# Patient Record
Sex: Male | Born: 1960 | Race: White | Hispanic: No | Marital: Married | Smoking: Never smoker
Health system: Southern US, Community
[De-identification: ages and names within clinical notes are randomized; demographics above are authoritative.]

## PROBLEM LIST (undated history)

## (undated) DIAGNOSIS — R319 Hematuria, unspecified: Secondary | ICD-10-CM

## (undated) DIAGNOSIS — G5602 Carpal tunnel syndrome, left upper limb: Secondary | ICD-10-CM

## (undated) DIAGNOSIS — Z9889 Other specified postprocedural states: Secondary | ICD-10-CM

## (undated) DIAGNOSIS — I77810 Thoracic aortic ectasia: Secondary | ICD-10-CM

## (undated) DIAGNOSIS — R112 Nausea with vomiting, unspecified: Secondary | ICD-10-CM

## (undated) DIAGNOSIS — K56609 Unspecified intestinal obstruction, unspecified as to partial versus complete obstruction: Secondary | ICD-10-CM

## (undated) DIAGNOSIS — T4145XA Adverse effect of unspecified anesthetic, initial encounter: Secondary | ICD-10-CM

## (undated) DIAGNOSIS — Z98811 Dental restoration status: Secondary | ICD-10-CM

## (undated) DIAGNOSIS — G4730 Sleep apnea, unspecified: Secondary | ICD-10-CM

## (undated) DIAGNOSIS — T8859XA Other complications of anesthesia, initial encounter: Secondary | ICD-10-CM

## (undated) DIAGNOSIS — N400 Enlarged prostate without lower urinary tract symptoms: Secondary | ICD-10-CM

## (undated) DIAGNOSIS — Z8719 Personal history of other diseases of the digestive system: Secondary | ICD-10-CM

## (undated) DIAGNOSIS — R51 Headache: Secondary | ICD-10-CM

## (undated) DIAGNOSIS — K219 Gastro-esophageal reflux disease without esophagitis: Secondary | ICD-10-CM

## (undated) DIAGNOSIS — I719 Aortic aneurysm of unspecified site, without rupture: Secondary | ICD-10-CM

## (undated) DIAGNOSIS — R079 Chest pain, unspecified: Secondary | ICD-10-CM

## (undated) HISTORY — DX: Unspecified intestinal obstruction, unspecified as to partial versus complete obstruction: K56.609

## (undated) HISTORY — DX: Chest pain, unspecified: R07.9

## (undated) HISTORY — PX: CHOLECYSTECTOMY: SHX55

## (undated) HISTORY — PX: SHOULDER SURGERY: SHX246

## (undated) HISTORY — PX: APPENDECTOMY: SHX54

## (undated) HISTORY — DX: Thoracic aortic ectasia: I77.810

---

## 1999-10-29 ENCOUNTER — Ambulatory Visit (HOSPITAL_COMMUNITY): Admission: RE | Admit: 1999-10-29 | Discharge: 1999-10-30 | Payer: Self-pay | Admitting: Neurological Surgery

## 1999-10-29 ENCOUNTER — Encounter: Payer: Self-pay | Admitting: Neurological Surgery

## 1999-10-29 HISTORY — PX: LUMBAR LAMINECTOMY/DECOMPRESSION MICRODISCECTOMY: SHX5026

## 1999-11-27 ENCOUNTER — Encounter: Admission: RE | Admit: 1999-11-27 | Discharge: 1999-11-27 | Payer: Self-pay | Admitting: Neurological Surgery

## 1999-11-27 ENCOUNTER — Encounter: Payer: Self-pay | Admitting: Neurological Surgery

## 1999-12-05 ENCOUNTER — Ambulatory Visit (HOSPITAL_COMMUNITY): Admission: RE | Admit: 1999-12-05 | Discharge: 1999-12-05 | Payer: Self-pay | Admitting: Neurological Surgery

## 1999-12-05 ENCOUNTER — Encounter: Payer: Self-pay | Admitting: Neurological Surgery

## 2001-07-07 ENCOUNTER — Encounter (INDEPENDENT_AMBULATORY_CARE_PROVIDER_SITE_OTHER): Payer: Self-pay | Admitting: *Deleted

## 2001-07-07 ENCOUNTER — Ambulatory Visit (HOSPITAL_BASED_OUTPATIENT_CLINIC_OR_DEPARTMENT_OTHER): Admission: RE | Admit: 2001-07-07 | Discharge: 2001-07-07 | Payer: Self-pay | Admitting: Otolaryngology

## 2001-07-07 HISTORY — PX: DIRECT LARYNGOSCOPY: SHX5326

## 2001-12-15 ENCOUNTER — Ambulatory Visit (HOSPITAL_COMMUNITY): Admission: RE | Admit: 2001-12-15 | Discharge: 2001-12-15 | Payer: Self-pay | Admitting: Orthopedic Surgery

## 2001-12-15 ENCOUNTER — Encounter: Payer: Self-pay | Admitting: Orthopedic Surgery

## 2006-11-23 ENCOUNTER — Ambulatory Visit: Payer: Self-pay | Admitting: Family Medicine

## 2010-10-16 NOTE — Op Note (Signed)
[place]. [facility]  Patient:    Jonathan Burke, Jonathan Burke Visit Number: [id number] MRN: [id number]          Service Type: DSU Location: [facility] Attending Physician:  Lucky Cowboy Dictated by:   Lucky Cowboy, M.D. Proc. Date: 07/07/01 Admit Date:  07/07/2001   CC:         [facility]  Colon Flattery, D.O., [place], [place]   Operative Report  PREOPERATIVE DIAGNOSIS: Left vocal cord mass.  POSTOPERATIVE DIAGNOSIS: Left vocal cord mass.  OPERATION/PROCEDURE: Suspension microdirect laryngoscopy with excision of left vocal cord mass.  SURGEON: Lucky Cowboy, M.D.  ANESTHESIA: General endotracheal anesthesia.  ESTIMATED BLOOD LOSS/COMPLICATIONS: None.  INDICATIONS FOR PROCEDURE: This patient is a 50 year old male, who has experienced significant hoarseness for seven months.  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  After reflux was controlled with Nexium bed, excision is performed.  FINDINGS: The patient was noted to have a moderately sized left anterior vocal cord, hemorrhagic polyp which had overlying leukoplakia.  This was very broad-based.  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  DESCRIPTION OF PROCEDURE: The patient was taken to the operating room and placed on the table in the supine position.  He was then placed under general endotracheal anesthesia and the table rotated counterclockwise 90 degrees. The neck was gently extended using a shoulder roll.  The eyes were taped shut and the head and body draped.  A Dedo adult laryngoscope was then placed intraorally with exposure of the endolarynx.  This was then suspended on the Lewy rigid suspension system.  The microscope was then aimed down the barrel of the laryngoscope for better visualization.  Epinephrine on a cottonoid pledget was placed against the left vocal cord.  At this point, the  polyp was grasped with a forceps and reflected medially.  A knife was then used to incise the mucosa adjacent to the polypoid mass.  Anteriorly, scissors were required to resect the last small portion of mucosa.  A small amount of bleeding occurred which was controlled using epinephrine on a pledget.  The laryngoscope was removed.  Please note that the patient had lidocaine after induction prior to intubation.  The table was rotated clockwise 90 degrees to its original position.  There was no damage to the teeth or soft tissues. Please note that an upper dental guard was used for protection of the incisors.  The patient was awakened from anesthesia and taken to the post anesthesia care unit in stable condition.  There were no complications. Dictated by:   Lucky Cowboy, M.D. Attending Physician:  Lucky Cowboy DD:  07/07/01 TD:  07/07/01 Job: [id number]

## 2010-10-16 NOTE — Discharge Summary (Signed)
[place]. [facility]  Patient:    Jonathan Burke, Jonathan Burke                       MRN: [id number] Adm. Date:  [id number] Disc. Date: [id number] Attending:  Jonne Ply                           Discharge Summary  ADMITTING DIAGNOSIS:  Herniated nucleus pulposus L5-S1 right with right lumbar radiculopathy.  DISCHARGE DIAGNOSIS:  Herniated nucleus pulposus L5-S1 right with right lumbar radiculopathy.  OPERATION:  Lumbar microdiskectomy L5-S1 right with microdiskectomy using microdissection technique.  CONDITION ON DISCHARGE:  Improved.  HOSPITAL COURSE:  The patient is a 50 year old individual who has had severe back and right lower extremity pain.  He had a herniated nucleus pulposus demonstrated on a myelogram done on the day of admission.  He was advised regarding surgery and was taken to the operating room on the evening of Oct 29, 1999.  He underwent laminotomy and microdiskectomy, tolerated the procedure well, and his leg pain is greatly relieved.  His incision is clean and dry, and he has been advised as to his postoperative activities.  He will be seen in the office in about a months time.  He was given a prescription for Vicodin for pain and Xanax as a muscle relaxer. DD:  10/30/99 TD:  11/02/99 Job: [id number]

## 2010-10-16 NOTE — Op Note (Signed)
[place]. [facility]  Patient:    Jonathan Burke, Jonathan Burke                       MRN: [id number] Proc. Date: 10/29/99 Adm. Date:  [id number] Disc. Date: [id number] Attending:  Jonne Ply                           Operative Report  PREOPERATIVE DIAGNOSIS:  Herniated nucleus pulposus of L5-S1, right with right lumbar radiculopathy.  POSTOPERATIVE DIAGNOSIS:  Herniated nucleus pulposus of L5-S1, right with right lumbar radiculopathy.  PROCEDURE:  Lumbar laminotomy and microdiskectomy with operating microscope and microdissection technique.  SURGEON:  Stefani Dama, M.D.  ANESTHESIA:  General endotracheal.  INDICATIONS:  The patient is a 50 year old right-handed individual who has had over ten weeks of back and right lower extremity pain that has been excruciating and not getting any better.  An MRI had previously demonstrated a broad-based bulge at the L5-S1 level.  He has failed extensive effort at conservative management and a myelogram confirmed the presence of what appears to be a herniated nucleus pulposus at the L5-S1 level, eccentric to the right side.  He is taken to the operating room.  DESCRIPTION OF PROCEDURE:  The patient was brought to the operating room supine on the stretcher.  After smooth induction of general endotracheal anesthesia, he was turned prone.  The back was shaved, prepped with DuraPrep, and draped in a sterile fashion.  A midline incision was created and carried down to the lumbodorsal fascia overlying the L5-S1 disk space.  The lumbodorsal fascia was opened on the right side and an intramuscular dissection was performed down to the interlaminar space at L5-S1.  The muscle was cleared from the inferior margin and lamina of L5 out to the mesial wall of the facet.  Self-retaining retractor was placed in the wound.  A laminotomy was created, removing the inferior margin and lamina of L5 out to the mesial wall of the facet with  a Midas Rex and an AM3 bur.  A combination of curets and rongeurs were then used to remove portions of the inferior margin and lamina, opening up the laminotomy site.  The yellow ligament was removed in its lateral aspect.  The common dural tube and the S1 nerve root was identified.  This was _______ ventrally and laterally with gentle dissection using the operating microscope and microdissection technique.  Epidural veins were cauterized, divided, and the S1 nerve root was mobilized and retracted medially.  Underlying this, then the disk space was explored and there was noted to be a portion of disk protruding from under the posterior longitudinal ligament at the inferior aspect of this area, just underneath the S1 nerve root.  This area was teased opened and several fragments of disk were removed from the subligamentous space.  Several more fragments were teased out by opening the rent in the posterior longitudinal ligament even further.  In the end, a moderate amount of disk was removed from this area.  This area was contiguous with the disk space and a 2 mm pituitary rongeur was used to evacuate some moderate amount of degenerated disk material from within the disk space proper.  Once this was completed, the area was inspected carefully.  A small piece of overgrown ligament from the superior margin of the body of S1 was removed with an osteophyte tool.  This allowed a smooth egress of  the S1 nerve root.  The area thus being well-decompressed, it was copiously irrigated with antibiotic irrigating solution.  Hemostasis was doubly checked and the microscope was removed.  The lumbodorsal fascia was then closed with #1 Vicryl in an interrupted fashion.  2-0 Vicryl was used subcutaneously and subcuticularly.  The patient tolerated the procedure well.  Blood loss was estimated at less than 50 cc. DD:  10/29/99 TD:  11/03/99 Job: [id number]

## 2010-10-16 NOTE — H&P (Signed)
[place]. [facility]  Patient:    Jonathan Burke, Jonathan Burke                       MRN: [id number] Adm. Date:  [id number] Attending:  Jonne Ply                         History and Physical  ADMITTING DIAGNOSIS: Herniated nucleus pulposus, L5, S1 with right S1 radiculopathy.  HISTORY OF PRESENT ILLNESS: Patient is a 50 year old individual who has had significant back and right leg pain for a period of about 10 weeks.  He has not had any relief despite trials of steroid injections, physical therapy manipulation, pain medication.  Patient notes that the pain is severe down the right lower extremity, cannot get relief in any given position.  He has been using increasing doses of narcotic medication and was seen in the office yesterday.  An MRI from April was reviewed and this revealed a presence of a disc protrusion at L5, S1 level ecentric to the right side.  He is now being taken to the operating room after undergoing myelogram earlier today, which demonstrates the presence of a herniated disc at L5, S1, ecentric to the right side.  PAST MEDICAL HISTORY: Reveals his general health has been very good.  He denies any significant medical problems of surgery in the past.  CURRENT MEDICATIONS: He uses no medication chronically.  ALLERGIES: No allergies to medications.  SOCIAL HISTORY: Reveals that he is married.  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  He does not smoke and he does not drink alcohol.  FAMILY HISTORY: Negative for any significant medical problems such as diabetes, high blood pressure, liver, heart, or kidney disease.  SYSTEMS REVIEW: Notable for only the items in History of Present Illness. Fourteen point review was carried out with the patient.  PHYSICAL EXAMINATION: Reveals that he is an alert, oriented, cooperative individual in no overt distress.  BACK: While lying on the gurney, he moves about very slowly and tends to turn his right  buttock cheek propped.  He will stand straight and erect with a great deal of difficulty and there is a moderate amount of paravertebral spasm on the right side of his paralumbar musculature. Motor strength in the lower extremities reveals iliopsoas, quadriceps, tibialis anterior and and gastrocs to have good, strength and tone and bulk to confrontational testing.  Deep tendon reflexes are 2+ in the patellae, absent in the right Achilles, trace in the left Achilles.  Babinskis downgoing.  Sensation is intact to pin and light touch in the distal lower extremities.  In the upper extremities strength and reflexes are normal.  NEUROLOGIC: Cranial nerve examination reveals the pupils are 4 mm briskly active to light and accommodation.  The extraocular movements are full.  Face is symmetric to grimace.  Tongue and uvula are in the midline.  Sclera and conjunctiva are clear.  LUNGS: Clear to auscultation.  HEART: Regular rate and rhythm.  ABDOMEN: Soft.  Bowel sounds positive.  No masses are palpable.  EXTREMITIES: Reveal no cyanosis, clubbing or edema.  IMPRESSION: The patient has evidence of herniated nucleus pulposus at L5, S1. He is now being admitted to undergo surgical extirpation of the disc. DD:  10/29/99 TD:  10/29/99 Job: [id number]

## 2011-08-30 DIAGNOSIS — G5602 Carpal tunnel syndrome, left upper limb: Secondary | ICD-10-CM

## 2011-08-30 HISTORY — DX: Carpal tunnel syndrome, left upper limb: G56.02

## 2011-09-02 ENCOUNTER — Other Ambulatory Visit: Payer: Self-pay | Admitting: Orthopedic Surgery

## 2011-09-06 ENCOUNTER — Encounter (HOSPITAL_BASED_OUTPATIENT_CLINIC_OR_DEPARTMENT_OTHER): Payer: Self-pay | Admitting: *Deleted

## 2011-09-08 ENCOUNTER — Encounter (HOSPITAL_BASED_OUTPATIENT_CLINIC_OR_DEPARTMENT_OTHER): Payer: Self-pay | Admitting: *Deleted

## 2011-09-08 NOTE — H&P (Signed)
  Jonathan Burke is an 51 y.o. male.   Chief Complaint: c/o a chronic history of bilateral hand numbness. HPI: .  He has never had nerve conduction studies.  He has night splinted off and on for the past three years.  Jonathan Burke has developed very significant bilateral hand numbness, left worse than right.  He is now experiencing burning pain on the left.  He seeks an upper extremity orthopaedic consult.    Past Medical History  Diagnosis Date  . Headache     tension  . Dental crowns present     also caps  . 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 GERD (gastroesophageal reflux disease)     daily OTC  . Complication of anesthesia     states is hard to wake up    Past Surgical History  Procedure Date  . Direct laryngoscopy 07/07/2001    suspension microdirect laryngoscopy with exc. left vocal cord mass  . Lumbar laminectomy/decompression microdiscectomy 10/29/1999    L5-S1  . Shoulder surgery     left    History reviewed. No pertinent family history. Social History:  reports that he has never smoked. He has never used smokeless tobacco. He reports that he does not drink alcohol or use illicit drugs.  Allergies:  Allergies  Allergen Reactions  . Adhesive (Tape) Other (See Comments)    PULLS SKIN OFF    No current facility-administered medications on file as of .   Medications Prior to Admission  Medication Sig Dispense Refill  . omeprazole (PRILOSEC) 20 MG capsule Take 20 mg by mouth daily.        No results found for this or any previous visit (from the past 48 hour(s)).  No results found.   Pertinent items are noted in HPI.  Height 5\' 8"  (1.727 m), weight 98.884 kg (218 lb).  General appearance: alert Head: Normocephalic, without obvious abnormality Neck: supple, symmetrical, trachea midline Resp: clear to auscultation bilaterally Cardio: regular rate and rhythm, S1, S2 normal, no murmur, click, rub or gallop GI: normal findings: bowel sounds  normal Extremities:.  Inspection of his hands reveals no thenar atrophy.  His sweat patterns are preserved.  His dermatoglyphics are preserved. He has full range of motion of his fingers in flexion/extension.  His Tinel's sign are equivocal.  He has diminished sensibility in the median distribution bilaterally.  His ulnar sensibility is preserved.  He has no neck complaints.  He has full range of motion of his shoulders, elbows, forearms and wrists.  There is no sign of stenosing tenosynovitis.  Dr. Johna Roles performed screening electrodiagnostic studies.  These confirm significant bilateral carpal tunnel syndrome determined by Dr. Johna Roles to be moderately severe bilaterally Pulses: 2+ and symmetric Skin: mobility and turgor normal Neurologic: Grossly normal    Assessment/Plan Impression: Bilateral CTS  PLAN:   After informed consent he is scheduled for left carpal tunnel release.  I would recommend that he proceed with decompression of his left median nerve in the near term followed in three to four weeks by release of the right transverse carpal ligament .  The surgery and aftercare were described in detail.   DASNOIT,Caitlynn Ju J 09/08/2011, 9:16 PM   H&P documentation: 09/09/2011  -History and Physical Reviewed  -Patient has been re-examined  -No change in the plan of care  Wyn Forster, MD

## 2011-09-09 ENCOUNTER — Encounter (HOSPITAL_BASED_OUTPATIENT_CLINIC_OR_DEPARTMENT_OTHER)
Admission: RE | Disposition: A | Discharge status: Home / Self Care | Payer: Self-pay | Source: Ambulatory Visit | Attending: Orthopedic Surgery

## 2011-09-09 ENCOUNTER — Encounter (HOSPITAL_BASED_OUTPATIENT_CLINIC_OR_DEPARTMENT_OTHER): Payer: Self-pay | Admitting: Certified Registered Nurse Anesthetist

## 2011-09-09 ENCOUNTER — Encounter (HOSPITAL_BASED_OUTPATIENT_CLINIC_OR_DEPARTMENT_OTHER): Payer: Self-pay | Admitting: *Deleted

## 2011-09-09 ENCOUNTER — Ambulatory Visit (HOSPITAL_BASED_OUTPATIENT_CLINIC_OR_DEPARTMENT_OTHER): Payer: BC Managed Care – PPO | Admitting: Certified Registered Nurse Anesthetist

## 2011-09-09 ENCOUNTER — Ambulatory Visit (HOSPITAL_BASED_OUTPATIENT_CLINIC_OR_DEPARTMENT_OTHER)
Admission: RE | Admit: 2011-09-09 | Discharge: 2011-09-09 | Disposition: A | Discharge status: Home / Self Care | Payer: BC Managed Care – PPO | Source: Ambulatory Visit | Attending: Orthopedic Surgery | Admitting: Orthopedic Surgery

## 2011-09-09 DIAGNOSIS — G5600 Carpal tunnel syndrome, unspecified upper limb: Secondary | ICD-10-CM | POA: Insufficient documentation

## 2011-09-09 DIAGNOSIS — K219 Gastro-esophageal reflux disease without esophagitis: Secondary | ICD-10-CM | POA: Insufficient documentation

## 2011-09-09 HISTORY — DX: Headache: R51

## 2011-09-09 HISTORY — DX: Adverse effect of unspecified anesthetic, initial encounter: T41.45XA

## 2011-09-09 HISTORY — PX: CARPAL TUNNEL RELEASE: SHX101

## 2011-09-09 HISTORY — DX: Carpal tunnel syndrome, left upper limb: G56.02

## 2011-09-09 HISTORY — DX: Dental restoration status: Z98.811

## 2011-09-09 HISTORY — DX: Gastro-esophageal reflux disease without esophagitis: K21.9

## 2011-09-09 HISTORY — DX: Other complications of anesthesia, initial encounter: T88.59XA

## 2011-09-09 SURGERY — CARPAL TUNNEL RELEASE
Anesthesia: General | Site: Wrist | Laterality: Left | Wound class: Clean

## 2011-09-09 MED ORDER — FENTANYL CITRATE 0.05 MG/ML IJ SOLN
INTRAMUSCULAR | Status: DC | PRN
  Administered 2011-09-09: 50 ug via INTRAVENOUS
  Administered 2011-09-09: 25 ug via INTRAVENOUS

## 2011-09-09 MED ORDER — FENTANYL CITRATE 0.05 MG/ML IJ SOLN: 25.0000 ug | INTRAMUSCULAR | Status: DC | PRN

## 2011-09-09 MED ORDER — DROPERIDOL 2.5 MG/ML IJ SOLN
INTRAMUSCULAR | Status: DC | PRN
  Administered 2011-09-09: 0.625 mg via INTRAVENOUS

## 2011-09-09 MED ORDER — MIDAZOLAM HCL 5 MG/5ML IJ SOLN
INTRAMUSCULAR | Status: DC | PRN
  Administered 2011-09-09: 1 mg via INTRAVENOUS

## 2011-09-09 MED ORDER — ONDANSETRON HCL 4 MG/2ML IJ SOLN
INTRAMUSCULAR | Status: DC | PRN
  Administered 2011-09-09: 4 mg via INTRAVENOUS

## 2011-09-09 MED ORDER — MORPHINE SULFATE 4 MG/ML IJ SOLN: 0.0500 mg/kg | INTRAMUSCULAR | Status: DC | PRN

## 2011-09-09 MED ORDER — LIDOCAINE HCL (CARDIAC) 20 MG/ML IV SOLN
INTRAVENOUS | Status: DC | PRN
  Administered 2011-09-09: 60 mg via INTRAVENOUS

## 2011-09-09 MED ORDER — CEFAZOLIN SODIUM 1-5 GM-% IV SOLN: 1.0000 g | INTRAVENOUS | Status: DC

## 2011-09-09 MED ORDER — LIDOCAINE HCL 2 % IJ SOLN
INTRAMUSCULAR | Status: DC | PRN
  Administered 2011-09-09: 5 mL

## 2011-09-09 MED ORDER — LACTATED RINGERS IV SOLN
INTRAVENOUS | Status: DC
  Administered 2011-09-09 (×2): via INTRAVENOUS

## 2011-09-09 MED ORDER — HYDROCODONE-ACETAMINOPHEN 5-325 MG PO TABS: ORAL_TABLET | ORAL | Status: AC

## 2011-09-09 MED ORDER — DEXAMETHASONE SODIUM PHOSPHATE 10 MG/ML IJ SOLN
INTRAMUSCULAR | Status: DC | PRN
  Administered 2011-09-09: 10 mg via INTRAVENOUS

## 2011-09-09 MED ORDER — CEFAZOLIN SODIUM-DEXTROSE 2-3 GM-% IV SOLR: 2.0000 g | INTRAVENOUS | Status: DC

## 2011-09-09 MED ORDER — CHLORHEXIDINE GLUCONATE 4 % EX LIQD: 60.0000 mL | Freq: Once | CUTANEOUS | Status: DC

## 2011-09-09 MED ORDER — METOCLOPRAMIDE HCL 5 MG/ML IJ SOLN: 10.0000 mg | Freq: Once | INTRAMUSCULAR | Status: DC | PRN

## 2011-09-09 MED ORDER — PROPOFOL 10 MG/ML IV EMUL
INTRAVENOUS | Status: DC | PRN
  Administered 2011-09-09: 250 mg via INTRAVENOUS

## 2011-09-09 SURGICAL SUPPLY — 42 items
BANDAGE ADHESIVE 1X3 (GAUZE/BANDAGES/DRESSINGS) IMPLANT
BANDAGE ELASTIC 3 VELCRO ST LF (GAUZE/BANDAGES/DRESSINGS) ×2 IMPLANT
BLADE SURG 15 STRL LF DISP TIS (BLADE) ×1 IMPLANT
BLADE SURG 15 STRL SS (BLADE) ×2
BNDG CMPR 9X4 STRL LF SNTH (GAUZE/BANDAGES/DRESSINGS) ×1
BNDG ESMARK 4X9 LF (GAUZE/BANDAGES/DRESSINGS) ×1 IMPLANT
BRUSH SCRUB EZ PLAIN DRY (MISCELLANEOUS) ×2 IMPLANT
CLOTH BEACON ORANGE TIMEOUT ST (SAFETY) ×2 IMPLANT
CORDS BIPOLAR (ELECTRODE) ×1 IMPLANT
COVER MAYO STAND STRL (DRAPES) ×2 IMPLANT
COVER TABLE BACK 60X90 (DRAPES) ×2 IMPLANT
CUFF TOURNIQUET SINGLE 18IN (TOURNIQUET CUFF) ×1 IMPLANT
DECANTER SPIKE VIAL GLASS SM (MISCELLANEOUS) IMPLANT
DRAPE EXTREMITY T 121X128X90 (DRAPE) ×2 IMPLANT
DRAPE SURG 17X23 STRL (DRAPES) ×2 IMPLANT
GLOVE BIO SURGEON STRL SZ 6.5 (GLOVE) ×2 IMPLANT
GLOVE BIOGEL M STRL SZ7.5 (GLOVE) ×2 IMPLANT
GLOVE BIOGEL PI IND STRL 6.5 (GLOVE) IMPLANT
GLOVE BIOGEL PI INDICATOR 6.5 (GLOVE) ×1
GLOVE EXAM NITRILE PF MED BLUE (GLOVE) ×1 IMPLANT
GLOVE ORTHO TXT STRL SZ7.5 (GLOVE) ×2 IMPLANT
GOWN BRE IMP PREV XXLGXLNG (GOWN DISPOSABLE) ×2 IMPLANT
GOWN PREVENTION PLUS XLARGE (GOWN DISPOSABLE) ×3 IMPLANT
NDL SAFETY ECLIPSE 18X1.5 (NEEDLE) IMPLANT
NEEDLE 27GAX1X1/2 (NEEDLE) ×1 IMPLANT
NEEDLE HYPO 18GX1.5 SHARP (NEEDLE) ×2
PACK BASIN DAY SURGERY FS (CUSTOM PROCEDURE TRAY) ×2 IMPLANT
PAD CAST 3X4 CTTN HI CHSV (CAST SUPPLIES) ×1 IMPLANT
PADDING CAST ABS 4INX4YD NS (CAST SUPPLIES)
PADDING CAST ABS COTTON 4X4 ST (CAST SUPPLIES) ×1 IMPLANT
PADDING CAST COTTON 3X4 STRL (CAST SUPPLIES) ×2
SPLINT PLASTER CAST XFAST 3X15 (CAST SUPPLIES) ×5 IMPLANT
SPLINT PLASTER XTRA FASTSET 3X (CAST SUPPLIES) ×5
SPONGE GAUZE 4X4 12PLY (GAUZE/BANDAGES/DRESSINGS) ×1 IMPLANT
STOCKINETTE 4X48 STRL (DRAPES) ×2 IMPLANT
STRIP CLOSURE SKIN 1/2X4 (GAUZE/BANDAGES/DRESSINGS) ×2 IMPLANT
SUT PROLENE 3 0 PS 2 (SUTURE) ×2 IMPLANT
SYR 3ML 23GX1 SAFETY (SYRINGE) IMPLANT
SYR CONTROL 10ML LL (SYRINGE) ×1 IMPLANT
TRAY DSU PREP LF (CUSTOM PROCEDURE TRAY) ×2 IMPLANT
UNDERPAD 30X30 INCONTINENT (UNDERPADS AND DIAPERS) ×2 IMPLANT
WATER STERILE IRR 1000ML POUR (IV SOLUTION) ×1 IMPLANT

## 2011-09-09 NOTE — Discharge Instructions (Signed)

## 2011-09-09 NOTE — Op Note (Signed)
Op note dictated [id number]

## 2011-09-09 NOTE — Transfer of Care (Signed)
Immediate Anesthesia Transfer of Care Note  Patient: Jonathan Burke  Procedure(s) Performed: Procedure(s) (LRB): CARPAL TUNNEL RELEASE (Left)  Patient Location: PACU  Anesthesia Type: General  Level of Consciousness: awake and patient cooperative  Airway & Oxygen Therapy: Patient Spontanous Breathing and Patient connected to face mask oxygen  Post-op Assessment: Report given to PACU RN and Post -op Vital signs reviewed and stable  Post vital signs: Reviewed and stable  Complications: No apparent anesthesia complications

## 2011-09-09 NOTE — Op Note (Signed)
NAME:  Jonathan Burke, Jonathan Burke                   ACCOUNT NO.:  MEDICAL RECORD NO.:  [id number]  LOCATION:                                 FACILITY:  PHYSICIAN:  Katy Fitch. Esiquio Boesen, M.D.      DATE OF BIRTH:  DATE OF PROCEDURE:  09/09/2011 DATE OF DISCHARGE:                              OPERATIVE REPORT   PREOPERATIVE DIAGNOSIS:  Chronic median entrapment neuropathy, left wrist.  POSTOPERATIVE DIAGNOSIS:  Chronic median entrapment neuropathy, left wrist.  OPERATIONS:  Release of left transverse carpal ligament.  OPERATING SURGEON:  Katy Fitch. Iverna Hammac, M.D.  ASSISTANT:  Jonni Sanger, P.A.  ANESTHESIA:  General by LMA.  SUPERVISING ANESTHESIOLOGIST:  Janetta Hora. Gelene Mink, M.D.  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 Vernon Prey of Medicine [facility] for evaluation and management of chronic left hand numbness. Jonathan Burke had been advised years ago by Dr. Arletha Grippe of Jonathan Burke that he had carpal tunnel syndrome.  He had injections in the triad splinting. He had had persistent numbness.  He saw [place] for consult, was sent to Dr. Johna Roles for electrodiagnostic studies.  These revealed evidence of significant left carpal tunnel syndrome.  After informed consent, he was brought to the operating room at this time for release of the left transverse carpal ligament.  Preoperatively, he was advised of potential risks and benefits of the surgery.  Questions were invited and answered in detail.  PROCEDURE:  Jonathan Burke was brought to room 2 of the [facility] and placed supine position on the operating table.  Following induction of general anesthesia by LMA technique, the left arm was prepped with Betadine soap and solution, sterilely draped.  A pneumatic tourniquet was applied proximal left brachium.  Upon exsanguination of the left arm with Esmarch bandage, arterial tourniquet was inflated to 250 mmHg.   Procedure commenced with short incision in the line of the ring finger of the palm.  Subcutaneous tissues were carefully divided over palmar fascia.  The split longitudinally to common sensory branch to the median nerve.  These were followed back to the transverse carpal ligament which was gently isolated from median nerve.  A Penfield 4 elevator was used to clear pathway superficial to the median nerve, followed by release of the volar forearm fascia.  This widely opened carpal canal.  The tenosynovium of the ulnar bursa was quite fibrotic.  No mass or other predicaments were noted.  Bleeding points along margin of the released ligament were electrocauterized bipolar current followed by repair of the skin with intradermal 3-0 Prolene.  Compressive dressing was supplied with a volar plaster splint maintaining the wrist in 10 degrees of dorsiflexion.  For aftercare, Jonathan Burke is provided a prescription for Vicodin 5 mg 1 p.o. q.4-6 hours p.r.n. pain, 20 tablets without refill.     Katy Fitch Roby Donaway, M.D.     RVS/MEDQ  D:  09/09/2011  T:  09/09/2011  Job:  [id number]

## 2011-09-09 NOTE — Anesthesia Postprocedure Evaluation (Signed)
Anesthesia Post Note  Patient: Jonathan Burke  Procedure(s) Performed: Procedure(s) (LRB): CARPAL TUNNEL RELEASE (Left)  Anesthesia type: General  Patient location: PACU  Post pain: Pain level controlled  Post assessment: Patient's Cardiovascular Status Stable  Last Vitals:  Filed Vitals:   09/09/11 0922  BP: 108/76  Pulse: 64  Temp: 36.4 C  Resp: 16    Post vital signs: Reviewed and stable  Level of consciousness: alert  Complications: No apparent anesthesia complications

## 2011-09-09 NOTE — Brief Op Note (Signed)
09/09/2011  8:11 AM  PATIENT:  Jonathan Burke  51 y.o. male  PRE-OPERATIVE DIAGNOSIS:  left carpal tunnel syndrome  POST-OPERATIVE DIAGNOSIS:  left carpal tunnel syndrome  PROCEDURE:  CARPAL TUNNEL RELEASE (Left)  SURGEON:   Wyn Forster., MD   PHYSICIAN ASSISTANT:   ASSISTANTS:Francisco Ostrovsky Dasnoit,P.A-C     ANESTHESIA:   general  EBL:  Total I/O In: 1000 [I.V.:1000] Out: -   BLOOD ADMINISTERED:none  DRAINS: none   LOCAL MEDICATIONS USED:  LIDOCAINE   SPECIMEN:  No Specimen  DISPOSITION OF SPECIMEN:  N/A  COUNTS:  YES  TOURNIQUET:   Total Tourniquet Time Documented: Upper Arm (Left) - 10 minutes  DICTATION: .Other Dictation: Dictation Number [id number]  PLAN OF CARE: Discharge to home after PACU  PATIENT DISPOSITION:  PACU - hemodynamically stable.

## 2011-09-09 NOTE — Anesthesia Procedure Notes (Signed)
Procedure Name: LMA Insertion Date/Time: 09/09/2011 7:47 AM Performed by: Osceola Holian D Pre-anesthesia Checklist: Patient identified, Emergency Drugs available, Suction available and Patient being monitored Patient Re-evaluated:Patient Re-evaluated prior to inductionOxygen Delivery Method: Circle System Utilized Preoxygenation: Pre-oxygenation with 100% oxygen Intubation Type: IV induction Ventilation: Mask ventilation without difficulty LMA: LMA with gastric port inserted LMA Size: 4.0 Number of attempts: 1 Placement Confirmation: positive ETCO2 Tube secured with: Tape Dental Injury: Teeth and Oropharynx as per pre-operative assessment

## 2011-09-09 NOTE — Anesthesia Preprocedure Evaluation (Signed)
Anesthesia Evaluation  Patient identified by MRN, date of birth, ID band Patient awake    Reviewed: Allergy & Precautions, H&P , NPO status , Patient's Chart, lab work & pertinent test results, reviewed documented beta blocker date and time   History of Anesthesia Complications (+) PROLONGED EMERGENCE  Airway Mallampati: II TM Distance: >3 FB Neck ROM: full    Dental   Pulmonary neg pulmonary ROS,          Cardiovascular negative cardio ROS      Neuro/Psych  Headaches, negative psych ROS   GI/Hepatic negative GI ROS, Neg liver ROS, GERD-  Medicated and Controlled,  Endo/Other  negative endocrine ROS  Renal/GU negative Renal ROS  negative genitourinary   Musculoskeletal   Abdominal   Peds  Hematology negative hematology ROS (+)   Anesthesia Other Findings See surgeon's H&P   Reproductive/Obstetrics negative OB ROS                           Anesthesia Physical Anesthesia Plan  ASA: II  Anesthesia Plan: General   Post-op Pain Management:    Induction: Intravenous  Airway Management Planned: LMA  Additional Equipment:   Intra-op Plan:   Post-operative Plan: Extubation in OR  Informed Consent: I have reviewed the patients History and Physical, chart, labs and discussed the procedure including the risks, benefits and alternatives for the proposed anesthesia with the patient or authorized representative who has indicated his/her understanding and acceptance.     Plan Discussed with: CRNA and Surgeon  Anesthesia Plan Comments:         Anesthesia Quick Evaluation

## 2011-09-10 ENCOUNTER — Encounter (HOSPITAL_BASED_OUTPATIENT_CLINIC_OR_DEPARTMENT_OTHER): Payer: Self-pay | Admitting: Orthopedic Surgery

## 2012-07-17 ENCOUNTER — Inpatient Hospital Stay (HOSPITAL_COMMUNITY)
Admission: EM | Admit: 2012-07-17 | Discharge: 2012-07-20 | DRG: 181 | Disposition: A | Discharge status: Home / Self Care | Payer: BC Managed Care – PPO | Attending: Surgery | Admitting: Surgery

## 2012-07-17 ENCOUNTER — Emergency Department (HOSPITAL_COMMUNITY): Payer: BC Managed Care – PPO

## 2012-07-17 ENCOUNTER — Encounter (HOSPITAL_COMMUNITY): Payer: Self-pay

## 2012-07-17 DIAGNOSIS — K56609 Unspecified intestinal obstruction, unspecified as to partial versus complete obstruction: Secondary | ICD-10-CM

## 2012-07-17 DIAGNOSIS — R748 Abnormal levels of other serum enzymes: Secondary | ICD-10-CM

## 2012-07-17 DIAGNOSIS — K219 Gastro-esophageal reflux disease without esophagitis: Secondary | ICD-10-CM | POA: Diagnosis present

## 2012-07-17 DIAGNOSIS — K56600 Partial intestinal obstruction, unspecified as to cause: Secondary | ICD-10-CM

## 2012-07-17 DIAGNOSIS — Z79899 Other long term (current) drug therapy: Secondary | ICD-10-CM

## 2012-07-17 DIAGNOSIS — E669 Obesity, unspecified: Secondary | ICD-10-CM | POA: Diagnosis present

## 2012-07-17 DIAGNOSIS — Z9089 Acquired absence of other organs: Secondary | ICD-10-CM

## 2012-07-17 DIAGNOSIS — Z6831 Body mass index (BMI) 31.0-31.9, adult: Secondary | ICD-10-CM

## 2012-07-17 DIAGNOSIS — K210 Gastro-esophageal reflux disease with esophagitis: Secondary | ICD-10-CM

## 2012-07-17 DIAGNOSIS — Z981 Arthrodesis status: Secondary | ICD-10-CM

## 2012-07-17 DIAGNOSIS — G5600 Carpal tunnel syndrome, unspecified upper limb: Secondary | ICD-10-CM | POA: Diagnosis present

## 2012-07-17 HISTORY — DX: Unspecified intestinal obstruction, unspecified as to partial versus complete obstruction: K56.609

## 2012-07-17 LAB — CBC WITH DIFFERENTIAL/PLATELET
Basophils Absolute: 0 10*3/uL (ref 0.0–0.1)
Basophils Relative: 0 % (ref 0–1)
Eosinophils Absolute: 0.1 10*3/uL (ref 0.0–0.7)
HCT: 46.7 % (ref 39.0–52.0)
Hemoglobin: 16.3 g/dL (ref 13.0–17.0)
Lymphocytes Relative: 21 % (ref 12–46)
MCV: 89.6 fL (ref 78.0–100.0)
RBC: 5.21 MIL/uL (ref 4.22–5.81)
WBC: 8.3 10*3/uL (ref 4.0–10.5)

## 2012-07-17 LAB — URINALYSIS, ROUTINE W REFLEX MICROSCOPIC
Glucose, UA: NEGATIVE mg/dL
Leukocytes, UA: NEGATIVE
Nitrite: NEGATIVE
Specific Gravity, Urine: 1.03 (ref 1.005–1.030)
pH: 5.5 (ref 5.0–8.0)

## 2012-07-17 LAB — COMPREHENSIVE METABOLIC PANEL
AST: 13 U/L (ref 0–37)
Albumin: 4.1 g/dL (ref 3.5–5.2)
Alkaline Phosphatase: 44 U/L (ref 39–117)
Chloride: 101 mEq/L (ref 96–112)
Creatinine, Ser: 0.89 mg/dL (ref 0.50–1.35)
Potassium: 3.7 mEq/L (ref 3.5–5.1)
Total Bilirubin: 0.8 mg/dL (ref 0.3–1.2)
Total Protein: 8.1 g/dL (ref 6.0–8.3)

## 2012-07-17 LAB — URINE MICROSCOPIC-ADD ON

## 2012-07-17 LAB — LIPASE, BLOOD: Lipase: 251 U/L — ABNORMAL HIGH (ref 11–59)

## 2012-07-17 MED ORDER — HYDROMORPHONE HCL PF 1 MG/ML IJ SOLN
0.5000 mg | INTRAMUSCULAR | Status: DC | PRN
  Administered 2012-07-17 – 2012-07-18 (×2): 1 mg via INTRAVENOUS
  Filled 2012-07-17 (×2): qty 1

## 2012-07-17 MED ORDER — KCL IN DEXTROSE-NACL 40-5-0.9 MEQ/L-%-% IV SOLN
INTRAVENOUS | Status: DC
  Administered 2012-07-18 (×2): via INTRAVENOUS
  Administered 2012-07-18: 125 mL/h via INTRAVENOUS
  Administered 2012-07-19 (×2): via INTRAVENOUS
  Filled 2012-07-17 (×9): qty 1000

## 2012-07-17 MED ORDER — ALUM & MAG HYDROXIDE-SIMETH 200-200-20 MG/5ML PO SUSP: 30.0000 mL | Freq: Four times a day (QID) | ORAL | Status: DC | PRN

## 2012-07-17 MED ORDER — ONDANSETRON HCL 4 MG/2ML IJ SOLN: 4.0000 mg | Freq: Four times a day (QID) | INTRAMUSCULAR | Status: DC | PRN

## 2012-07-17 MED ORDER — HEPARIN SODIUM (PORCINE) 5000 UNIT/ML IJ SOLN
5000.0000 [IU] | Freq: Three times a day (TID) | INTRAMUSCULAR | Status: DC
  Administered 2012-07-18 – 2012-07-20 (×8): 5000 [IU] via SUBCUTANEOUS
  Filled 2012-07-17 (×10): qty 1

## 2012-07-17 MED ORDER — PANTOPRAZOLE SODIUM 40 MG IV SOLR
40.0000 mg | Freq: Every day | INTRAVENOUS | Status: DC
  Administered 2012-07-18 – 2012-07-19 (×3): 40 mg via INTRAVENOUS
  Filled 2012-07-17 (×4): qty 40

## 2012-07-17 MED ORDER — PHENOL 1.4 % MT LIQD
2.0000 | OROMUCOSAL | Status: DC | PRN
  Filled 2012-07-17: qty 177

## 2012-07-17 MED ORDER — PROMETHAZINE HCL 25 MG/ML IJ SOLN
12.5000 mg | Freq: Four times a day (QID) | INTRAMUSCULAR | Status: DC | PRN
  Administered 2012-07-18 – 2012-07-19 (×2): 25 mg via INTRAVENOUS
  Filled 2012-07-17 (×3): qty 1

## 2012-07-17 MED ORDER — MORPHINE SULFATE 4 MG/ML IJ SOLN
4.0000 mg | Freq: Once | INTRAMUSCULAR | Status: AC
  Administered 2012-07-17: 4 mg via INTRAVENOUS
  Filled 2012-07-17: qty 1

## 2012-07-17 MED ORDER — SODIUM CHLORIDE 0.9 % IV SOLN
Freq: Once | INTRAVENOUS | Status: AC
  Administered 2012-07-17: 19:00:00 via INTRAVENOUS

## 2012-07-17 MED ORDER — IOHEXOL 300 MG/ML  SOLN
50.0000 mL | Freq: Once | INTRAMUSCULAR | Status: AC | PRN
  Administered 2012-07-17: 50 mL via ORAL

## 2012-07-17 MED ORDER — ONDANSETRON HCL 4 MG/2ML IJ SOLN
4.0000 mg | Freq: Once | INTRAMUSCULAR | Status: AC
  Administered 2012-07-17: 4 mg via INTRAVENOUS
  Filled 2012-07-17: qty 2

## 2012-07-17 MED ORDER — LACTATED RINGERS IV BOLUS (SEPSIS)
1000.0000 mL | Freq: Once | INTRAVENOUS | Status: AC
  Administered 2012-07-17: 1000 mL via INTRAVENOUS

## 2012-07-17 MED ORDER — DIPHENHYDRAMINE HCL 50 MG/ML IJ SOLN: 12.5000 mg | Freq: Four times a day (QID) | INTRAMUSCULAR | Status: DC | PRN

## 2012-07-17 MED ORDER — LIP MEDEX EX OINT
1.0000 "application " | TOPICAL_OINTMENT | Freq: Two times a day (BID) | CUTANEOUS | Status: DC
  Administered 2012-07-18 – 2012-07-20 (×4): 1 via TOPICAL
  Filled 2012-07-17 (×2): qty 7

## 2012-07-17 MED ORDER — ACETAMINOPHEN 650 MG RE SUPP: 650.0000 mg | Freq: Four times a day (QID) | RECTAL | Status: DC | PRN

## 2012-07-17 MED ORDER — MENTHOL 3 MG MT LOZG
1.0000 | LOZENGE | OROMUCOSAL | Status: DC | PRN
Start: 2012-07-17 — End: 2012-07-20

## 2012-07-17 MED ORDER — ONDANSETRON 8 MG/NS 50 ML IVPB
8.0000 mg | Freq: Four times a day (QID) | INTRAVENOUS | Status: DC | PRN
  Filled 2012-07-17: qty 8

## 2012-07-17 MED ORDER — IOHEXOL 300 MG/ML  SOLN
100.0000 mL | Freq: Once | INTRAMUSCULAR | Status: AC | PRN
  Administered 2012-07-17: 100 mL via INTRAVENOUS

## 2012-07-17 MED ORDER — BISACODYL 10 MG RE SUPP: 10.0000 mg | Freq: Two times a day (BID) | RECTAL | Status: DC | PRN

## 2012-07-17 MED ORDER — MAGIC MOUTHWASH
15.0000 mL | Freq: Four times a day (QID) | ORAL | Status: DC | PRN
  Filled 2012-07-17: qty 15

## 2012-07-17 NOTE — ED Provider Notes (Signed)
History     CSN: [id number]  Arrival date & time 07/17/12  1535   First MD Initiated Contact with Patient 07/17/12 1758      Chief Complaint  Patient presents with  . Diarrhea  . Abdominal Pain    (Consider location/radiation/quality/duration/timing/severity/associated sxs/prior treatment) HPI Jonathan Burke is a 52 y.o. male who presents with complaint of nausea, vomiting, abdominal pain, diarrhea, abdominal distention for 4 days. States started with nausea, intermittent. Unable to eat or drink anything. Belching. Loose stools. No bowel movement today, not passing gas since this morning. Was seen by his PCP today, had KUB done, which showed dilated colon, sent here to r/o obstruction. Pt states pain is crampy, all over abdomen, abdomen more distended than usual. Denies pain with urination. No fever, chills, malaise.  Past Medical History  Diagnosis Date  . Headache     tension  . Dental crowns present     also caps  . 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 GERD (gastroesophageal reflux disease)     daily OTC  . Complication of anesthesia     states is hard to wake up    Past Surgical History  Procedure Laterality Date  . 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 Lumbar laminectomy/decompression microdiscectomy  10/29/1999    L5-S1  . Shoulder surgery      left  . Carpal tunnel release  09/09/2011    Procedure: CARPAL TUNNEL RELEASE;  Surgeon: Wyn Forster., MD;  Location: [facility];  Service: Orthopedics;  Laterality: Left;  . Cholecystectomy    . Appendectomy      History reviewed. No pertinent family history.  History  Substance Use Topics  . Smoking status: Never Smoker   . Smokeless tobacco: Never Used  . Alcohol Use: No      Review of Systems  Constitutional: Negative for chills and fatigue.  Respiratory: Negative.   Cardiovascular: Negative.   Gastrointestinal:  Positive for nausea, vomiting, abdominal pain, diarrhea and abdominal distention.  Neurological: Negative for weakness and headaches.  All other systems reviewed and are negative.    Allergies  Adhesive  Home Medications   Current Outpatient Rx  Name  Route  Sig  Dispense  Refill  . omeprazole (PRILOSEC) 20 MG capsule   Oral   Take 20 mg by mouth daily.         . tadalafil (CIALIS) 5 MG tablet   Oral   Take 5 mg by mouth daily as needed for erectile dysfunction.           BP 119/65  Pulse 68  Temp(Src) 97.9 F (36.6 C) (Oral)  Resp 16  SpO2 99%  Physical Exam  Nursing note and vitals reviewed. Constitutional: He is oriented to person, place, and time. He appears well-nourished. No distress.  HENT:  Head: Normocephalic.  Eyes: Conjunctivae are normal. No scleral icterus.  Neck: Neck supple.  Cardiovascular: Normal rate, regular rhythm and normal heart sounds.   Pulmonary/Chest: Effort normal. No respiratory distress. He has no wheezes. He has no rales.  Abdominal: Soft. He exhibits distension. There is tenderness. There is no rebound and no guarding.  Hyperactive bowel sounds. Tenderness diffuse  Musculoskeletal: He exhibits no edema.  Neurological: He is alert and oriented to person, place, and time.  Skin: Skin is warm and dry.  Psychiatric: He has a normal mood and affect. His behavior is  normal.    ED Course  Procedures (including critical care time)  Pt with nausea, vomiting, diarrhea. No BM since this morning. Positive bowel sounds on exam. Abdomen non surgical. Will get labs, acute abdominal series.  Results for orders placed during the hospital encounter of 07/17/12  CBC WITH DIFFERENTIAL      Result Value Range   WBC 8.3  4.0 - 10.5 K/uL   RBC 5.21  4.22 - 5.81 MIL/uL   Hemoglobin 16.3  13.0 - 17.0 g/dL   HCT 56.2  13.0 - 86.5 %   MCV 89.6  78.0 - 100.0 fL   MCH 31.3  26.0 - 34.0 pg   MCHC 34.9  30.0 - 36.0 g/dL   RDW 78.4  69.6 - 29.5 %    Platelets 316  150 - 400 K/uL   Neutrophils Relative 68  43 - 77 %   Neutro Abs 5.6  1.7 - 7.7 K/uL   Lymphocytes Relative 21  12 - 46 %   Lymphs Abs 1.7  0.7 - 4.0 K/uL   Monocytes Relative 10  3 - 12 %   Monocytes Absolute 0.9  0.1 - 1.0 K/uL   Eosinophils Relative 1  0 - 5 %   Eosinophils Absolute 0.1  0.0 - 0.7 K/uL   Basophils Relative 0  0 - 1 %   Basophils Absolute 0.0  0.0 - 0.1 K/uL  COMPREHENSIVE METABOLIC PANEL      Result Value Range   Sodium 138  135 - 145 mEq/L   Potassium 3.7  3.5 - 5.1 mEq/L   Chloride 101  96 - 112 mEq/L   CO2 28  19 - 32 mEq/L   Glucose, Bld 102 (*) 70 - 99 mg/dL   BUN 21  6 - 23 mg/dL   Creatinine, Ser 2.84  0.50 - 1.35 mg/dL   Calcium 8.9  8.4 - 13.2 mg/dL   Total Protein 8.1  6.0 - 8.3 g/dL   Albumin 4.1  3.5 - 5.2 g/dL   AST 13  0 - 37 U/L   ALT 23  0 - 53 U/L   Alkaline Phosphatase 44  39 - 117 U/L   Total Bilirubin 0.8  0.3 - 1.2 mg/dL   GFR calc non Af Amer >90  >90 mL/min   GFR calc Af Amer >90  >90 mL/min  LIPASE, BLOOD      Result Value Range   Lipase 251 (*) 11 - 59 U/L  URINALYSIS, ROUTINE W REFLEX MICROSCOPIC      Result Value Range   Color, Urine AMBER (*) YELLOW   APPearance CLOUDY (*) CLEAR   Specific Gravity, Urine 1.030  1.005 - 1.030   pH 5.5  5.0 - 8.0   Glucose, UA NEGATIVE  NEGATIVE mg/dL   Hgb urine dipstick LARGE (*) NEGATIVE   Bilirubin Urine SMALL (*) NEGATIVE   Ketones, ur TRACE (*) NEGATIVE mg/dL   Protein, ur NEGATIVE  NEGATIVE mg/dL   Urobilinogen, UA 1.0  0.0 - 1.0 mg/dL   Nitrite NEGATIVE  NEGATIVE   Leukocytes, UA NEGATIVE  NEGATIVE  URINE MICROSCOPIC-ADD ON      Result Value Range   WBC, UA 0-2  <3 WBC/hpf   RBC / HPF 3-6  <3 RBC/hpf   Bacteria, UA MANY (*) RARE   Urine-Other AMORPHOUS URATES/PHOSPHATES       Dg Abd Acute W/chest  07/17/2012  *RADIOLOGY REPORT*  Clinical Data: Distended abdomen.  Rule out obstruction.  ACUTE  ABDOMEN SERIES (ABDOMEN 2 VIEW & CHEST 1 VIEW)  Comparison: KUB from  earlier today  Findings: Progressive small bowel dilatation.  Numerous loops of dilated small bowel are now present.  Colon is decompressed.  There are air-fluid levels in the small bowel on the upright view.  No free fluid. Prior cholecystectomy.  The lungs are clear without infiltrate or effusion or heart failure.  IMPRESSION: Significant progression of small bowel obstruction since earlier today.   Original Report Authenticated By: Janeece Riggers, M.D.     CT abdomen ordered for further evaluation.   Ct Abdomen Pelvis W Contrast  07/17/2012  *RADIOLOGY REPORT*  Clinical Data: Diarrhea, abdominal pain  CT ABDOMEN AND PELVIS WITH CONTRAST  Technique:  Multidetector CT imaging of the abdomen and pelvis was performed following the standard protocol during bolus administration of intravenous contrast.  Contrast: OMNIPAQUE IOHEXOL 300 MG/ML  SOLN, 50mL OMNIPAQUE IOHEXOL 300 MG/ML  SOLN  Comparison: Recent prior CT scan abdomen/pelvis 07/07/2012  Findings:  Lower Chest:  The lung bases are clear.  Visualized cardiac structures within normal limits for size.  No pericardial effusion. Unremarkable thoracic esophagus.  Abdomen: Unremarkable CT appearance of the stomach, duodenum, pain, adrenal glands, pancreas, and kidneys.  No evidence of hydronephrosis or nephrolithiasis.  Diffuse low attenuation of the hepatic parenchyma consistent with hepatic steatosis.  Surgical changes of prior cholecystectomy.  No significant intra or extrahepatic biliary ductal dilatation.  Multiple loops of distended and fluid-filled small bowel. There are several focal transition areas to normal-caliber small bowel followed by recurrent dilatation distally.  Several loops of bowel are acutely angulated. No free fluid or suspicious adenopathy. Surgical changes of prior appendectomy.  There are a few scattered colonic diverticula without evidence of active inflammation.  Pelvis: Unremarkable bladder and seminal vesicles.  Coarse prostatic  calcifications.  No free fluid or suspicious adenopathy.  Bones: No acute osseous abnormality.  Lower lumbar degenerative disc disease.  Vascular: No significant atherosclerotic vascular disease.  IMPRESSION:  1.  CT findings most consistent with partial small bowel obstruction.  There are at least three focal transition points with acute angulation of the bowel.  Differential considerations include multi focal areas of small bowel narrowing as can be seen with inflammatory bowel disease such as Crohn's disease and postsurgical adhesive disease adhesive disease.  2.  Additional ancillary findings as above without significant interval change compared to 12/17/2012.   Original Report Authenticated By: Malachy Moan, M.D.     1. Partial small bowel obstruction   2. Elevated lipase       MDM  Pt with SBO, confirmed by CT scan. Pain minimal at this time. NG tube ordered. Pt is comfortable. Spoke with Dr. Michaell Cowing, will admit pt to surgical team. UA appears infected, cultures sent. Pt asymptomatic.   Filed Vitals:   07/17/12 1924  BP: 144/72  Pulse: 75  Temp:   Resp: 16           Bexlee Bergdoll A Kelcy Baeten, PA 07/17/12 2248

## 2012-07-17 NOTE — ED Notes (Addendum)
Patient c/o abdominal pain and diarrhea. Patient was sent by Dr. [name] due to an intestinal obstruction.  Patient was sent for further evaluation and treatment. Abdomen hard and distended

## 2012-07-17 NOTE — H&P (Signed)
Jonathan Burke  [birth year] [id number]  CARE TEAM:  PCP: Rudi Heap, MD  Outpatient Care Team: Patient Care Team: Ernestina Penna, MD as PCP - General (Family Medicine)  Inpatient Treatment Team: Treatment Team: Attending Provider: Vida Roller, MD; Physician Assistant: Lottie Mussel, PA; Technician: Richardean Chimera, EMT; Registered Nurse: Danella Maiers, RN; Consulting Physician: Bishop Limbo, MD  This patient is a 52 y.o.male who presents today for surgical evaluation at the request of Lottie Mussel, PA.   Reason for evaluation: Small bowel instruction  Obese male with prior cholecystectomy and appendectomy.  Has had intermittent episodes of crampy abdominal pain and nausea vomiting.  He had a colonoscopy last year by Dr. Matthias Hughs with [facility] gastroenterology.  Normal per him.  Family with room.  Four days ago, he had worsening abdominal pain and nausea vomiting.  He normally has a bowel movement twice a day.  No sick contacts or travel history.  No diarrhea.  Worsening crampy pain.  Had x-rays done earlier today concerning for bowel structure.  Came emergency room.  X-rays and CT scan concerning for bowel obstruction.  Surgical consultation requested.  No personal nor family history of GI/colon cancer, inflammatory bowel disease, irritable bowel syndrome, allergy such as Celiac Sprue, dietary/dairy problems, colitis, ulcers nor gastritis.  No recent sick contacts/gastroenteritis.  Question of GERD treated with PPI intermittently.  This does not feel like that.  No travel outside the country.  No changes in diet.  Patient walks 60 minutes for about 1-2 miles without difficulty.  No exertional chest/neck/shoulder/arm pain.   Past Medical History  Diagnosis Date  . Headache     tension  . Dental crowns present     also caps  . 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 GERD (gastroesophageal reflux disease)     daily OTC  . Complication of anesthesia    states is hard to wake up    Past Surgical History  Procedure Laterality Date  . 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 Lumbar laminectomy/decompression microdiscectomy  10/29/1999    L5-S1  . Shoulder surgery      left  . Carpal tunnel release  09/09/2011    Procedure: CARPAL TUNNEL RELEASE;  Surgeon: Wyn Forster., MD;  Location: [facility];  Service: Orthopedics;  Laterality: Left;  . Cholecystectomy    . Appendectomy      History   Social History  . Marital Status: Married    Spouse Name: N/A    Number of Children: N/A  . Years of Education: N/A   Occupational History  . Not on file.   Social History Main Topics  . Smoking status: Never Smoker   . Smokeless tobacco: Never Used  . Alcohol Use: No  . Drug Use: No  . Sexually Active: Not on file   Other Topics Concern  . Not on file   Social History Narrative  . No narrative on file    History reviewed. No pertinent family history.  Current Facility-Administered Medications  Medication Dose Route Frequency Provider Last Rate Last Dose  . acetaminophen (TYLENOL) suppository 650 mg  650 mg Rectal Q6H PRN Ardeth Sportsman, MD      . alum & mag hydroxide-simeth (MAALOX/MYLANTA) 200-200-20 MG/5ML suspension 30 mL  30 mL Oral Q6H PRN Ardeth Sportsman, MD      . bisacodyl (DULCOLAX) suppository 10  mg  10 mg Rectal Q12H PRN Ardeth Sportsman, MD      . dextrose 5 % and 0.9 % NaCl with KCl 40 mEq/L infusion   Intravenous Continuous Ardeth Sportsman, MD      . diphenhydrAMINE (BENADRYL) injection 12.5-25 mg  12.5-25 mg Intravenous Q6H PRN Ardeth Sportsman, MD      . HYDROmorphone (DILAUDID) injection 0.5-2 mg  0.5-2 mg Intravenous Q30 min PRN Ardeth Sportsman, MD      . lactated ringers bolus 1,000 mL  1,000 mL Intravenous Once Ardeth Sportsman, MD      . lip balm (CARMEX) ointment 1 application  1 application Topical BID Ardeth Sportsman, MD      .  magic mouthwash  15 mL Oral QID PRN Ardeth Sportsman, MD      . menthol-cetylpyridinium (CEPACOL) lozenge 3 mg  1 lozenge Oral PRN Ardeth Sportsman, MD      . ondansetron (ZOFRAN) injection 4-8 mg  4-8 mg Intravenous Q6H PRN Ardeth Sportsman, MD      . phenol (CHLORASEPTIC) mouth spray 2 spray  2 spray Mouth/Throat PRN Ardeth Sportsman, MD      . promethazine (PHENERGAN) injection 12.5-25 mg  12.5-25 mg Intravenous Q6H PRN Ardeth Sportsman, MD       Current Outpatient Prescriptions  Medication Sig Dispense Refill  . omeprazole (PRILOSEC) 20 MG capsule Take 20 mg by mouth daily.      . tadalafil (CIALIS) 5 MG tablet Take 5 mg by mouth daily as needed for erectile dysfunction.         Allergies  Allergen Reactions  . Adhesive (Tape) Other (See Comments)    PULLS SKIN OFF    ROS: Constitutional:  No fevers, chills, sweats.  Weight stable Eyes:  No vision changes, No discharge HENT:  No sore throats, nasal drainage Lymph: No neck swelling, No bruising easily Pulmonary:  No cough, productive sputum CV: No orthopnea, PND  Patient walks 60 minutes for about 1-2 miles without difficulty.  No exertional chest/neck/shoulder/arm pain. GI: No personal nor family history of GI/colon cancer, inflammatory bowel disease, irritable bowel syndrome, allergy such as Celiac Sprue, dietary/dairy problems, colitis, ulcers nor gastritis.  No recent sick contacts/gastroenteritis.  No travel outside the country.  No changes in diet. Renal: No UTIs, No hematuria Genital:  No drainage, bleeding, masses Musculoskeletal: No severe joint pain.  Good ROM major joints Skin:  No sores or lesions.  No rashes Heme/Lymph:  No easy bleeding.  No swollen lymph nodes Neuro: No focal weakness/numbness.  No seizures Psych: No suicidal ideation.  No hallucinations  BP 144/72  Pulse 75  Temp(Src) 97.9 F (36.6 C) (Oral)  Resp 16  SpO2 96%  Physical Exam: General: Pt awake/alert/oriented x4 in mild discomfort but no major  acute distress Eyes: PERRL, normal EOM. Sclera nonicteric Neuro: CN II-XII intact w/o focal sensory/motor deficits. Lymph: No head/neck/groin lymphadenopathy Psych:  No delerium/psychosis/paranoia HENT: Normocephalic, Mucus membranes moist.  No thrush.  NGT in place.  in canister Neck: Supple, No tracheal deviation Chest: No pain.  Good respiratory excursion. CV:  Pulses intact.  Regular rhythm Abdomen: Mildly Soft but Moderatedly distended.  Obese.  Nontender.  No incarcerated hernias.  No peritonitis Ext:  SCDs BLE.  No significant edema.  No cyanosis Skin: No petechiae / purpurea.  No major sores Musculoskeletal: No severe joint pain.  Good ROM major joints   Results:   Labs: Results for  orders placed during the hospital encounter of 07/17/12 (from the past 48 hour(s))  CBC WITH DIFFERENTIAL     Status: None   Collection Time    07/17/12  6:33 PM      Result Value Range   WBC 8.3  4.0 - 10.5 K/uL   RBC 5.21  4.22 - 5.81 MIL/uL   Hemoglobin 16.3  13.0 - 17.0 g/dL   HCT 16.1  09.6 - 04.5 %   MCV 89.6  78.0 - 100.0 fL   MCH 31.3  26.0 - 34.0 pg   MCHC 34.9  30.0 - 36.0 g/dL   RDW 40.9  81.1 - 91.4 %   Platelets 316  150 - 400 K/uL   Neutrophils Relative 68  43 - 77 %   Neutro Abs 5.6  1.7 - 7.7 K/uL   Lymphocytes Relative 21  12 - 46 %   Lymphs Abs 1.7  0.7 - 4.0 K/uL   Monocytes Relative 10  3 - 12 %   Monocytes Absolute 0.9  0.1 - 1.0 K/uL   Eosinophils Relative 1  0 - 5 %   Eosinophils Absolute 0.1  0.0 - 0.7 K/uL   Basophils Relative 0  0 - 1 %   Basophils Absolute 0.0  0.0 - 0.1 K/uL  COMPREHENSIVE METABOLIC PANEL     Status: Abnormal   Collection Time    07/17/12  6:33 PM      Result Value Range   Sodium 138  135 - 145 mEq/L   Potassium 3.7  3.5 - 5.1 mEq/L   Chloride 101  96 - 112 mEq/L   CO2 28  19 - 32 mEq/L   Glucose, Bld 102 (*) 70 - 99 mg/dL   BUN 21  6 - 23 mg/dL   Creatinine, Ser 7.82  0.50 - 1.35 mg/dL   Calcium 8.9  8.4 - 95.6 mg/dL   Total  Protein 8.1  6.0 - 8.3 g/dL   Albumin 4.1  3.5 - 5.2 g/dL   AST 13  0 - 37 U/L   ALT 23  0 - 53 U/L   Alkaline Phosphatase 44  39 - 117 U/L   Total Bilirubin 0.8  0.3 - 1.2 mg/dL   GFR calc non Af Amer >90  >90 mL/min   GFR calc Af Amer >90  >90 mL/min   Comment:            The eGFR has been calculated     using the CKD EPI equation.     This calculation has not been     validated in all clinical     situations.     eGFR's persistently     <90 mL/min signify     possible Chronic Kidney Disease.  LIPASE, BLOOD     Status: Abnormal   Collection Time    07/17/12  6:33 PM      Result Value Range   Lipase 251 (*) 11 - 59 U/L  URINALYSIS, ROUTINE W REFLEX MICROSCOPIC     Status: Abnormal   Collection Time    07/17/12  7:33 PM      Result Value Range   Color, Urine AMBER (*) YELLOW   Comment: BIOCHEMICALS MAY BE AFFECTED BY COLOR   APPearance CLOUDY (*) CLEAR   Specific Gravity, Urine 1.030  1.005 - 1.030   pH 5.5  5.0 - 8.0   Glucose, UA NEGATIVE  NEGATIVE mg/dL   Hgb urine dipstick LARGE (*) NEGATIVE  Bilirubin Urine SMALL (*) NEGATIVE   Ketones, ur TRACE (*) NEGATIVE mg/dL   Protein, ur NEGATIVE  NEGATIVE mg/dL   Urobilinogen, UA 1.0  0.0 - 1.0 mg/dL   Nitrite NEGATIVE  NEGATIVE   Leukocytes, UA NEGATIVE  NEGATIVE  URINE MICROSCOPIC-ADD ON     Status: Abnormal   Collection Time    07/17/12  7:33 PM      Result Value Range   WBC, UA 0-2  <3 WBC/hpf   RBC / HPF 3-6  <3 RBC/hpf   Bacteria, UA MANY (*) RARE   Urine-Other AMORPHOUS URATES/PHOSPHATES     Comment: MUCOUS PRESENT    Imaging / Studies: Ct Abdomen Pelvis W Contrast  07/17/2012  *RADIOLOGY REPORT*  Clinical Data: Diarrhea, abdominal pain  CT ABDOMEN AND PELVIS WITH CONTRAST  Technique:  Multidetector CT imaging of the abdomen and pelvis was performed following the standard protocol during bolus administration of intravenous contrast.  Contrast: OMNIPAQUE IOHEXOL 300 MG/ML  SOLN, 50mL OMNIPAQUE IOHEXOL 300  MG/ML  SOLN  Comparison: Recent prior CT scan abdomen/pelvis 07/07/2012  Findings:  Lower Chest:  The lung bases are clear.  Visualized cardiac structures within normal limits for size.  No pericardial effusion. Unremarkable thoracic esophagus.  Abdomen: Unremarkable CT appearance of the stomach, duodenum, pain, adrenal glands, pancreas, and kidneys.  No evidence of hydronephrosis or nephrolithiasis.  Diffuse low attenuation of the hepatic parenchyma consistent with hepatic steatosis.  Surgical changes of prior cholecystectomy.  No significant intra or extrahepatic biliary ductal dilatation.  Multiple loops of distended and fluid-filled small bowel. There are several focal transition areas to normal-caliber small bowel followed by recurrent dilatation distally.  Several loops of bowel are acutely angulated. No free fluid or suspicious adenopathy. Surgical changes of prior appendectomy.  There are a few scattered colonic diverticula without evidence of active inflammation.  Pelvis: Unremarkable bladder and seminal vesicles.  Coarse prostatic calcifications.  No free fluid or suspicious adenopathy.  Bones: No acute osseous abnormality.  Lower lumbar degenerative disc disease.  Vascular: No significant atherosclerotic vascular disease.  IMPRESSION:  1.  CT findings most consistent with partial small bowel obstruction.  There are at least three focal transition points with acute angulation of the bowel.  Differential considerations include multi focal areas of small bowel narrowing as can be seen with inflammatory bowel disease such as Crohn's disease and postsurgical adhesive disease adhesive disease.  2.  Additional ancillary findings as above without significant interval change compared to 12/17/2012.   Original Report Authenticated By: Malachy Moan, M.D.    Dg Abd Acute W/chest  07/17/2012  *RADIOLOGY REPORT*  Clinical Data: Distended abdomen.  Rule out obstruction.  ACUTE ABDOMEN SERIES (ABDOMEN 2 VIEW &  CHEST 1 VIEW)  Comparison: KUB from earlier today  Findings: Progressive small bowel dilatation.  Numerous loops of dilated small bowel are now present.  Colon is decompressed.  There are air-fluid levels in the small bowel on the upright view.  No free fluid. Prior cholecystectomy.  The lungs are clear without infiltrate or effusion or heart failure.  IMPRESSION: Significant progression of small bowel obstruction since earlier today.   Original Report Authenticated By: Janeece Riggers, M.D.     Medications / Allergies: per chart  Antibiotics: Anti-infectives   None      Assessment  Jonathan Burke  52 y.o. male       Problem List:  Active Problems:   SBO (small bowel obstruction)   SBO vs atypical Crohn's disease  w/o h/o IBD & recent colonoscpy WNl Plan:  -admit -NGT -IVF -f/u Xrays -If worse or not better in 2 days, OR exploration:  The anatomy & physiology of the digestive tract was discussed.  The pathophysiology of intestinal obstruction was discussed.  Natural history risks without surgery was discussed.   I feel the patient has failed non-operative therapies.  The risks of no intervention will lead to serious problems such as necrosis, perforation, dehydration, etc. that outweigh the operative risks; therefore, I recommended abdominal exploration to diagnose & treat the source of the problem.  Laparoscopic & open techniques were discussed.   I expressed a good likelihood that surgery will treat the problem.  Risks such as bleeding, infection, abscess, leak, reoperation, bowel resection, possible ostomy, hernia, heart attack, death, and other risks were discussed.   I noted a good likelihood this will help address the problem.  Goals of post-operative recovery were discussed as well.  We will work to minimize complications. Questions were answered.  The patient expresses understanding & wishes to proceed with surgery if needed in a few days.  The patient is stable.  There is no  evidence of peritonitis, acute abdomen, nor shock.  There is no strong evidence of failure of improvement nor decline with current non-operative management.  There is no need for surgery at the present moment.  We will continue to follow.   -PPI for GERD -VTE prophylaxis- SCDs, etc -mobilize as tolerated to help recovery  I discussed the patient's status to the family.  Questions were answered.  They expressed understanding & appreciation.    Ardeth Sportsman, M.D., F.A.C.S. Gastrointestinal and Minimally Invasive Surgery [facility] [address] [place], [place] [address] [phone] Main / Paging [phone] Voice Mail   07/17/2012  CARE TEAM:  PCP: Rudi Heap, MD  Outpatient Care Team: Patient Care Team: Ernestina Penna, MD as PCP - General (Family Medicine) Florencia Reasons, MD as Consulting Physician (Gastroenterology)  Inpatient Treatment Team: Treatment Team: Attending Provider: Vida Roller, MD; Physician Assistant: Lottie Mussel, PA; Technician: Richardean Chimera, EMT; Registered Nurse: Danella Maiers, RN; Consulting Physician: Bishop Limbo, MD

## 2012-07-17 NOTE — ED Notes (Signed)
Pt given urinal for urine sample 

## 2012-07-18 ENCOUNTER — Inpatient Hospital Stay (HOSPITAL_COMMUNITY): Payer: BC Managed Care – PPO

## 2012-07-18 LAB — CBC
HCT: 40.9 % (ref 39.0–52.0)
MCH: 31.1 pg (ref 26.0–34.0)
MCV: 90.3 fL (ref 78.0–100.0)
Platelets: 260 10*3/uL (ref 150–400)
RDW: 13 % (ref 11.5–15.5)
WBC: 7.5 10*3/uL (ref 4.0–10.5)

## 2012-07-18 LAB — BASIC METABOLIC PANEL
BUN: 19 mg/dL (ref 6–23)
CO2: 28 mEq/L (ref 19–32)
Calcium: 8.2 mg/dL — ABNORMAL LOW (ref 8.4–10.5)
Chloride: 102 mEq/L (ref 96–112)
Creatinine, Ser: 0.9 mg/dL (ref 0.50–1.35)
Glucose, Bld: 109 mg/dL — ABNORMAL HIGH (ref 70–99)

## 2012-07-18 MED ORDER — BIOTENE DRY MOUTH MT LIQD
15.0000 mL | Freq: Two times a day (BID) | OROMUCOSAL | Status: DC
  Administered 2012-07-18 – 2012-07-20 (×4): 15 mL via OROMUCOSAL

## 2012-07-18 MED ORDER — CHLORHEXIDINE GLUCONATE 0.12 % MT SOLN
15.0000 mL | Freq: Two times a day (BID) | OROMUCOSAL | Status: DC
  Administered 2012-07-18 – 2012-07-20 (×3): 15 mL via OROMUCOSAL
  Filled 2012-07-18 (×7): qty 15

## 2012-07-18 NOTE — ED Provider Notes (Signed)
This 52 year old male with several abdominal surgical procedures in the past including an appendectomy, cholecystectomy and hernia repair. He presents with 4 days of intermittent but worsening abdominal pain associated with distention and nausea and vomiting.  On exam the patient is a distended tympanitic abdomen with mild diffuse tenderness, no guarding, no peritoneal signs and clear heart and lung sounds.  Assessment laboratory and x-ray data reveals that the patient has a small bowel obstruction, he will be admitted to the surgical service, Dr. Michaell Cowing has seen the patient.   Medical screening examination/treatment/procedure(s) were conducted as a shared visit with non-physician practitioner(s) and myself.  I personally evaluated the patient during the encounter    Vida Roller, MD 07/18/12 [phone]

## 2012-07-18 NOTE — Care Management Note (Signed)
    Page 1 of 1   07/18/2012     10:58:53 AM   CARE MANAGEMENT NOTE 07/18/2012  Patient:  Jonathan Burke, Jonathan Burke   Account Number:  [id number]  Date Initiated:  07/18/2012  Documentation initiated by:  Lorenda Ishihara  Subjective/Objective Assessment:   52 yo male admitted with SBO. PTA lived at home with spouse.     Action/Plan:   Home when stable   Anticipated DC Date:  07/21/2012   Anticipated DC Plan:  HOME/SELF CARE      DC Planning Services  CM consult      Choice offered to / List presented to:             Status of service:  Completed, signed off Medicare Important Message given?   (If response is "NO", the following Medicare IM given date fields will be blank) Date Medicare IM given:   Date Additional Medicare IM given:    Discharge Disposition:  HOME/SELF CARE  Per UR Regulation:  Reviewed for med. necessity/level of care/duration of stay  If discussed at Long Length of Stay Meetings, dates discussed:    Comments:

## 2012-07-18 NOTE — Progress Notes (Signed)
ATTENDING ADDENDUM:  I personally reviewed patient's record, examined the patient, and formulated the following assessment and plan:  Feels better, NG output bilious, abd distended- Cont NG to LWS, await return of bowel function

## 2012-07-18 NOTE — Progress Notes (Signed)
Subjective: Feels a little better, some flatus.  He's had episodes similar to this on and off for 6 months.  Relates eating and feeling bloated, and not eating up to 24 hours because he feels so bloated. He also relates loose stools frequently associated with these episodes.  Objective: Vital signs in last 24 hours: Temp:  [97.6 F (36.4 C)-97.9 F (36.6 C)] 97.6 F (36.4 C) (02/18 0539) Pulse Rate:  [58-75] 75 (02/18 0539) Resp:  [16-18] 18 (02/18 0539) BP: (106-144)/(63-73) 106/63 mmHg (02/18 0539) SpO2:  [91 %-99 %] 96 % (02/18 0539) Weight:  [208 lb (94.348 kg)] 208 lb (94.348 kg) (02/18 0240)   NPO, NG in to far, and looks like its kinked.  200 thru the NG.  There is some contrast in ascending colon today so some got thru, SB still shows some dilatation. Intake/Output from previous day: 02/17 0701 - 02/18 0700 In: 350 [I.V.:350] Out: 750 [Urine:550; Emesis/NG output:200] Intake/Output this shift:    General appearance: alert, cooperative and no distress Resp: clear to auscultation bilaterally GI: soft, mild distension, few BS, some flatus, no BM I adjusted the tube and there is some light blood tinged colored fluid coming thru NG now.  Hx of gerd  Lab Results:   Recent Labs  07/17/12 1833 07/18/12 0410  WBC 8.3 7.5  HGB 16.3 14.1  HCT 46.7 40.9  PLT 316 260    BMET  Recent Labs  07/17/12 1833 07/18/12 0410  NA 138 137  K 3.7 3.6  CL 101 102  CO2 28 28  GLUCOSE 102* 109*  BUN 21 19  CREATININE 0.89 0.90  CALCIUM 8.9 8.2*   PT/INR No results found for this basename: LABPROT, INR,  in the last 72 hours   Recent Labs Lab 07/17/12 1833  AST 13  ALT 23  ALKPHOS 44  BILITOT 0.8  PROT 8.1  ALBUMIN 4.1     Lipase     Component Value Date/Time   LIPASE 251* 07/17/2012 1833     Studies/Results: Ct Abdomen Pelvis W Contrast  07/17/2012  *RADIOLOGY REPORT*  Clinical Data: Diarrhea, abdominal pain  CT ABDOMEN AND PELVIS WITH CONTRAST  Technique:   Multidetector CT imaging of the abdomen and pelvis was performed following the standard protocol during bolus administration of intravenous contrast.  Contrast: OMNIPAQUE IOHEXOL 300 MG/ML  SOLN, 50mL OMNIPAQUE IOHEXOL 300 MG/ML  SOLN  Comparison: Recent prior CT scan abdomen/pelvis 07/07/2012  Findings:  Lower Chest:  The lung bases are clear.  Visualized cardiac structures within normal limits for size.  No pericardial effusion. Unremarkable thoracic esophagus.  Abdomen: Unremarkable CT appearance of the stomach, duodenum, pain, adrenal glands, pancreas, and kidneys.  No evidence of hydronephrosis or nephrolithiasis.  Diffuse low attenuation of the hepatic parenchyma consistent with hepatic steatosis.  Surgical changes of prior cholecystectomy.  No significant intra or extrahepatic biliary ductal dilatation.  Multiple loops of distended and fluid-filled small bowel. There are several focal transition areas to normal-caliber small bowel followed by recurrent dilatation distally.  Several loops of bowel are acutely angulated. No free fluid or suspicious adenopathy. Surgical changes of prior appendectomy.  There are a few scattered colonic diverticula without evidence of active inflammation.  Pelvis: Unremarkable bladder and seminal vesicles.  Coarse prostatic calcifications.  No free fluid or suspicious adenopathy.  Bones: No acute osseous abnormality.  Lower lumbar degenerative disc disease.  Vascular: No significant atherosclerotic vascular disease.  IMPRESSION:  1.  CT findings most consistent with partial  small bowel obstruction.  There are at least three focal transition points with acute angulation of the bowel.  Differential considerations include multi focal areas of small bowel narrowing as can be seen with inflammatory bowel disease such as Crohn's disease and postsurgical adhesive disease adhesive disease.  2.  Additional ancillary findings as above without significant interval change compared to  12/17/2012.   Original Report Authenticated By: Malachy Moan, M.D.    Dg Abd Acute W/chest  07/17/2012  *RADIOLOGY REPORT*  Clinical Data: Distended abdomen.  Rule out obstruction.  ACUTE ABDOMEN SERIES (ABDOMEN 2 VIEW & CHEST 1 VIEW)  Comparison: KUB from earlier today  Findings: Progressive small bowel dilatation.  Numerous loops of dilated small bowel are now present.  Colon is decompressed.  There are air-fluid levels in the small bowel on the upright view.  No free fluid. Prior cholecystectomy.  The lungs are clear without infiltrate or effusion or heart failure.  IMPRESSION: Significant progression of small bowel obstruction since earlier today.   Original Report Authenticated By: Janeece Riggers, M.D.    Dg Abd Portable 2v  07/18/2012  *RADIOLOGY REPORT*  Clinical Data: Follow-up small bowel obstruction.  PORTABLE ABDOMEN - 2 VIEW  Comparison: CT scan 07/17/2012.  Findings: Contrast from the CT scan does make it into the colon. The NG tube tip is in the second portion of the duodenum and should be retracted.  There are persistent dilated air filled small bowel loops with scattered air fluid levels.  No definite free air.  IMPRESSION:  1.  Probable partial small bowel obstruction. 2.  NG tube is in the duodenum and should be retracted 15 cm.   Original Report Authenticated By: Rudie Meyer, M.D.     Medications: . antiseptic oral rinse  15 mL Mouth Rinse q12n4p  . chlorhexidine  15 mL Mouth Rinse BID  . heparin  5,000 Units Subcutaneous Q8H  . lip balm  1 application Topical BID  . pantoprazole (PROTONIX) IV  40 mg Intravenous QHS   . dextrose 5 % and 0.9 % NaCl with KCl 40 mEq/L 125 mL/hr (07/18/12 0312)    Assessment/Plan SBO vs atypical Crohn's disease. Film today shows contrast in the colon. Hx of headaches GERD Carpal Tunnel syndrome   PLan:  Continue to drain with NG, mobilize, and watch repeat film in AM.  I move the NG some.  LOS: 1 day     Maryama Kuriakose 07/18/2012

## 2012-07-19 ENCOUNTER — Inpatient Hospital Stay (HOSPITAL_COMMUNITY): Payer: BC Managed Care – PPO

## 2012-07-19 LAB — URINE CULTURE

## 2012-07-19 NOTE — Progress Notes (Signed)
Subjective: Feels better, +BM this AM.  Some flatus.  Objective: Vital signs in last 24 hours: Temp:  [97.4 F (36.3 C)-98.6 F (37 C)] 98.6 F (37 C) (02/19 0610) Pulse Rate:  [60-75] 60 (02/19 0610) Resp:  [17-18] 18 (02/19 0610) BP: (102-129)/(69-85) 102/69 mmHg (02/19 0610) SpO2:  [96 %-98 %] 96 % (02/19 0610)   1300 ml from NG recorded yesterday, no ice recorded, film looks better this AM, he is afebrile, VSS, Intake/Output from previous day: 02/18 0701 - 02/19 0700 In: 3025 [I.V.:3025] Out: 2950 [Urine:1650; Emesis/NG output:1300] Intake/Output this shift:    General appearance: alert, cooperative and no distress GI: BS still pretty slow, distension a little better, +BM.    Lab Results:   Recent Labs  07/17/12 1833 07/18/12 0410  WBC 8.3 7.5  HGB 16.3 14.1  HCT 46.7 40.9  PLT 316 260    BMET  Recent Labs  07/17/12 1833 07/18/12 0410  NA 138 137  K 3.7 3.6  CL 101 102  CO2 28 28  GLUCOSE 102* 109*  BUN 21 19  CREATININE 0.89 0.90  CALCIUM 8.9 8.2*   PT/INR No results found for this basename: LABPROT, INR,  in the last 72 hours   Recent Labs Lab 07/17/12 1833  AST 13  ALT 23  ALKPHOS 44  BILITOT 0.8  PROT 8.1  ALBUMIN 4.1     Lipase     Component Value Date/Time   LIPASE 251* 07/17/2012 1833     Studies/Results: Ct Abdomen Pelvis W Contrast  07/17/2012  *RADIOLOGY REPORT*  Clinical Data: Diarrhea, abdominal pain  CT ABDOMEN AND PELVIS WITH CONTRAST  Technique:  Multidetector CT imaging of the abdomen and pelvis was performed following the standard protocol during bolus administration of intravenous contrast.  Contrast: OMNIPAQUE IOHEXOL 300 MG/ML  SOLN, 50mL OMNIPAQUE IOHEXOL 300 MG/ML  SOLN  Comparison: Recent prior CT scan abdomen/pelvis 07/07/2012  Findings:  Lower Chest:  The lung bases are clear.  Visualized cardiac structures within normal limits for size.  No pericardial effusion. Unremarkable thoracic esophagus.  Abdomen:  Unremarkable CT appearance of the stomach, duodenum, pain, adrenal glands, pancreas, and kidneys.  No evidence of hydronephrosis or nephrolithiasis.  Diffuse low attenuation of the hepatic parenchyma consistent with hepatic steatosis.  Surgical changes of prior cholecystectomy.  No significant intra or extrahepatic biliary ductal dilatation.  Multiple loops of distended and fluid-filled small bowel. There are several focal transition areas to normal-caliber small bowel followed by recurrent dilatation distally.  Several loops of bowel are acutely angulated. No free fluid or suspicious adenopathy. Surgical changes of prior appendectomy.  There are a few scattered colonic diverticula without evidence of active inflammation.  Pelvis: Unremarkable bladder and seminal vesicles.  Coarse prostatic calcifications.  No free fluid or suspicious adenopathy.  Bones: No acute osseous abnormality.  Lower lumbar degenerative disc disease.  Vascular: No significant atherosclerotic vascular disease.  IMPRESSION:  1.  CT findings most consistent with partial small bowel obstruction.  There are at least three focal transition points with acute angulation of the bowel.  Differential considerations include multi focal areas of small bowel narrowing as can be seen with inflammatory bowel disease such as Crohn's disease and postsurgical adhesive disease adhesive disease.  2.  Additional ancillary findings as above without significant interval change compared to 12/17/2012.   Original Report Authenticated By: Malachy Moan, M.D.    Dg Abd 2 Views  07/19/2012  *RADIOLOGY REPORT*  Clinical Data: Small bowel obstruction  ABDOMEN - 2 VIEW  Comparison: Abdomen films of 07/18/2012  Findings: Although some gaseous distention of small bowel does persist, the degree of gaseous distention has improved indicating some improvement in the partial small bowel obstruction.  The NG tube tip lies within the distal antrum - duodenal bulb region.  Contrast and air is noted within the nondistended colon.  IMPRESSION: Some improvement in partial small bowel obstruction.  NG tube tip in distal antrum - duodenal region.   Original Report Authenticated By: Dwyane Dee, M.D.    Dg Abd Acute W/chest  07/17/2012  *RADIOLOGY REPORT*  Clinical Data: Distended abdomen.  Rule out obstruction.  ACUTE ABDOMEN SERIES (ABDOMEN 2 VIEW & CHEST 1 VIEW)  Comparison: KUB from earlier today  Findings: Progressive small bowel dilatation.  Numerous loops of dilated small bowel are now present.  Colon is decompressed.  There are air-fluid levels in the small bowel on the upright view.  No free fluid. Prior cholecystectomy.  The lungs are clear without infiltrate or effusion or heart failure.  IMPRESSION: Significant progression of small bowel obstruction since earlier today.   Original Report Authenticated By: Janeece Riggers, M.D.    Dg Abd Portable 2v  07/18/2012  *RADIOLOGY REPORT*  Clinical Data: Follow-up small bowel obstruction.  PORTABLE ABDOMEN - 2 VIEW  Comparison: CT scan 07/17/2012.  Findings: Contrast from the CT scan does make it into the colon. The NG tube tip is in the second portion of the duodenum and should be retracted.  There are persistent dilated air filled small bowel loops with scattered air fluid levels.  No definite free air.  IMPRESSION:  1.  Probable partial small bowel obstruction. 2.  NG tube is in the duodenum and should be retracted 15 cm.   Original Report Authenticated By: Rudie Meyer, M.D.     Medications: . antiseptic oral rinse  15 mL Mouth Rinse q12n4p  . chlorhexidine  15 mL Mouth Rinse BID  . heparin  5,000 Units Subcutaneous Q8H  . lip balm  1 application Topical BID  . pantoprazole (PROTONIX) IV  40 mg Intravenous QHS    Assessment/Plan SBO vs atypical Crohn's disease. Hx of appendectomy and cholecystectomy. Film today shows contrast in the colon.  Hx of headaches  GERD  Carpal Tunnel syndrome   Plan:  Clamp NG, mobilize,  start some clears and see how he does with NG in.  It's still in to far, but will leave for now.  LOS: 2 days    Chestina Komatsu 07/19/2012

## 2012-07-19 NOTE — Progress Notes (Signed)
ATTENDING ADDENDUM:  I personally reviewed patient's record, examined the patient, and formulated the following assessment and plan:  Patient having bowel movements. Abdomen soft. NG tube removed. Patient will be started on clear liquids.

## 2012-07-20 NOTE — Progress Notes (Signed)
  Subjective: Having allot of loose stool.  Wants to eat, and go home if he can.  Objective: Vital signs in last 24 hours: Temp:  [97.9 F (36.6 C)-98.5 F (36.9 C)] 97.9 F (36.6 C) (02/20 0609) Pulse Rate:  [63-73] 63 (02/20 0609) Resp:  [16-18] 16 (02/20 0609) BP: (112-115)/(72-78) 115/78 mmHg (02/20 0609) SpO2:  [96 %] 96 % (02/20 0609) Last BM Date: 07/20/12 2 BM yesterday and one today so far recorded, afebrile, VSS,  Intake/Output from previous day: 08-17-22 0701 - 02/20 0700 In: 1952.1 [I.V.:1952.1] Out: 804 [Urine:500; Emesis/NG output:300; Stool:4] Intake/Output this shift: Total I/O In: 679.2 [P.O.:480; I.V.:199.2] Out: -   General appearance: alert, cooperative and no distress GI: soft, non-tender; bowel sounds normal; no masses,  no organomegaly  Lab Results:   Recent Labs  07/17/12 1833 07/18/12 0410  WBC 8.3 7.5  HGB 16.3 14.1  HCT 46.7 40.9  PLT 316 260    BMET  Recent Labs  07/17/12 1833 07/18/12 0410  NA 138 137  K 3.7 3.6  CL 101 102  CO2 28 28  GLUCOSE 102* 109*  BUN 21 19  CREATININE 0.89 0.90  CALCIUM 8.9 8.2*   PT/INR No results found for this basename: LABPROT, INR,  in the last 72 hours   Recent Labs Lab 07/17/12 1833  AST 13  ALT 23  ALKPHOS 44  BILITOT 0.8  PROT 8.1  ALBUMIN 4.1     Lipase     Component Value Date/Time   LIPASE 251* 07/17/2012 1833     Studies/Results: Dg Abd 2 Views  2012-08-17  *RADIOLOGY REPORT*  Clinical Data: Small bowel obstruction  ABDOMEN - 2 VIEW  Comparison: Abdomen films of 07/18/2012  Findings: Although some gaseous distention of small bowel does persist, the degree of gaseous distention has improved indicating some improvement in the partial small bowel obstruction.  The NG tube tip lies within the distal antrum - duodenal bulb region. Contrast and air is noted within the nondistended colon.  IMPRESSION: Some improvement in partial small bowel obstruction.  NG tube tip in distal antrum  - duodenal region.   Original Report Authenticated By: Dwyane Dee, M.D.     Medications: . antiseptic oral rinse  15 mL Mouth Rinse q12n4p  . chlorhexidine  15 mL Mouth Rinse BID  . heparin  5,000 Units Subcutaneous Q8H  . lip balm  1 application Topical BID  . pantoprazole (PROTONIX) IV  40 mg Intravenous QHS    Assessment/Plan SBO vs atypical Crohn's disease. Hx of appendectomy and cholecystectomy.  Film today shows contrast in the colon.  Hx of headaches  GERD  Carpal Tunnel syndrome   Plan:  Advance diet and see how he does, maybe home later today.  LOS: 3 days    Jonathan Burke 07/20/2012

## 2012-07-20 NOTE — Progress Notes (Signed)
ATTENDING ADDENDUM:  I personally reviewed patient's record, examined the patient, and formulated the following assessment and plan:  Doing well.  I think he is ready for d/c

## 2012-07-20 NOTE — Progress Notes (Signed)
SBO, resolved.  F/u prn.  Pt counseled that this could occur again in the future.

## 2012-07-20 NOTE — Progress Notes (Signed)
Assessment unchanged. Pt verbalized understanding of dc instructions. No scripts at discharge. Able to tell nurse when to follow up with MD and when to call if any recurrent problems. Dc'd via wheelchair to front entrance to meet awaiting vehicle to carry home. Accompanied by NT and wife.

## 2012-07-20 NOTE — Progress Notes (Signed)
Physician Discharge Summary  Patient ID: [name] MRN: [id number] DOB/AGE: [birth year] 52 y.o. Jonathan Heap, MD Admit date: 07/17/2012 Discharge date: 07/20/2012  Admission Diagnoses: SBO vs atypical Crohn's disease. Hx of appendectomy and cholecystectomy.    Discharge Diagnoses:  same Principal Problem:   SBO (small bowel obstruction) Active Problems:   GERD (gastroesophageal reflux disease)   Obesity (BMI 30-39.9)   PROCEDURES: None  Hospital Course:  Obese male with prior cholecystectomy and appendectomy. Has had intermittent episodes of crampy abdominal pain and nausea vomiting. He had a colonoscopy last year by Dr. Matthias Hughs with [facility] gastroenterology. Normal per him. Family with room. Four days ago, he had worsening abdominal pain and nausea vomiting. He normally has a bowel movement twice a day. No sick contacts or travel history. No diarrhea. Worsening crampy pain. Had x-rays done earlier today concerning for bowel structure. Came emergency room. X-rays and CT scan concerning for bowel obstruction. Surgical consultation requested. Pt was admitted by Dr. Michaell Cowing and placed on NG decompression, hydration and bowel rest.  It sounds like he has had lesser bouts of this before.Marland Kitchen  He has opened up and now is ready for discharge.  Condition on D/C:  Imporved          Disposition: 01-Home or Self Care     Medication List    TAKE these medications       omeprazole 20 MG capsule  Commonly known as:  PRILOSEC  Take 20 mg by mouth daily.     tadalafil 5 MG tablet  Commonly known as:  CIALIS  Take 5 mg by mouth daily as needed for erectile dysfunction.       Follow-up Information   Schedule an appointment as soon as possible for a visit to follow up. (As needed;  you dont need a surgeon till your stopped up and won't open.  Call if you have a problem.)       Follow up with Jonathan Heap, MD. (Call for follow up appointment)    Contact information:   [address] [place] [address] [phone]       Signed: Sherrie George 07/20/2012, 2:23 PM

## 2012-07-21 NOTE — Discharge Summary (Signed)
ATTENDING ADDENDUM:  I personally reviewed patient's record, examined the patient, and formulated the following assessment and plan:  Patient did well with medical management for SBO.  F/U PRN

## 2012-07-21 NOTE — Discharge Summary (Signed)
Sherrie George, PA Physician Assistant Signed  Progress Notes Service date: 07/20/2012 2:06 PM   Physician Discharge Summary   Patient ID:  Jonathan Burke  MRN: [id number]  DOB/AGE: 1961/01/20 52 y.o.  Rudi Heap, MD  Admit date: 07/17/2012  Discharge date: 07/20/2012  Admission Diagnoses: SBO vs atypical Crohn's disease. Hx of appendectomy and cholecystectomy.  Discharge Diagnoses: same  Principal Problem:  SBO (small bowel obstruction)  Active Problems:  GERD (gastroesophageal reflux disease)  Obesity (BMI 30-39.9)   PROCEDURES: None  Hospital Course: Obese male with prior cholecystectomy and appendectomy. Has had intermittent episodes of crampy abdominal pain and nausea vomiting. He had a colonoscopy last year by Dr. Matthias Hughs with [facility] gastroenterology. Normal per him. Family with room. Four days ago, he had worsening abdominal pain and nausea vomiting. He normally has a bowel movement twice a day. No sick contacts or travel history. No diarrhea. Worsening crampy pain. Had x-rays done earlier today concerning for bowel structure. Came emergency room. X-rays and CT scan concerning for bowel obstruction. Surgical consultation requested.  Pt was admitted by Dr. Michaell Cowing and placed on NG decompression, hydration and bowel rest. It sounds like he has had lesser bouts of this before.Marland Kitchen He has opened up and now is ready for discharge.  Condition on D/C: Imporved  Disposition: 01-Home or Self Care        Medication List       TAKE these medications         omeprazole 20 MG capsule     Commonly known as: PRILOSEC     Take 20 mg by mouth daily.     tadalafil 5 MG tablet     Commonly known as: CIALIS     Take 5 mg by mouth daily as needed for erectile dysfunction.          Follow-up Information     Schedule an appointment as soon as possible for a visit to follow up. (As needed; you dont need a surgeon till your stopped up and won't open. Call if you have a problem.)         Follow up  with Rudi Heap, MD. (Call for follow up appointment)     Contact information:     [address]  [place] [address]  [phone]        Signed:  Sherrie George  07/20/2012, 2:23 PM    Cosigned by: Romie Levee, MD [07/20/2012 7:44 PM]

## 2012-09-05 ENCOUNTER — Encounter: Payer: Self-pay | Admitting: Family Medicine

## 2012-09-05 ENCOUNTER — Ambulatory Visit (INDEPENDENT_AMBULATORY_CARE_PROVIDER_SITE_OTHER): Payer: BC Managed Care – PPO | Admitting: Family Medicine

## 2012-09-05 VITALS — BP 113/78 | HR 74 | Temp 97.5°F | Ht 68.0 in | Wt 221.0 lb

## 2012-09-05 DIAGNOSIS — N400 Enlarged prostate without lower urinary tract symptoms: Secondary | ICD-10-CM | POA: Insufficient documentation

## 2012-09-05 DIAGNOSIS — E669 Obesity, unspecified: Secondary | ICD-10-CM

## 2012-09-05 DIAGNOSIS — E785 Hyperlipidemia, unspecified: Secondary | ICD-10-CM | POA: Insufficient documentation

## 2012-09-05 DIAGNOSIS — K56609 Unspecified intestinal obstruction, unspecified as to partial versus complete obstruction: Secondary | ICD-10-CM

## 2012-09-05 DIAGNOSIS — K219 Gastro-esophageal reflux disease without esophagitis: Secondary | ICD-10-CM

## 2012-09-05 NOTE — Assessment & Plan Note (Signed)
Information in AVS on nutrition.

## 2012-09-05 NOTE — Assessment & Plan Note (Signed)
counselled on dietary changes and information given in the AVS.

## 2012-09-05 NOTE — Patient Instructions (Addendum)
Diet and Exercise discussed with patient. For nutrition information, I recommend books: Eat to Live by Dr Monico Hoar. Prevent and Reverse Heart Disease by Dr Suzzette Righter.  Exercise recommendations are:  If unable to walk, then the patient can exercise in a chair 3 times a day. By flapping arms like a bird gently and raising legs outwards to the front.  If ambulatory, the patient can go for walks for 30 minutes 3 times a week. Then increase the intensity and duration as tolerated. Goal is to try to attain exercise frequency to 5 times a week. Best to perform resistance exercises 2 days a week and cardio type exercises 3 days per week.

## 2012-09-05 NOTE — Assessment & Plan Note (Signed)
Stable No changes 

## 2012-09-05 NOTE — Assessment & Plan Note (Signed)
Resolved and  Stable. Discussed possibility on recurrence with patient and wife. The  Explanation they had  Received is that etiology was most likely appendectomy as a teenager with adhesions forming.

## 2012-09-05 NOTE — Progress Notes (Signed)
Patient ID: Jonathan Burke, male   DOB: 01/16/1961, 52 y.o.   MRN: [id number] SUBJECTIVE:  HPI: Since his physical in December 2013, patient has had multiple significant medical and surgical interventions. 1 hematuria. Extensively worked up by Dr. Annabell Burke. Only finding was mildly enlarged prostate. 2 patient underwent carpal tunnel release. 3 patient had small bowel obstruction in February 2004. He was hospitalized. Recovered with no significant surgical intervention. He is planning to go back to work today. He is feeling well. He came to get checked. No problems at present. In regards to his BPH symptoms. He was intolerant to Flomax. He will followup with Dr. Annabell Burke.   PMH/PSH: reviewed/updated in Epic  SH/FH: reviewed/updated in Epic  Allergies: reviewed/updated in Epic  Medications: reviewed/updated in Epic  Immunizations: reviewed/updated in Epic   ROS: As above in the HPI. All other systems are stable or negative.   OBJECTIVE: On examination he appeared in good health and spirits. No distress. Anicteric, Acyanotic Vital signs as documented. BP 113/78  Pulse 74  Temp(Src) 97.5 F (36.4 C) (Oral)  Ht 5\' 8"  (1.727 m)  Wt 221 lb (100.245 kg)  BMI 33.61 kg/m2 Obese Skin warm and dry and without overt rashes.  Head,Ears,Eyes,Throat: normal Neck without JVD.  Lungs clear.  Heart exam notable for regular rhythm, normal sounds and absence of murmurs, rubs or gallops. Abdomen unremarkable and without evidence of organomegaly, masses, or abdominal aortic enlargement. YQ:[id number] Extremities nonedematous. Neurologic:nonfocal  ASSESSMENT: Obesity (BMI 30-39.9) Information in AVS on nutrition.  GERD (gastroesophageal reflux disease) Stable. No changes.  Dyslipidemia (high LDL; low HDL) counselled on dietary changes and information given in the AVS.  SBO (small bowel obstruction) Resolved and  Stable. Discussed possibility on recurrence with patient and wife. The   Explanation they had  Received is that etiology was most likely appendectomy as a teenager with adhesions forming.  BPH (benign prostatic hyperplasia) Intolerant to flomax. Will leave the next step to Dr Jonathan Burke.Patient plans to see him soon.   PLAN:  Orders Placed This Encounter  Procedures  . NMR Lipoprofile with Lipids     Diet and Exercise discussed with patient. For nutrition information, I recommend books: Eat to Live by Dr Monico Hoar. Prevent and Reverse Heart Disease by Dr Suzzette Righter.  Exercise recommendations are:  If unable to walk, then the patient can exercise in a chair 3 times a day. By flapping arms like a bird gently and raising legs outwards to the front.  If ambulatory, the patient can go for walks for 30 minutes 3 times a week. Then increase the intensity and duration as tolerated. Goal is to try to attain exercise frequency to 5 times a week. Best to perform resistance exercises 2 days a week and cardio type exercises 3 days per week.                              Discussed need for Lifestyle therapeutic  Changes.  Jonathan Burke P. [name], M.D.

## 2012-09-05 NOTE — Assessment & Plan Note (Signed)
Intolerant to flomax. Will leave the next step to Dr Annabell Howells.Patient plans to see him soon.

## 2012-09-06 ENCOUNTER — Encounter: Payer: Self-pay | Admitting: Family Medicine

## 2012-09-06 LAB — NMR LIPOPROFILE WITH LIPIDS
Cholesterol, Total: 155 mg/dL (ref <200)
HDL Particle Number: 28.9 umol/L — ABNORMAL LOW (ref >=30.5)
HDL Size: 8.4 nm — ABNORMAL LOW (ref >=9.2)
HDL-C: 43 mg/dL (ref >=40)
LDL (calc): 98 mg/dL (ref <100)
LDL Particle Number: 1218 nmol/L — ABNORMAL HIGH (ref <1000)
LDL Size: 20.5 nm — ABNORMAL LOW (ref >20.5)
LP-IR Score: 60 — ABNORMAL HIGH (ref <=45)
Large HDL-P: <1.3 umol/L — ABNORMAL LOW (ref >=4.8)
Large VLDL-P: 2.5 nmol/L (ref <=2.7)
Small LDL Particle Number: 757 nmol/L — ABNORMAL HIGH (ref <=527)
Triglycerides: 68 mg/dL (ref <150)
VLDL Size: 42.4 nm (ref <=46.6)

## 2012-09-06 NOTE — Progress Notes (Signed)
Quick Note:  Lab result at goal. No change in Medications for now. No Change in plans and follow up. Lipids would be better with diet and exercise as we discussed. Follow up In 6 months. ______

## 2012-09-07 ENCOUNTER — Telehealth: Payer: Self-pay

## 2012-09-07 NOTE — Telephone Encounter (Signed)
Message copied by Pura Spice on Thu Sep 07, 2012  4:13 PM ------      Message from: Ileana Ladd      Created: Wed Sep 06, 2012 10:55 PM       Lab result at goal.      No change in Medications for now.      No Change in plans and follow up.      Lipids would be better with diet and exercise as we discussed.      Follow up  In 6 months. ------

## 2012-09-07 NOTE — Telephone Encounter (Signed)
Pt aware of lab results 

## 2013-01-17 ENCOUNTER — Telehealth: Payer: Self-pay | Admitting: Family Medicine

## 2013-01-17 NOTE — Telephone Encounter (Addendum)
Spoke with wife and she says he is having some abd pain, bloatness  , no constipation no diarrhea. Wife says she dont think its bowel obstruction but has a history of bowel obstruction back in Feb.and had follow up in April.   Per Dr [name] he said he needs to get checked today in the ED if he has pain and bloating that he may have partial bowel obstruction. Wife wants to know will you see him tomorrow cause she dont think he will go to the ER.   Advised wife per dr [name] if he has partial obstruction dr [name] would be sending to ED if he has obstruction he could develop sepsis .he takes the risk.   Wife aware and I told wife that he advises the ED today and call me tomorrow and Dr [name] aware I would leave him on schedule.   The above responses were a text message from Dr [name] since not in office today

## 2013-01-18 ENCOUNTER — Ambulatory Visit: Payer: BC Managed Care – PPO | Admitting: Family Medicine

## 2013-02-28 ENCOUNTER — Ambulatory Visit (INDEPENDENT_AMBULATORY_CARE_PROVIDER_SITE_OTHER): Payer: BC Managed Care – PPO | Admitting: Surgery

## 2013-02-28 ENCOUNTER — Encounter (INDEPENDENT_AMBULATORY_CARE_PROVIDER_SITE_OTHER): Payer: Self-pay | Admitting: Surgery

## 2013-02-28 VITALS — BP 118/70 | HR 68 | Temp 97.9°F | Resp 14 | Ht 68.0 in | Wt 219.8 lb

## 2013-02-28 DIAGNOSIS — K56600 Partial intestinal obstruction, unspecified as to cause: Secondary | ICD-10-CM

## 2013-02-28 DIAGNOSIS — K56609 Unspecified intestinal obstruction, unspecified as to partial versus complete obstruction: Secondary | ICD-10-CM

## 2013-02-28 NOTE — Progress Notes (Signed)
Subjective:     Patient ID: Jonathan Burke, male   DOB: 12/04/1960, 52 y.o.   MRN: [id number]  HPI This is a patient that is known to our group.  He was admitted in February of this year with a small bowel obstruction. He had a previous open appendectomy and laparoscopic cholecystectomy. His CAT scan at that time was fairly impressive he improved with conservative management. Since then, he still has intermittent cramping abdominal pain and bloating. It improves only when he stays a liquid diet. He has lost at least 10 pounds of last several months.  Review of Systems     Objective:   Physical Exam  Follow exam, his abdomen is distended But really not tender.  He does have slight rectus diastases. There are multiple well-healed incisions without obvious hernia     Assessment:     Chronic partial small bowel obstruction     Plan:     At this point a long discussion with the patient. He was to continue conservative treatment for now and plan on surgery later in the year which I feel is reasonable. I do suspect adhesions are causing a partial obstruction or an internal volvulus. I would recommend a diagnostic laparoscopy possible laparotomy. He is going to discuss this with his wife and hopefully schedule this for December and unless he acutely worsens

## 2013-03-01 ENCOUNTER — Telehealth (INDEPENDENT_AMBULATORY_CARE_PROVIDER_SITE_OTHER): Payer: Self-pay | Admitting: Surgery

## 2013-03-01 NOTE — Telephone Encounter (Signed)
Patient will call back to schedule surgery, face sheet placed in pending

## 2013-03-02 ENCOUNTER — Other Ambulatory Visit (INDEPENDENT_AMBULATORY_CARE_PROVIDER_SITE_OTHER): Payer: Self-pay | Admitting: Surgery

## 2013-03-05 ENCOUNTER — Other Ambulatory Visit (INDEPENDENT_AMBULATORY_CARE_PROVIDER_SITE_OTHER): Payer: Self-pay | Admitting: Surgery

## 2013-05-14 ENCOUNTER — Encounter (HOSPITAL_COMMUNITY): Payer: Self-pay | Admitting: Pharmacy Technician

## 2013-05-18 ENCOUNTER — Other Ambulatory Visit (HOSPITAL_COMMUNITY): Payer: Self-pay | Admitting: *Deleted

## 2013-05-18 NOTE — Pre-Procedure Instructions (Signed)
Jonathan Burke  05/18/2013   Your procedure is scheduled on:  Tuesday, May 29, 2013 at 1:00 PM.   Report to [facility] Entrance "A" Admitting Office at 11:00 AM.   Call this number if you have problems the morning of surgery: [phone]   Remember:   Do not eat food or drink liquids after midnight Monday, 05/28/13.   Take these medicines the morning of surgery with A SIP OF WATER: Prilosec  Stop taking Aspirin, Aleve, BC's, Goody's, Ibuprofen, Herbal medications, and Fish Oil   Do not wear jewelry.  Do not wear lotions, powders, or cologne. You may wear deodorant.  Men may shave face and neck.  Do not bring valuables to the hospital.  [facility] is not responsible                  for any belongings or valuables.               Contacts, dentures or bridgework may not be worn into surgery.  Leave suitcase in the car. After surgery it may be brought to your room.  For patients admitted to the hospital, discharge time is determined by your                treatment team.            Special Instructions: Shower using CHG 2 nights before surgery and the night before surgery.  If you shower the day of surgery use CHG.  Use special wash - you have one bottle of CHG for all showers.  You should use approximately 1/3 of the bottle for each shower.   Please read over the following fact sheets that you were given: Pain Booklet, Coughing and Deep Breathing, Blood Transfusion Information and Surgical Site Infection Prevention

## 2013-05-21 ENCOUNTER — Encounter (HOSPITAL_COMMUNITY): Payer: Self-pay

## 2013-05-21 ENCOUNTER — Encounter (HOSPITAL_COMMUNITY)
Admission: RE | Admit: 2013-05-21 | Discharge: 2013-05-21 | Disposition: A | Discharge status: Home / Self Care | Payer: BC Managed Care – PPO | Source: Ambulatory Visit | Attending: Surgery | Admitting: Surgery

## 2013-05-21 DIAGNOSIS — Z01812 Encounter for preprocedural laboratory examination: Secondary | ICD-10-CM | POA: Insufficient documentation

## 2013-05-21 HISTORY — DX: Hematuria, unspecified: R31.9

## 2013-05-21 HISTORY — DX: Benign prostatic hyperplasia without lower urinary tract symptoms: N40.0

## 2013-05-21 HISTORY — DX: Personal history of other diseases of the digestive system: Z87.19

## 2013-05-21 HISTORY — DX: Other specified postprocedural states: R11.2

## 2013-05-21 HISTORY — DX: Other specified postprocedural states: Z98.890

## 2013-05-21 LAB — CBC
HCT: 40.5 % (ref 39.0–52.0)
Hemoglobin: 14.1 g/dL (ref 13.0–17.0)
MCH: 32.3 pg (ref 26.0–34.0)
MCHC: 34.8 g/dL (ref 30.0–36.0)

## 2013-05-21 LAB — TYPE AND SCREEN
ABO/RH(D): B POS
Antibody Screen: NEGATIVE

## 2013-05-21 LAB — ABO/RH: ABO/RH(D): B POS

## 2013-05-21 NOTE — Progress Notes (Addendum)
Not here for 0800 appointment message left on pt home number and cell phone number to return call.   Pt here at 0830 and interview complete.

## 2013-05-21 NOTE — Progress Notes (Signed)
Pcp is Dr [name] Denies seeing a cardiologist. Denies having a recent CXR, EKG. Denies having a stress test, echo, or card cath.Marland Kitchen

## 2013-05-28 MED ORDER — CEFAZOLIN SODIUM-DEXTROSE 2-3 GM-% IV SOLR: 2.0000 g | INTRAVENOUS | Status: DC

## 2013-05-28 NOTE — H&P (Signed)
Jonathan Burke is an 52 y.o. male.   Chief Complaint: Recurrent small bowel obstruction HPI: This is a pleasant gentleman who has had multiple abdominal procedures. He has had multiple admissions for partial small bowel obstructions. He now presents for elective diagnostic laparoscopy and possible laparotomy. He has had a significant amount of weight loss secondary to his intermittent obstructions.  Past Medical History  Diagnosis Date  . Headache(784.0)     tension  . Dental crowns present     also caps  . 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 GERD (gastroesophageal reflux disease)     daily OTC  . Complication of anesthesia     states is hard to wake up  . Small bowel obstruction 07/17/2012  . PONV (postoperative nausea and vomiting)   . Blood in urine   . Enlarged prostate   . H/O hiatal hernia     pt unsure if hernia or not, but has a knot in his stomach    Past Surgical History  Procedure Laterality Date  . 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 Lumbar laminectomy/decompression microdiscectomy  10/29/1999    L5-S1  . Shoulder surgery      left  . Carpal tunnel release  09/09/2011    Procedure: CARPAL TUNNEL RELEASE;  Surgeon: Wyn Forster., MD;  Location: [facility];  Service: Orthopedics;  Laterality: Left;  . Cholecystectomy    . Appendectomy      Family History  Problem Relation Age of Onset  . Heart attack Father   . Arthritis Sister   . Hypertension Brother   . Hypertension Sister    Social History:  reports that he has never smoked. He has never used smokeless tobacco. He reports that he does not drink alcohol or use illicit drugs.  Allergies:  Allergies  Allergen Reactions  . Adhesive [Tape] Other (See Comments)    PULLS SKIN OFF. Paper tape only    No prescriptions prior to admission    No results found for this or any previous visit (from the past 48  hour(s)). No results found.  Review of Systems  All other systems reviewed and are negative.    There were no vitals taken for this visit. Physical Exam  Constitutional: He appears well-developed and well-nourished. No distress.  HENT:  Head: Normocephalic.  Right Ear: External ear normal.  Left Ear: External ear normal.  Mouth/Throat: No oropharyngeal exudate.  Eyes: Conjunctivae are normal. Pupils are equal, round, and reactive to light.  Neck: Normal range of motion. Neck supple. No tracheal deviation present.  Cardiovascular: Normal rate, regular rhythm, normal heart sounds and intact distal pulses.   No murmur heard. Respiratory: Effort normal. No respiratory distress.  GI: Soft. He exhibits distension. There is no tenderness. There is no rebound.  Musculoskeletal: Normal range of motion.  Lymphadenopathy:    He has no cervical adenopathy.  Neurological: He is alert.  Skin: Skin is warm and dry. He is not diaphoretic.  Psychiatric: His behavior is normal.     Assessment/Plan Chronic partial small bowel obstruction  After a long discussion, he wished to proceed with a diagnostic laparoscopy and possible laparotomy with lysis of adhesions. I explained the risks which includes but is not limited to bleeding, infection, recurrent obstructions, need to convert to an open procedure, need for bowel resection, et Karie Soda. He understands and wishes to proceed. Surgery is scheduled  Tjuana Vickrey A 05/28/2013, 1:06 PM

## 2013-05-29 ENCOUNTER — Inpatient Hospital Stay (HOSPITAL_COMMUNITY)
Admission: RE | Admit: 2013-05-29 | Discharge: 2013-06-05 | DRG: 337 | Disposition: A | Discharge status: Home / Self Care | Payer: BC Managed Care – PPO | Source: Ambulatory Visit | Attending: Surgery | Admitting: Surgery

## 2013-05-29 ENCOUNTER — Encounter (HOSPITAL_COMMUNITY)
Admission: RE | Disposition: A | Discharge status: Home / Self Care | Payer: Self-pay | Source: Ambulatory Visit | Attending: Surgery

## 2013-05-29 ENCOUNTER — Encounter (HOSPITAL_COMMUNITY): Payer: Self-pay | Admitting: Surgery

## 2013-05-29 ENCOUNTER — Inpatient Hospital Stay (HOSPITAL_COMMUNITY): Payer: BC Managed Care – PPO | Admitting: Anesthesiology

## 2013-05-29 ENCOUNTER — Encounter (HOSPITAL_COMMUNITY): Payer: BC Managed Care – PPO | Admitting: Anesthesiology

## 2013-05-29 DIAGNOSIS — Z8261 Family history of arthritis: Secondary | ICD-10-CM

## 2013-05-29 DIAGNOSIS — G709 Myoneural disorder, unspecified: Secondary | ICD-10-CM | POA: Diagnosis present

## 2013-05-29 DIAGNOSIS — K560 Paralytic ileus: Secondary | ICD-10-CM | POA: Diagnosis present

## 2013-05-29 DIAGNOSIS — K660 Peritoneal adhesions (postprocedural) (postinfection): Secondary | ICD-10-CM | POA: Diagnosis present

## 2013-05-29 DIAGNOSIS — R634 Abnormal weight loss: Secondary | ICD-10-CM | POA: Diagnosis present

## 2013-05-29 DIAGNOSIS — K219 Gastro-esophageal reflux disease without esophagitis: Secondary | ICD-10-CM | POA: Diagnosis present

## 2013-05-29 DIAGNOSIS — Z79899 Other long term (current) drug therapy: Secondary | ICD-10-CM

## 2013-05-29 DIAGNOSIS — K56609 Unspecified intestinal obstruction, unspecified as to partial versus complete obstruction: Principal | ICD-10-CM | POA: Diagnosis present

## 2013-05-29 DIAGNOSIS — Z8249 Family history of ischemic heart disease and other diseases of the circulatory system: Secondary | ICD-10-CM

## 2013-05-29 DIAGNOSIS — E669 Obesity, unspecified: Secondary | ICD-10-CM | POA: Diagnosis present

## 2013-05-29 DIAGNOSIS — Z9109 Other allergy status, other than to drugs and biological substances: Secondary | ICD-10-CM

## 2013-05-29 DIAGNOSIS — Z5331 Laparoscopic surgical procedure converted to open procedure: Secondary | ICD-10-CM

## 2013-05-29 HISTORY — PX: LAPAROSCOPIC LYSIS OF ADHESIONS: SHX5905

## 2013-05-29 SURGERY — LYSIS, ADHESIONS, LAPAROSCOPIC
Anesthesia: General | Site: Abdomen

## 2013-05-29 MED ORDER — NALOXONE HCL 0.4 MG/ML IJ SOLN: 0.4000 mg | INTRAMUSCULAR | Status: DC | PRN

## 2013-05-29 MED ORDER — HYDROMORPHONE HCL PF 1 MG/ML IJ SOLN
0.2500 mg | INTRAMUSCULAR | Status: DC | PRN
  Administered 2013-05-29 (×2): 0.5 mg via INTRAVENOUS

## 2013-05-29 MED ORDER — PROPOFOL 10 MG/ML IV BOLUS
INTRAVENOUS | Status: DC | PRN
  Administered 2013-05-29: 200 mg via INTRAVENOUS
  Administered 2013-05-29: 50 mg via INTRAVENOUS

## 2013-05-29 MED ORDER — FENTANYL CITRATE 0.05 MG/ML IJ SOLN
INTRAMUSCULAR | Status: DC | PRN
  Administered 2013-05-29: 100 ug via INTRAVENOUS
  Administered 2013-05-29: 25 ug via INTRAVENOUS
  Administered 2013-05-29 (×2): 50 ug via INTRAVENOUS
  Administered 2013-05-29: 75 ug via INTRAVENOUS

## 2013-05-29 MED ORDER — INFLUENZA VAC SPLIT QUAD 0.5 ML IM SUSP
0.5000 mL | INTRAMUSCULAR | Status: AC
Start: 2013-05-30 — End: 2013-05-30
  Administered 2013-05-30: 0.5 mL via INTRAMUSCULAR
  Filled 2013-05-29: qty 0.5

## 2013-05-29 MED ORDER — PROMETHAZINE HCL 25 MG/ML IJ SOLN
6.2500 mg | INTRAMUSCULAR | Status: DC | PRN
  Administered 2013-05-29: 6.25 mg via INTRAVENOUS

## 2013-05-29 MED ORDER — CEFAZOLIN SODIUM-DEXTROSE 2-3 GM-% IV SOLR
INTRAVENOUS | Status: AC
  Administered 2013-05-29: 2 g via INTRAVENOUS
  Filled 2013-05-29: qty 50

## 2013-05-29 MED ORDER — 0.9 % SODIUM CHLORIDE (POUR BTL) OPTIME
TOPICAL | Status: DC | PRN
  Administered 2013-05-29 (×3): 1000 mL

## 2013-05-29 MED ORDER — PHENYLEPHRINE HCL 10 MG/ML IJ SOLN
INTRAMUSCULAR | Status: DC | PRN
  Administered 2013-05-29: 240 ug via INTRAVENOUS
  Administered 2013-05-29: 80 ug via INTRAVENOUS

## 2013-05-29 MED ORDER — ENOXAPARIN SODIUM 40 MG/0.4ML ~~LOC~~ SOLN
40.0000 mg | SUBCUTANEOUS | Status: DC
  Administered 2013-05-30 – 2013-06-04 (×6): 40 mg via SUBCUTANEOUS
  Filled 2013-05-29 (×7): qty 0.4

## 2013-05-29 MED ORDER — MIDAZOLAM HCL 2 MG/2ML IJ SOLN: 1.0000 mg | INTRAMUSCULAR | Status: DC | PRN

## 2013-05-29 MED ORDER — OXYCODONE HCL 5 MG PO TABS: 5.0000 mg | ORAL_TABLET | Freq: Once | ORAL | Status: DC | PRN

## 2013-05-29 MED ORDER — DIPHENHYDRAMINE HCL 50 MG/ML IJ SOLN: 12.5000 mg | Freq: Four times a day (QID) | INTRAMUSCULAR | Status: DC | PRN

## 2013-05-29 MED ORDER — ONDANSETRON HCL 4 MG PO TABS: 4.0000 mg | ORAL_TABLET | Freq: Four times a day (QID) | ORAL | Status: DC | PRN

## 2013-05-29 MED ORDER — ONDANSETRON HCL 4 MG/2ML IJ SOLN
4.0000 mg | Freq: Four times a day (QID) | INTRAMUSCULAR | Status: DC | PRN
  Administered 2013-05-29 – 2013-05-30 (×2): 4 mg via INTRAVENOUS

## 2013-05-29 MED ORDER — OXYCODONE HCL 5 MG/5ML PO SOLN
5.0000 mg | Freq: Once | ORAL | Status: DC | PRN
Start: 2013-05-29 — End: 2013-05-29

## 2013-05-29 MED ORDER — MIDAZOLAM HCL 5 MG/5ML IJ SOLN
INTRAMUSCULAR | Status: DC | PRN
  Administered 2013-05-29: 2 mg via INTRAVENOUS

## 2013-05-29 MED ORDER — HYDROMORPHONE HCL PF 1 MG/ML IJ SOLN
INTRAMUSCULAR | Status: AC
  Filled 2013-05-29: qty 1

## 2013-05-29 MED ORDER — HYDROMORPHONE 0.3 MG/ML IV SOLN
INTRAVENOUS | Status: AC
  Filled 2013-05-29: qty 25

## 2013-05-29 MED ORDER — HYDROMORPHONE 0.3 MG/ML IV SOLN
INTRAVENOUS | Status: DC
  Administered 2013-05-29: 15:00:00 via INTRAVENOUS
  Administered 2013-05-29: 2.1 mg via INTRAVENOUS
  Administered 2013-05-29: 0.9 mg via INTRAVENOUS
  Administered 2013-05-30 (×2): 0.3 mg via INTRAVENOUS
  Administered 2013-05-30: 0.6 mg via INTRAVENOUS
  Administered 2013-05-30: 2.8 mg via INTRAVENOUS
  Filled 2013-05-29: qty 25

## 2013-05-29 MED ORDER — BUPIVACAINE-EPINEPHRINE (PF) 0.25% -1:200000 IJ SOLN
INTRAMUSCULAR | Status: AC
  Filled 2013-05-29: qty 30

## 2013-05-29 MED ORDER — ONDANSETRON HCL 4 MG/2ML IJ SOLN
INTRAMUSCULAR | Status: DC | PRN
  Administered 2013-05-29: 4 mg via INTRAVENOUS

## 2013-05-29 MED ORDER — LACTATED RINGERS IV SOLN
INTRAVENOUS | Status: DC | PRN
  Administered 2013-05-29 (×2): via INTRAVENOUS

## 2013-05-29 MED ORDER — NEOSTIGMINE METHYLSULFATE 1 MG/ML IJ SOLN
INTRAMUSCULAR | Status: DC | PRN
  Administered 2013-05-29: 4 mg via INTRAVENOUS

## 2013-05-29 MED ORDER — LACTATED RINGERS IV SOLN
INTRAVENOUS | Status: DC
  Administered 2013-05-29: 12:00:00 via INTRAVENOUS

## 2013-05-29 MED ORDER — DIPHENHYDRAMINE HCL 12.5 MG/5ML PO ELIX: 12.5000 mg | ORAL_SOLUTION | Freq: Four times a day (QID) | ORAL | Status: DC | PRN

## 2013-05-29 MED ORDER — MORPHINE SULFATE 4 MG/ML IJ SOLN
4.0000 mg | INTRAMUSCULAR | Status: DC | PRN
  Administered 2013-05-30 – 2013-06-05 (×23): 4 mg via INTRAVENOUS
  Filled 2013-05-29 (×23): qty 1

## 2013-05-29 MED ORDER — BUPIVACAINE-EPINEPHRINE 0.25% -1:200000 IJ SOLN
INTRAMUSCULAR | Status: DC | PRN
  Administered 2013-05-29: 20 mL

## 2013-05-29 MED ORDER — GLYCOPYRROLATE 0.2 MG/ML IJ SOLN
INTRAMUSCULAR | Status: DC | PRN
  Administered 2013-05-29: .4 mg via INTRAVENOUS

## 2013-05-29 MED ORDER — SUCCINYLCHOLINE CHLORIDE 20 MG/ML IJ SOLN
INTRAMUSCULAR | Status: DC | PRN
  Administered 2013-05-29: 100 mg via INTRAVENOUS

## 2013-05-29 MED ORDER — POTASSIUM CHLORIDE IN NACL 20-0.9 MEQ/L-% IV SOLN
INTRAVENOUS | Status: DC
  Administered 2013-05-29 – 2013-06-05 (×18): via INTRAVENOUS
  Filled 2013-05-29 (×25): qty 1000

## 2013-05-29 MED ORDER — ROCURONIUM BROMIDE 100 MG/10ML IV SOLN
INTRAVENOUS | Status: DC | PRN
  Administered 2013-05-29 (×5): 10 mg via INTRAVENOUS

## 2013-05-29 MED ORDER — FENTANYL CITRATE 0.05 MG/ML IJ SOLN: 50.0000 ug | Freq: Once | INTRAMUSCULAR | Status: DC

## 2013-05-29 MED ORDER — SODIUM CHLORIDE 0.9 % IJ SOLN: 9.0000 mL | INTRAMUSCULAR | Status: DC | PRN

## 2013-05-29 MED ORDER — HYDROMORPHONE BOLUS VIA INFUSION
1.0000 mg | Freq: Once | INTRAVENOUS | Status: AC
  Administered 2013-05-29: 1 mg via INTRAVENOUS
  Filled 2013-05-29: qty 1

## 2013-05-29 MED ORDER — PROMETHAZINE HCL 25 MG/ML IJ SOLN
INTRAMUSCULAR | Status: AC
  Filled 2013-05-29: qty 1

## 2013-05-29 MED ORDER — ONDANSETRON HCL 4 MG/2ML IJ SOLN
4.0000 mg | Freq: Four times a day (QID) | INTRAMUSCULAR | Status: DC | PRN
  Administered 2013-06-04: 4 mg via INTRAVENOUS
  Filled 2013-05-29 (×3): qty 2

## 2013-05-29 MED ORDER — METOCLOPRAMIDE HCL 5 MG/ML IJ SOLN: 10.0000 mg | Freq: Three times a day (TID) | INTRAMUSCULAR | Status: AC | PRN

## 2013-05-29 MED ORDER — CEFAZOLIN SODIUM 1-5 GM-% IV SOLN
1.0000 g | Freq: Three times a day (TID) | INTRAVENOUS | Status: AC
  Administered 2013-05-29: 1 g via INTRAVENOUS
  Filled 2013-05-29: qty 50

## 2013-05-29 SURGICAL SUPPLY — 50 items
APL SKNCLS STERI-STRIP NONHPOA (GAUZE/BANDAGES/DRESSINGS)
APPLIER CLIP 5 13 M/L LIGAMAX5 (MISCELLANEOUS)
APR CLP MED LRG 5 ANG JAW (MISCELLANEOUS)
BANDAGE ADHESIVE 1X3 (GAUZE/BANDAGES/DRESSINGS) ×2 IMPLANT
BENZOIN TINCTURE PRP APPL 2/3 (GAUZE/BANDAGES/DRESSINGS) ×1 IMPLANT
BLADE SURG 10 STRL SS (BLADE) ×1 IMPLANT
CANISTER SUCTION 2500CC (MISCELLANEOUS) IMPLANT
CHLORAPREP W/TINT 26ML (MISCELLANEOUS) ×2 IMPLANT
CLIP APPLIE 5 13 M/L LIGAMAX5 (MISCELLANEOUS) IMPLANT
COVER SURGICAL LIGHT HANDLE (MISCELLANEOUS) ×2 IMPLANT
DECANTER SPIKE VIAL GLASS SM (MISCELLANEOUS) ×1 IMPLANT
DRAPE WARM FLUID 44X44 (DRAPE) ×1 IMPLANT
ELECT CAUTERY BLADE 6.4 (BLADE) ×1 IMPLANT
ELECT REM PT RETURN 9FT ADLT (ELECTROSURGICAL) ×2
ELECTRODE REM PT RTRN 9FT ADLT (ELECTROSURGICAL) ×1 IMPLANT
GLOVE BIO SURGEON STRL SZ 6.5 (GLOVE) ×1 IMPLANT
GLOVE BIO SURGEON STRL SZ7.5 (GLOVE) ×1 IMPLANT
GLOVE BIOGEL PI IND STRL 6 (GLOVE) IMPLANT
GLOVE BIOGEL PI IND STRL 7.0 (GLOVE) IMPLANT
GLOVE BIOGEL PI IND STRL 7.5 (GLOVE) IMPLANT
GLOVE BIOGEL PI INDICATOR 6 (GLOVE) ×1
GLOVE BIOGEL PI INDICATOR 7.0 (GLOVE) ×1
GLOVE BIOGEL PI INDICATOR 7.5 (GLOVE) ×1
GLOVE SURG SIGNA 7.5 PF LTX (GLOVE) ×2 IMPLANT
GOWN STRL NON-REIN LRG LVL3 (GOWN DISPOSABLE) ×4 IMPLANT
GOWN STRL REIN XL XLG (GOWN DISPOSABLE) ×2 IMPLANT
KIT BASIN OR (CUSTOM PROCEDURE TRAY) ×2 IMPLANT
KIT ROOM TURNOVER OR (KITS) ×2 IMPLANT
NS IRRIG 1000ML POUR BTL (IV SOLUTION) ×4 IMPLANT
PAD ARMBOARD 7.5X6 YLW CONV (MISCELLANEOUS) ×2 IMPLANT
PENCIL BUTTON HOLSTER BLD 10FT (ELECTRODE) ×1 IMPLANT
SCISSORS LAP 5X35 DISP (ENDOMECHANICALS) ×2 IMPLANT
SET IRRIG TUBING LAPAROSCOPIC (IRRIGATION / IRRIGATOR) IMPLANT
SLEEVE ENDOPATH XCEL 5M (ENDOMECHANICALS) ×3 IMPLANT
SPONGE GAUZE 4X4 12PLY STER LF (GAUZE/BANDAGES/DRESSINGS) ×1 IMPLANT
SPONGE LAP 18X18 X RAY DECT (DISPOSABLE) ×1 IMPLANT
SUCTION POOLE TIP (SUCTIONS) ×1 IMPLANT
SUT MON AB 4-0 PC3 18 (SUTURE) ×2 IMPLANT
SUT SILK 3 0 SH CR/8 (SUTURE) ×1 IMPLANT
SYR BULB 3OZ (MISCELLANEOUS) ×1 IMPLANT
TAPE PAPER 3X10 WHT MICROPORE (GAUZE/BANDAGES/DRESSINGS) ×1 IMPLANT
TOWEL OR 17X24 6PK STRL BLUE (TOWEL DISPOSABLE) ×2 IMPLANT
TOWEL OR 17X26 10 PK STRL BLUE (TOWEL DISPOSABLE) ×2 IMPLANT
TOWEL OR NON WOVEN STRL DISP B (DISPOSABLE) ×1 IMPLANT
TRAY FOLEY CATH 16FR SILVER (SET/KITS/TRAYS/PACK) ×1 IMPLANT
TRAY LAPAROSCOPIC (CUSTOM PROCEDURE TRAY) ×2 IMPLANT
TROCAR XCEL BLUNT TIP 100MML (ENDOMECHANICALS) IMPLANT
TROCAR XCEL NON-BLD 5MMX100MML (ENDOMECHANICALS) ×2 IMPLANT
TUBE CONNECTING 12X1/4 (SUCTIONS) ×1 IMPLANT
YANKAUER SUCT BULB TIP NO VENT (SUCTIONS) ×1 IMPLANT

## 2013-05-29 NOTE — Anesthesia Preprocedure Evaluation (Signed)
Anesthesia Evaluation  Patient identified by MRN, date of birth, ID band Patient awake    Reviewed: Allergy & Precautions, H&P , NPO status , Patient's Chart, lab work & pertinent test results, reviewed documented beta blocker date and time   History of Anesthesia Complications (+) PONV, PROLONGED EMERGENCE and history of anesthetic complications  Airway Mallampati: II TM Distance: >3 FB Neck ROM: full    Dental   Pulmonary neg pulmonary ROS,  breath sounds clear to auscultation        Cardiovascular negative cardio ROS  Rhythm:Regular Rate:Normal     Neuro/Psych  Headaches,  Neuromuscular disease negative psych ROS   GI/Hepatic negative GI ROS, Neg liver ROS, GERD-  Medicated and Controlled,  Endo/Other  negative endocrine ROS  Renal/GU negative Renal ROS  negative genitourinary   Musculoskeletal   Abdominal (+) + obese,   Peds  Hematology negative hematology ROS (+)   Anesthesia Other Findings See surgeon's H&P   Reproductive/Obstetrics negative OB ROS                           Anesthesia Physical Anesthesia Plan  ASA: II  Anesthesia Plan: General   Post-op Pain Management:    Induction: Intravenous  Airway Management Planned: Oral ETT  Additional Equipment:   Intra-op Plan:   Post-operative Plan: Extubation in OR  Informed Consent: I have reviewed the patients History and Physical, chart, labs and discussed the procedure including the risks, benefits and alternatives for the proposed anesthesia with the patient or authorized representative who has indicated his/her understanding and acceptance.     Plan Discussed with: CRNA and Surgeon  Anesthesia Plan Comments:         Anesthesia Quick Evaluation

## 2013-05-29 NOTE — Op Note (Signed)
DIAGNOSTIC LAPAROSCOPY CONVERTED TO EXPLORATORY LAPAROTOMY, LYSIS OF ADHESIONS   Procedure Note  Jonathan Burke 05/29/2013   Pre-op Diagnosis: chronic partial small bowel obstruction      Post-op Diagnosis: same  Procedure(s): DIAGNOSTIC LAPAROSCOPY CONVERTED TO EXPLORATORY LAPAROTOMY, LYSIS OF ADHESIONS   Surgeon(s): Shelly Rubenstein, MD  Anesthesia: General  Staff:  Circulator: Maureen Ralphs, RN Relief Circulator: Gerre Pebbles Sipsis, RN Scrub Person: Janeece Agee Pingue, CST; Mindy Midge Aver, Company secretary: Alpha Gula, RN  Estimated Blood Loss: Minimal                         Jonathan Burke   Date: 05/29/2013  Time: 2:00 PM

## 2013-05-29 NOTE — Interval H&P Note (Signed)
History and Physical Interval Note: no change in H and P  05/29/2013 11:56 AM  Jonathan Burke  has presented today for surgery, with the diagnosis of partial small bowel obstruction   The various methods of treatment have been discussed with the patient and family. After consideration of risks, benefits and other options for treatment, the patient has consented to  Procedure(s): LAPAROSCOPIC LYSIS OF ADHESIONS possible open possible bowel resection  (N/A) as a surgical intervention .  The patient's history has been reviewed, patient examined, no change in status, stable for surgery.  I have reviewed the patient's chart and labs.  Questions were answered to the patient's satisfaction.     Aylen Stradford A

## 2013-05-29 NOTE — Anesthesia Postprocedure Evaluation (Signed)
  Anesthesia Post-op Note  Patient: Jonathan Burke  Procedure(s) Performed: Procedure(s): DIAGNOSTIC LAPAROSCOPY CONVERTED TO EXPLORATORY LAPAROTOMY, LYSIS OF ADHESIONS  (N/A)  Patient Location: PACU  Anesthesia Type:General  Level of Consciousness: awake  Airway and Oxygen Therapy: Patient Spontanous Breathing  Post-op Pain: mild  Post-op Assessment: Post-op Vital signs reviewed, Patient's Cardiovascular Status Stable, Respiratory Function Stable, Patent Airway, No signs of Nausea or vomiting and Pain level controlled  Post-op Vital Signs: Reviewed and stable  Complications: No apparent anesthesia complications

## 2013-05-29 NOTE — Anesthesia Procedure Notes (Signed)
Procedure Name: Intubation Date/Time: 05/29/2013 12:49 PM Performed by: Coralee Rud Pre-anesthesia Checklist: Patient identified, Emergency Drugs available, Suction available and Patient being monitored Patient Re-evaluated:Patient Re-evaluated prior to inductionOxygen Delivery Method: Circle system utilized Preoxygenation: Pre-oxygenation with 100% oxygen Intubation Type: IV induction Ventilation: Mask ventilation without difficulty Laryngoscope Size: Miller and 3 Grade View: Grade I Tube type: Oral Tube size: 8.0 mm Number of attempts: 1 Airway Equipment and Method: Stylet Placement Confirmation: ETT inserted through vocal cords under direct vision,  positive ETCO2 and breath sounds checked- equal and bilateral Secured at: 23 cm Tube secured with: Tape Dental Injury: Teeth and Oropharynx as per pre-operative assessment

## 2013-05-29 NOTE — Transfer of Care (Signed)
Immediate Anesthesia Transfer of Care Note  Patient: Jonathan Burke  Procedure(s) Performed: Procedure(s): DIAGNOSTIC LAPAROSCOPY CONVERTED TO EXPLORATORY LAPAROTOMY, LYSIS OF ADHESIONS  (N/A)  Patient Location: PACU  Anesthesia Type:General  Level of Consciousness: awake, sedated and patient cooperative  Airway & Oxygen Therapy: Patient Spontanous Breathing and Patient connected to face mask oxygen  Post-op Assessment: Report given to PACU RN, Post -op Vital signs reviewed and stable and Patient moving all extremities  Post vital signs: Reviewed and stable  Complications: No apparent anesthesia complications

## 2013-05-29 NOTE — Progress Notes (Signed)
Patient complaining of uncontrolled pain despite use of full dose Dilaudid PCA.  Patient has maxed out his dose.  MD on call paged. Awaiting orders. Will continue to monitor.

## 2013-05-30 ENCOUNTER — Encounter (HOSPITAL_COMMUNITY): Payer: Self-pay | Admitting: Surgery

## 2013-05-30 MED ORDER — SODIUM CHLORIDE 0.9 % IV SOLN
12.5000 mg | INTRAVENOUS | Status: DC | PRN
  Filled 2013-05-30: qty 0.5

## 2013-05-30 MED ORDER — PROMETHAZINE HCL 25 MG/ML IJ SOLN
12.5000 mg | INTRAMUSCULAR | Status: DC | PRN
  Administered 2013-05-30 – 2013-06-04 (×13): 12.5 mg via INTRAVENOUS
  Filled 2013-05-30 (×14): qty 1

## 2013-05-30 MED ORDER — KETOROLAC TROMETHAMINE 30 MG/ML IJ SOLN
30.0000 mg | Freq: Three times a day (TID) | INTRAMUSCULAR | Status: AC
  Administered 2013-05-30 (×3): 30 mg via INTRAVENOUS
  Filled 2013-05-30 (×3): qty 1

## 2013-05-30 NOTE — Progress Notes (Signed)
Nutrition Brief Note  Patient identified on the Malnutrition Screening Tool (MST) Report  Wt Readings from Last 15 Encounters:  05/29/13 228 lb 4.8 oz (103.556 kg)  05/29/13 228 lb 4.8 oz (103.556 kg)  05/21/13 228 lb 4.8 oz (103.556 kg)  02/28/13 219 lb 12.8 oz (99.701 kg)  09/05/12 221 lb (100.245 kg)  07/18/12 208 lb (94.348 kg)  09/06/11 218 lb (98.884 kg)  09/06/11 218 lb (98.884 kg)    Body mass index is 34.72 kg/(m^2). Patient meets criteria for obesity, class 1 based on current BMI.   Current diet order is NPO. Labs and medications reviewed.   Pt denies wt loss.  States his usual wt is between 210-215 lbs.  Admission wt is 228 lbs.  Pt states he will occasionally "mess up" and eat foods he knows are not well-tolerated; otherwise denies nutrition-related concerns. He feels he eats well when feeling well and is hopeful surgery will correct the abdominal distress he has had over the past 10 months. No nutrition interventions warranted at this time. If nutrition issues arise, please consult RD.   Loyce Dys, MS RD LDN Clinical Inpatient Dietitian Pager: [phone] Weekend/After hours pager: [phone]

## 2013-05-30 NOTE — Progress Notes (Signed)
1 Day Post-Op  Subjective: Complains of postop soreness Denies nausea  Objective: Vital signs in last 24 hours: Temp:  [97.7 F (36.5 C)-99.3 F (37.4 C)] 99.3 F (37.4 C) (12/31 0520) Pulse Rate:  [59-102] 102 (12/31 0520) Resp:  [12-23] 16 (12/31 0520) BP: (106-151)/(64-97) 124/71 mmHg (12/31 0520) SpO2:  [95 %-100 %] 96 % (12/31 0520) Weight:  [228 lb 4.8 oz (103.556 kg)] 228 lb 4.8 oz (103.556 kg) (12/30 1600) Last BM Date: 05/29/13  Intake/Output from previous day: 12/30 0701 - 12/31 0700 In: 2792.3 [I.V.:2792.3] Out: 1250 [Urine:1200; Blood:50] Intake/Output this shift:    Lungs clear Abdomen soft, dressing dry  Lab Results:  No results found for this basename: WBC, HGB, HCT, PLT,  in the last 72 hours BMET No results found for this basename: NA, K, CL, CO2, GLUCOSE, BUN, CREATININE, CALCIUM,  in the last 72 hours PT/INR No results found for this basename: LABPROT, INR,  in the last 72 hours ABG No results found for this basename: PHART, PCO2, PO2, HCO3,  in the last 72 hours  Studies/Results: No results found.  Anti-infectives: Anti-infectives   Start     Dose/Rate Route Frequency Ordered Stop   05/29/13 1600  ceFAZolin (ANCEF) IVPB 1 g/50 mL premix     1 g 100 mL/hr over 30 Minutes Intravenous 3 times per day 05/29/13 1559 05/29/13 1706   05/29/13 1203  ceFAZolin (ANCEF) 2-3 GM-% IVPB SOLR    Comments:  Jonathan, Burke   : cabinet override      05/29/13 1203 05/29/13 1255   05/29/13 0600  ceFAZolin (ANCEF) IVPB 2 g/50 mL premix  Status:  Discontinued     2 g 100 mL/hr over 30 Minutes Intravenous On call to O.R. 05/28/13 1410 05/29/13 1559      Assessment/Plan: s/p Procedure(s): DIAGNOSTIC LAPAROSCOPY CONVERTED TO EXPLORATORY LAPAROTOMY, LYSIS OF ADHESIONS  (N/A)  D/c foley Start clear liquids ambulate  LOS: 1 day    Jonathan Burke A 05/30/2013

## 2013-05-30 NOTE — Op Note (Signed)
NAMEPROSPER, PAFF NO.:  [id number]  MEDICAL RECORD NO.:  [id number]  LOCATION:  6N07C                        FACILITY:  [facility]  PHYSICIAN:  Abigail Miyamoto, M.D. DATE OF BIRTH:  Jan 31, 1961  DATE OF PROCEDURE:  05/29/2013 DATE OF DISCHARGE:                              OPERATIVE REPORT   PREOPERATIVE DIAGNOSIS:  Chronic partial small-bowel obstruction.  POSTOPERATIVE DIAGNOSIS:  Chronic partial small-bowel obstruction.  PROCEDURE:  Diagnostic laparoscopy converted to open laparotomy with lysis of adhesions.  SURGEON:  Abigail Miyamoto, M.D.  ANESTHESIA:  General.  ESTIMATED BLOOD LOSS:  Minimal.  INDICATION:  This is a 52 year old gentleman who presented with a partial small bowel obstruction in February 2014.  On CAT scan, he had 3 areas of angulated bowel.  He has had continuous intermittent distention and nausea and vomiting throughout the year.  Decision has been made to proceed with a diagnostic laparoscopy and possible laparotomy.  FINDINGS:  The patient was found to have minimal amount of adhesions. These involved the distal ileum as well as omentum to the abdominal wall.  There was no definite point of obstruction and no other abnormality identified.  PROCEDURE IN DETAIL:  The patient was brought to the operating room, identified as Annitta Needs.  He was placed supine on the operating table and general anesthesia was induced.  His abdomen was then prepped and draped in usual sterile fashion.  I made a small incision in the patient's left upper quadrant and then used the 5-mm trocar and Optiview camera to slowly traverse all layers of the abdominal wall and gain entrance into the peritoneal cavity under direct vision.  Insufflation was then begun.  I evaluated the entrance site and saw no evidence of bowel injury.  Upon entering the abdomen, the patient had adhesions of omentum to the umbilicus.  I was able to easily evaluate the  entire abdominal wall and saw no evidence of incisional or ventral hernia.  I placed two 5 mm ports in the patient's left mid left lower quadrant and lysed these adhesions with the scissors.  I then ran the small bowel from the ligament of Treitz to the terminal ileum.  The colon was slightly adhered to the abdominal wall of the cecum, which I took down with scissors.  There was also moderate adhesions tethering the terminal ileum to the right lower quadrant.  The rest of the small bowel appeared free of adhesions to the ligament of Treitz.  At this point, I could not entirely free up the small bowel in the right lower quadrant, so I decided to convert to an open procedure.  I removed the ports and carried a midline incision with a scalpel.  I took this down to the subcutaneous tissue and fascia with electrocautery and gained entrance to the abdominal cavity.  I again eviscerated the small bowel and ran from the ligament of Treitz to the terminal ileum and found no pathology.  Two separate areas in the mesentery were opened up during the laparoscopic running of the small bowel, so I closed these with interrupted silk sutures.  The bowel appeared without evidence of injury.  I then lysed the final adhesions  at the distal ileum and saw no evidence of stricturing of the bowel.  There was no evidence of Crohn's disease or any inflammatory bowel process.  I palpated the entire colon and did not feel any masses.  I felt no masses in the stomach or duodenum as well.  At this point, there was nothing further to do.  I thoroughly irrigated the abdomen with several L of normal saline.  I closed the patient's midline fascia with a running #1 looped PDS suture after assured hemostasis.  I then irrigated the skin incisions and anesthetized with Marcaine and then closed the incisions with staples. Gauze and tape were then applied.  The patient tolerated the procedure well.  All counts were correct at  the end of the procedure.  The patient was then extubated in the operating room and taken in a stable condition to recovery room.     Abigail Miyamoto, M.D.     DB/MEDQ  D:  05/29/2013  T:  05/30/2013  Job:  [id number]

## 2013-05-31 ENCOUNTER — Inpatient Hospital Stay (HOSPITAL_COMMUNITY): Payer: BC Managed Care – PPO

## 2013-05-31 NOTE — Progress Notes (Signed)
Patient's abdomen very distended and taut.  Patient complains of continuous burping/nausea and inability to pass flatus or stool.  Bowel sounds barely audible despite patient eating clear liquid diet and ambulating throughout the day.  Dr. Corliss Skainssuei notified and gave orders for ABD x-rays in the morning and NG tube to LIWS.  NG tube inserted and immediately put out 400mL clear, pink tinged fluid.  Patient tolerated procedure well.

## 2013-05-31 NOTE — Progress Notes (Signed)
General Surgery Note  LOS: 2 days  POD -  2 Days Post-Op  Assessment/Plan: 1.  DIAGNOSTIC LAPAROSCOPY CONVERTED TO EXPLORATORY LAPAROTOMY, LYSIS OF ADHESIONS - Jonathan Burke - 05/30/2103  NGT placed - 600 cc out.  KUB shows post op ileus  To keep NPO, check BMP in AM, encourage ambulation. 2. DVT prophylaxis - Lovenox   Active Problems:   SBO (small bowel obstruction)  Subjective:  Wife in room.  Does not feel good and a little disappointed in his course so far.  I tried to reassure him that this should improve, though could take several days.  Objective:   Filed Vitals:   05/31/13 0640  BP: 143/80  Pulse: 97  Temp:   Resp:      Intake/Output from previous day:  12/31 0701 - 01/01 0700 In: 3240 [P.O.:240; I.V.:3000] Out: 1202 [Urine:602; Emesis/NG output:600]  Intake/Output this shift:      Physical Exam:   General: WN WM who is alert and oriented. Has NGT in place.   HEENT: Normal. Pupils equal. .   Lungs: Clear.  Modest inspiratory effort.  IS - 800 cc.   Abdomen: Distended and quiet.     Wound: Dressings intact.   Lab Results:   No results found for this basename: WBC, HGB, HCT, PLT,  in the last 72 hours  BMET  No results found for this basename: NA, K, CL, CO2, GLUCOSE, BUN, CREATININE, CALCIUM,  in the last 72 hours  PT/INR  No results found for this basename: LABPROT, INR,  in the last 72 hours  ABG  No results found for this basename: PHART, PCO2, PO2, HCO3,  in the last 72 hours   Studies/Results:  Dg Abd 2 Views  05/31/2013   CLINICAL DATA:  Abdominal pain, abdominal distention  EXAM: ABDOMEN - 2 VIEW  COMPARISON:  07/19/2012  FINDINGS: There is gaseous distention of small bowel and colon with multiple small bowel air-fluid levels. There is a nasogastric tube with the tip projecting with systemic. There is no evidence of pneumoperitoneum, portal venous gas, or pneumatosis. There are no pathologic calcifications along the expected course of the ureters. There  are midline surgical staples in the lower abdomen. There is evidence of prior cholecystectomy.  The osseous structures are unremarkable.  IMPRESSION: Gaseous distention of small bowel and colon with multiple small bowel air-fluid levels. The findings are likely secondary to a postoperative ileus. Continued radiographic follow-up recommended.   Electronically Signed   By: Elige KoHetal  Patel   On: 05/31/2013 09:51     Anti-infectives:   Anti-infectives   Start     Dose/Rate Route Frequency Ordered Stop   05/29/13 1600  ceFAZolin (ANCEF) IVPB 1 g/50 mL premix     1 g 100 mL/hr over 30 Minutes Intravenous 3 times per day 05/29/13 1559 05/29/13 1706   05/29/13 1203  ceFAZolin (ANCEF) 2-3 GM-% IVPB SOLR    Comments:  Jonathan Burke, Jonathan Burke   : cabinet override      05/29/13 1203 05/29/13 1255   05/29/13 0600  ceFAZolin (ANCEF) IVPB 2 g/50 mL premix  Status:  Discontinued     2 g 100 mL/hr over 30 Minutes Intravenous On call to O.R. 05/28/13 1410 05/29/13 1559      Jonathan Kinavid Yazmyn Valbuena, MD, FACS Pager: [phone] [facility] Office: [phone] 05/31/2013

## 2013-06-01 LAB — BASIC METABOLIC PANEL
BUN: 12 mg/dL (ref 6–23)
CO2: 25 meq/L (ref 19–32)
CREATININE: 0.74 mg/dL (ref 0.50–1.35)
Calcium: 8.4 mg/dL (ref 8.4–10.5)
Chloride: 104 mEq/L (ref 96–112)
GFR calc Af Amer: >90 mL/min (ref >90)
GFR calc non Af Amer: >90 mL/min (ref >90)
GLUCOSE: 117 mg/dL — AB (ref 70–99)
Potassium: 4.5 mEq/L (ref 3.7–5.3)
Sodium: 139 mEq/L (ref 137–147)

## 2013-06-01 MED ORDER — BISACODYL 10 MG RE SUPP: 10.0000 mg | Freq: Every day | RECTAL | Status: DC | PRN

## 2013-06-01 NOTE — Progress Notes (Signed)
Pt. Still complaining of 7/10 pain, given PRN morphine.  Stomach still distended, bowel sounds assessed in all quadrants.  Pt. Says stomach "feels tight."  Pt. Requesting suppository for later tonight.  Pt. Not passing any gas and has not had BM as of today.  MD on call notified and ordered a suppository. Vanice Sarahhompson, Lio Wehrly L

## 2013-06-01 NOTE — Progress Notes (Signed)
MD orders to discontinue NG tube, discontinued and pt. Tolerated well.  Abdomen still distended but soft.  Faint bowel sounds assessed.  Pt. Complaining of pain in abdomen, morphine given.  Will continue to monitor. Vanice Sarahhompson, Angelene Rome L

## 2013-06-01 NOTE — Progress Notes (Signed)
3 Days Post-Op  Subjective: NG in place Passing flatus and had a BM  Objective: Vital signs in last 24 hours: Temp:  [98.1 F (36.7 C)-98.8 F (37.1 C)] 98.3 F (36.8 C) (01/02 0639) Pulse Rate:  [82-96] 82 (01/02 0639) Resp:  [17-20] 20 (01/02 0639) BP: (131-150)/(84-91) 150/91 mmHg (01/02 0639) SpO2:  [96 %-98 %] 97 % (01/02 0639) Last BM Date: 05/29/13  Intake/Output from previous day: 01/01 0701 - 01/02 0700 In: 1030 [I.V.:1000; NG/GT:30] Out: 1450 [Urine:600; Emesis/NG output:850] Intake/Output this shift:    Comfortable Abdomen distended but non tender  Lab Results:  No results found for this basename: WBC, HGB, HCT, PLT,  in the last 72 hours BMET  Recent Labs  06/01/13 0330  NA 139  K 4.5  CL 104  CO2 25  GLUCOSE 117*  BUN 12  CREATININE 0.74  CALCIUM 8.4   PT/INR No results found for this basename: LABPROT, INR,  in the last 72 hours ABG No results found for this basename: PHART, PCO2, PO2, HCO3,  in the last 72 hours  Studies/Results: Dg Abd 2 Views  05/31/2013   CLINICAL DATA:  Abdominal pain, abdominal distention  EXAM: ABDOMEN - 2 VIEW  COMPARISON:  07/19/2012  FINDINGS: There is gaseous distention of small bowel and colon with multiple small bowel air-fluid levels. There is a nasogastric tube with the tip projecting with systemic. There is no evidence of pneumoperitoneum, portal venous gas, or pneumatosis. There are no pathologic calcifications along the expected course of the ureters. There are midline surgical staples in the lower abdomen. There is evidence of prior cholecystectomy.  The osseous structures are unremarkable.  IMPRESSION: Gaseous distention of small bowel and colon with multiple small bowel air-fluid levels. The findings are likely secondary to a postoperative ileus. Continued radiographic follow-up recommended.   Electronically Signed   By: Elige KoHetal  Patel   On: 05/31/2013 09:51    Anti-infectives: Anti-infectives   Start      Dose/Rate Route Frequency Ordered Stop   05/29/13 1600  ceFAZolin (ANCEF) IVPB 1 g/50 mL premix     1 g 100 mL/hr over 30 Minutes Intravenous 3 times per day 05/29/13 1559 05/29/13 1706   05/29/13 1203  ceFAZolin (ANCEF) 2-3 GM-% IVPB SOLR    Comments:  [name], [name]   : cabinet override      05/29/13 1203 05/29/13 1255   05/29/13 0600  ceFAZolin (ANCEF) IVPB 2 g/50 mL premix  Status:  Discontinued     2 g 100 mL/hr over 30 Minutes Intravenous On call to O.R. 05/28/13 1410 05/29/13 1559      Assessment/Plan: s/p Procedure(s): DIAGNOSTIC LAPAROSCOPY CONVERTED TO EXPLORATORY LAPAROTOMY, LYSIS OF ADHESIONS  (N/A)  Post op ileus  D/c NG ambulate  LOS: 3 days    Jonathan Burke A 06/01/2013

## 2013-06-01 NOTE — Progress Notes (Signed)
Pt. Stomach distended, bowel sounds assessed in all four quadrants.  Pt. In some pain, given prn morphine.  Per pt., Dr. Magnus IvanBlackman came back after lunch to see how pt. Was doing without NG tube.  No new orders.  Will continue to monitor. Vanice Sarahhompson, Camdin Hegner L

## 2013-06-02 LAB — BASIC METABOLIC PANEL
BUN: 13 mg/dL (ref 6–23)
CHLORIDE: 100 meq/L (ref 96–112)
CO2: 25 mEq/L (ref 19–32)
CREATININE: 0.67 mg/dL (ref 0.50–1.35)
Calcium: 8.5 mg/dL (ref 8.4–10.5)
GFR calc non Af Amer: >90 mL/min (ref >90)
Glucose, Bld: 102 mg/dL — ABNORMAL HIGH (ref 70–99)
Potassium: 3.9 mEq/L (ref 3.7–5.3)
Sodium: 137 mEq/L (ref 137–147)

## 2013-06-02 LAB — CBC
HEMATOCRIT: 42.3 % (ref 39.0–52.0)
Hemoglobin: 14.6 g/dL (ref 13.0–17.0)
MCH: 31.9 pg (ref 26.0–34.0)
MCHC: 34.5 g/dL (ref 30.0–36.0)
MCV: 92.6 fL (ref 78.0–100.0)
Platelets: 313 10*3/uL (ref 150–400)
RBC: 4.57 MIL/uL (ref 4.22–5.81)
RDW: 12.5 % (ref 11.5–15.5)
WBC: 9.7 10*3/uL (ref 4.0–10.5)

## 2013-06-02 MED ORDER — SODIUM CHLORIDE 0.9 % IV BOLUS (SEPSIS)
1000.0000 mL | Freq: Once | INTRAVENOUS | Status: AC
  Administered 2013-06-02: 1000 mL via INTRAVENOUS

## 2013-06-02 MED ORDER — PANTOPRAZOLE SODIUM 40 MG IV SOLR
40.0000 mg | INTRAVENOUS | Status: DC
  Administered 2013-06-02 – 2013-06-04 (×3): 40 mg via INTRAVENOUS
  Filled 2013-06-02 (×4): qty 40

## 2013-06-02 NOTE — Progress Notes (Signed)
4 Days Post-Op  Subjective: Emesis x 2 this morning but also had several BM's and is passing flatus  Objective: Vital signs in last 24 hours: Temp:  [97.5 F (36.4 C)-98.3 F (36.8 C)] 98.3 F (36.8 C) (01/03 0528) Pulse Rate:  [75-85] 79 (01/03 0528) Resp:  [18-20] 20 (01/03 0528) BP: (136-141)/(78-86) 141/86 mmHg (01/03 0528) SpO2:  [93 %-97 %] 95 % (01/03 0528) Last BM Date: 06/01/13  Intake/Output from previous day: 01/02 0701 - 01/03 0700 In: 5967.4 [P.O.:240; I.V.:5727.4] Out: -  Intake/Output this shift:    Abdomen less distended, softer, incisions clean  Lab Results:  No results found for this basename: WBC, HGB, HCT, PLT,  in the last 72 hours BMET  Recent Labs  06/01/13 0330  NA 139  K 4.5  CL 104  CO2 25  GLUCOSE 117*  BUN 12  CREATININE 0.74  CALCIUM 8.4   PT/INR No results found for this basename: LABPROT, INR,  in the last 72 hours ABG No results found for this basename: PHART, PCO2, PO2, HCO3,  in the last 72 hours  Studies/Results: Dg Abd 2 Views  05/31/2013   CLINICAL DATA:  Abdominal pain, abdominal distention  EXAM: ABDOMEN - 2 VIEW  COMPARISON:  07/19/2012  FINDINGS: There is gaseous distention of small bowel and colon with multiple small bowel air-fluid levels. There is a nasogastric tube with the tip projecting with systemic. There is no evidence of pneumoperitoneum, portal venous gas, or pneumatosis. There are no pathologic calcifications along the expected course of the ureters. There are midline surgical staples in the lower abdomen. There is evidence of prior cholecystectomy.  The osseous structures are unremarkable.  IMPRESSION: Gaseous distention of small bowel and colon with multiple small bowel air-fluid levels. The findings are likely secondary to a postoperative ileus. Continued radiographic follow-up recommended.   Electronically Signed   By: Elige KoHetal  Patel   On: 05/31/2013 09:51    Anti-infectives: Anti-infectives   Start      Dose/Rate Route Frequency Ordered Stop   05/29/13 1600  ceFAZolin (ANCEF) IVPB 1 g/50 mL premix     1 g 100 mL/hr over 30 Minutes Intravenous 3 times per day 05/29/13 1559 05/29/13 1706   05/29/13 1203  ceFAZolin (ANCEF) 2-3 GM-% IVPB SOLR    Comments:  [name], [name]   : cabinet override      05/29/13 1203 05/29/13 1255   05/29/13 0600  ceFAZolin (ANCEF) IVPB 2 g/50 mL premix  Status:  Discontinued     2 g 100 mL/hr over 30 Minutes Intravenous On call to O.R. 05/28/13 1410 05/29/13 1559      Assessment/Plan: s/p Procedure(s): DIAGNOSTIC LAPAROSCOPY CONVERTED TO EXPLORATORY LAPAROTOMY, LYSIS OF ADHESIONS  (N/A)  Postop ileus  Will hold on NG unless he vomits again Bolus IVF Check labs Continue ambulating  LOS: 4 days    Jonathan Burke A 06/02/2013

## 2013-06-03 MED ORDER — KETOROLAC TROMETHAMINE 30 MG/ML IJ SOLN
30.0000 mg | Freq: Three times a day (TID) | INTRAMUSCULAR | Status: AC
  Administered 2013-06-03 (×3): 30 mg via INTRAVENOUS
  Filled 2013-06-03 (×5): qty 1

## 2013-06-03 MED ORDER — PANTOPRAZOLE SODIUM 40 MG IV SOLR: 40.0000 mg | INTRAVENOUS | Status: DC

## 2013-06-03 NOTE — Progress Notes (Signed)
5 Days Post-Op  Subjective: Continues to slowly improve Passing more gas No nausea or vomiting for 24 hours  Objective: Vital signs in last 24 hours: Temp:  [98.1 F (36.7 C)-98.5 F (36.9 C)] 98.1 F (36.7 C) (01/04 0612) Pulse Rate:  [76-77] 77 (01/04 0612) Resp:  [18-20] 18 (01/04 0612) BP: (134-146)/(80-89) 134/85 mmHg (01/04 0612) SpO2:  [95 %-98 %] 98 % (01/04 0612) Last BM Date: 06/03/13  Intake/Output from previous day: 01/03 0701 - 01/04 0700 In: 4052.5 [P.O.:480; I.V.:3572.5] Out: -  Intake/Output this shift:    Still distended but minimally tender  Lab Results:   Recent Labs  06/02/13 0950  WBC 9.7  HGB 14.6  HCT 42.3  PLT 313   BMET  Recent Labs  06/01/13 0330 06/02/13 0950  NA 139 137  K 4.5 3.9  CL 104 100  CO2 25 25  GLUCOSE 117* 102*  BUN 12 13  CREATININE 0.74 0.67  CALCIUM 8.4 8.5   PT/INR No results found for this basename: LABPROT, INR,  in the last 72 hours ABG No results found for this basename: PHART, PCO2, PO2, HCO3,  in the last 72 hours  Studies/Results: No results found.  Anti-infectives: Anti-infectives   Start     Dose/Rate Route Frequency Ordered Stop   05/29/13 1600  ceFAZolin (ANCEF) IVPB 1 g/50 mL premix     1 g 100 mL/hr over 30 Minutes Intravenous 3 times per day 05/29/13 1559 05/29/13 1706   05/29/13 1203  ceFAZolin (ANCEF) 2-3 GM-% IVPB SOLR    Comments:  [name], [name]   : cabinet override      05/29/13 1203 05/29/13 1255   05/29/13 0600  ceFAZolin (ANCEF) IVPB 2 g/50 mL premix  Status:  Discontinued     2 g 100 mL/hr over 30 Minutes Intravenous On call to O.R. 05/28/13 1410 05/29/13 1559      Assessment/Plan: s/p Procedure(s): DIAGNOSTIC LAPAROSCOPY CONVERTED TO EXPLORATORY LAPAROTOMY, LYSIS OF ADHESIONS  (N/A)  Ileus slowly resolving  Will try full liquids diet  LOS: 5 days    Patterson Hollenbaugh A 06/03/2013

## 2013-06-04 ENCOUNTER — Inpatient Hospital Stay (HOSPITAL_COMMUNITY): Payer: BC Managed Care – PPO

## 2013-06-04 LAB — BASIC METABOLIC PANEL
BUN: 15 mg/dL (ref 6–23)
CALCIUM: 8.2 mg/dL — AB (ref 8.4–10.5)
CO2: 25 mEq/L (ref 19–32)
CREATININE: 0.71 mg/dL (ref 0.50–1.35)
Chloride: 102 mEq/L (ref 96–112)
Glucose, Bld: 91 mg/dL (ref 70–99)
POTASSIUM: 4.2 meq/L (ref 3.7–5.3)
Sodium: 137 mEq/L (ref 137–147)

## 2013-06-04 LAB — CBC
HEMATOCRIT: 38.6 % — AB (ref 39.0–52.0)
Hemoglobin: 13.4 g/dL (ref 13.0–17.0)
MCH: 31.6 pg (ref 26.0–34.0)
MCHC: 34.7 g/dL (ref 30.0–36.0)
MCV: 91 fL (ref 78.0–100.0)
Platelets: 279 10*3/uL (ref 150–400)
RBC: 4.24 MIL/uL (ref 4.22–5.81)
RDW: 12.5 % (ref 11.5–15.5)
WBC: 9.6 10*3/uL (ref 4.0–10.5)

## 2013-06-04 MED ORDER — POTASSIUM CHLORIDE CRYS ER 20 MEQ PO TBCR
20.0000 meq | EXTENDED_RELEASE_TABLET | Freq: Two times a day (BID) | ORAL | Status: DC
Start: 2013-06-04 — End: 2013-06-05
  Administered 2013-06-04 (×2): 20 meq via ORAL
  Filled 2013-06-04 (×4): qty 1

## 2013-06-04 MED ORDER — BISACODYL 10 MG RE SUPP
10.0000 mg | Freq: Once | RECTAL | Status: AC
  Administered 2013-06-04: 10 mg via RECTAL
  Filled 2013-06-04: qty 1

## 2013-06-04 MED ORDER — KETOROLAC TROMETHAMINE 30 MG/ML IJ SOLN
30.0000 mg | Freq: Three times a day (TID) | INTRAMUSCULAR | Status: AC
  Administered 2013-06-04 (×3): 30 mg via INTRAVENOUS
  Filled 2013-06-04 (×3): qty 1

## 2013-06-04 MED ORDER — METOCLOPRAMIDE HCL 5 MG/ML IJ SOLN
10.0000 mg | Freq: Four times a day (QID) | INTRAMUSCULAR | Status: DC
Start: 2013-06-04 — End: 2013-06-05
  Administered 2013-06-04 – 2013-06-05 (×6): 10 mg via INTRAVENOUS
  Filled 2013-06-04 (×10): qty 2

## 2013-06-04 NOTE — Progress Notes (Signed)
6 Days Post-Op  Subjective: Remains distended, uncomfortable  Objective: Vital signs in last 24 hours: Temp:  [98 F (36.7 C)-98.7 F (37.1 C)] 98 F (36.7 C) (01/05 0509) Pulse Rate:  [65-72] 65 (01/05 0509) Resp:  [18-19] 19 (01/05 0509) BP: (110-136)/(70-85) 110/70 mmHg (01/05 0509) SpO2:  [97 %-99 %] 98 % (01/05 0509) Last BM Date: 06/03/13  Intake/Output from previous day: 01/04 0701 - 01/05 0700 In: 2059.6 [I.V.:2059.6] Out: -  Intake/Output this shift: Total I/O In: 2059.6 [I.V.:2059.6] Out: -   Abdomen minimally tender but distended, +BS  Lab Results:   Recent Labs  06/02/13 0950  WBC 9.7  HGB 14.6  HCT 42.3  PLT 313   BMET  Recent Labs  06/02/13 0950  NA 137  K 3.9  CL 100  CO2 25  GLUCOSE 102*  BUN 13  CREATININE 0.67  CALCIUM 8.5   PT/INR No results found for this basename: LABPROT, INR,  in the last 72 hours ABG No results found for this basename: PHART, PCO2, PO2, HCO3,  in the last 72 hours  Studies/Results: No results found.  Anti-infectives: Anti-infectives   Start     Dose/Rate Route Frequency Ordered Stop   05/29/13 1600  ceFAZolin (ANCEF) IVPB 1 g/50 mL premix     1 g 100 mL/hr over 30 Minutes Intravenous 3 times per day 05/29/13 1559 05/29/13 1706   05/29/13 1203  ceFAZolin (ANCEF) 2-3 GM-% IVPB SOLR    Comments:  [name], [name]   : cabinet override      05/29/13 1203 05/29/13 1255   05/29/13 0600  ceFAZolin (ANCEF) IVPB 2 g/50 mL premix  Status:  Discontinued     2 g 100 mL/hr over 30 Minutes Intravenous On call to O.R. 05/28/13 1410 05/29/13 1559      Assessment/Plan: s/p Procedure(s): DIAGNOSTIC LAPAROSCOPY CONVERTED TO EXPLORATORY LAPAROTOMY, LYSIS OF ADHESIONS  (N/A)  prolonged post op ileus  Will increase reglan, give suppos Repeat abdominal xrays Check labs  LOS: 6 days    Farah Lepak A 06/04/2013

## 2013-06-04 NOTE — Consult Note (Signed)
[facility] CONSULT Reason for consult: Prolonged ileus Referring Physician: Dr Rush Farmer  Primary GI? Dr Cristina Gong  Jonathan Burke is an 53 y.o. male.  HPI: patient has had a long history of reflux that has been well-controlled with protonix up until around 1 to 1 1/2 years ago. At that point that began to get worse. He has had several admissions to hospital with what has been presumed to be SBO due to previous appendectomy and cholecystectomy. During this time this esophageal reflux became much worse and he noted increase belching. He had to change his anti-reflux medication. He notes that all during this time he still had bowel movements and was really constipated. He did have some bloody. Due to his continued symptoms radiographic evidence of chronic partial small bowel obstruction he underwent diagnostic laparoscopy with slight adhesions seen and was converted to open laparotomy. A few adhesions were taken down but these were very minimal. The patient is continued to have belching, postprandial bloating. His abdomen is continued to be bloated and abdominal films shown continued ileus. His continued to have heartburn and notes that he has continued to belts despite the use of IV protonix in metoclopramide. We were asked to see him in consultation.  Past Medical History  Diagnosis Date  . Headache(784.0)     tension  . Dental crowns present     also caps  . 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 GERD (gastroesophageal reflux disease)     daily OTC  . Complication of anesthesia     states is hard to wake up  . Small bowel obstruction 07/17/2012  . PONV (postoperative nausea and vomiting)   . Blood in urine   . Enlarged prostate   . H/O hiatal hernia     pt unsure if hernia or not, but has a knot in his stomach    Past Surgical History  Procedure Laterality Date  . 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  Lumbar laminectomy/decompression microdiscectomy  10/29/1999    L5-S1  . Shoulder surgery      left  . Carpal tunnel release  09/09/2011    Procedure: CARPAL TUNNEL RELEASE;  Surgeon: Cammie Sickle., MD;  Location: [place];  Service: Orthopedics;  Laterality: Left;  . Cholecystectomy    . Appendectomy    . Laparoscopic lysis of adhesions N/A 05/29/2013    Procedure: DIAGNOSTIC LAPAROSCOPY CONVERTED TO EXPLORATORY LAPAROTOMY, LYSIS OF ADHESIONS ;  Surgeon: Harl Bowie, MD;  Location: [place];  Service: General;  Laterality: N/A;    Family History  Problem Relation Age of Onset  . Heart attack Father   . Arthritis Sister   . Hypertension Brother   . Hypertension Sister     Social History:  reports that he has never smoked. He has never used smokeless tobacco. He reports that he does not drink alcohol or use illicit drugs.  Allergies:  Allergies  Allergen Reactions  . Adhesive [Tape] Other (See Comments)    PULLS SKIN OFF. Paper tape only    Medications; Prior to Admission medications   Medication Sig Start Date End Date Taking? Authorizing Provider  naproxen sodium (ANAPROX) 220 MG tablet Take 440 mg by mouth 2 (two) times daily with a meal.   Yes Historical Provider, MD  omeprazole (PRILOSEC) 20 MG capsule Take 20 mg by mouth daily.   Yes Historical Provider, MD  tadalafil (  CIALIS) 5 MG tablet Take 5 mg by mouth daily as needed for erectile dysfunction.    Historical Provider, MD   . enoxaparin (LOVENOX) injection  40 mg Subcutaneous Q24H  . ketorolac  30 mg Intravenous Q8H  . metoCLOPramide (REGLAN) injection  10 mg Intravenous Q6H  . pantoprazole (PROTONIX) IV  40 mg Intravenous Q24H  . potassium chloride  20 mEq Oral BID   PRN Meds bisacodyl, morphine injection, ondansetron (ZOFRAN) IV, ondansetron, promethazine Results for orders placed during the hospital encounter of 05/29/13 (from the past 48 hour(s))  CBC     Status: Abnormal   Collection  Time    06/04/13  8:08 AM      Result Value Range   WBC 9.6  4.0 - 10.5 K/uL   RBC 4.24  4.22 - 5.81 MIL/uL   Hemoglobin 13.4  13.0 - 17.0 g/dL   HCT 38.6 (*) 39.0 - 52.0 %   MCV 91.0  78.0 - 100.0 fL   MCH 31.6  26.0 - 34.0 pg   MCHC 34.7  30.0 - 36.0 g/dL   RDW 12.5  11.5 - 15.5 %   Platelets 279  150 - 400 K/uL  BASIC METABOLIC PANEL     Status: Abnormal   Collection Time    06/04/13  8:08 AM      Result Value Range   Sodium 137  137 - 147 mEq/L   Potassium 4.2  3.7 - 5.3 mEq/L   Chloride 102  96 - 112 mEq/L   CO2 25  19 - 32 mEq/L   Glucose, Bld 91  70 - 99 mg/dL   BUN 15  6 - 23 mg/dL   Creatinine, Ser 0.71  0.50 - 1.35 mg/dL   Calcium 8.2 (*) 8.4 - 10.5 mg/dL   GFR calc non Af Amer >90  >90 mL/min   GFR calc Af Amer >90  >90 mL/min   Comment: (NOTE)     The eGFR has been calculated using the CKD EPI equation.     This calculation has not been validated in all clinical situations.     eGFR's persistently <90 mL/min signify possible Chronic Kidney     Disease.    Dg Abd 2 Views  06/04/2013   CLINICAL DATA:  Abdominal discomfort  EXAM: ABDOMEN - 2 VIEW  COMPARISON:  05/31/2013  FINDINGS: Multiple dilated loops of small bowel are again noted. There are numerous air-fluid levels on the upright images. Compared with previous exam the degree of bowel distention is slightly increased in the interval.  IMPRESSION: 1. Mild progression of ileus pattern.   Electronically Signed   By: Kerby Moors M.D.   On: 06/04/2013 08:45                Blood pressure 110/70, pulse 65, temperature 98 F (36.7 C), temperature source Oral, resp. rate 19, height 5' 8"  (1.727 m), weight 103.556 kg (228 lb 4.8 oz), SpO2 98.00%.  Physical exam:   General-- pleasant white male in no distress. Patient is belching continuously during the exam Heart-- regular rate and rhythm without murmurs are gallops Lungs--clear Abdomen-- distended with few bowel sounds minimally  tender   Assessment: 1. Abdominal bloating. This is really quite confusing since symptoms and radiographic studies are all suggestive of chronic SBO. Laparotomy however showed minimal adhesions. His films now suggest continued ileus and he has failed to improve. His potassium is borderline normal. He denies any constipation or trouble with bowel movements and  apparently has been passing gas and having bowel movements without difficulty. Whatever the process, is lower G.I. track appears to be functioning reasonably normal. 2. Chronic esophageal reflux. He said this for some time but it has gotten much worse recently and this could all be a result of poor motility of the small intestines. He also could have a gastric outlet obstruction or proximal small bowel obstruction confusing things.  Plan: I seriously doubt that there is a mechanical blockage to a small bowel since he has had his entire small bowel run at surgery and any adhesions removed. Apparently there was minimal adhesions. His films to suggest ileus continually. I think we need to do EGD. We'll try to keep his potassium up about 5 for several days to see if that will help.   Cyril Woodmansee JR,Taleyah Hillman L 06/04/2013, 12:20 PM

## 2013-06-05 ENCOUNTER — Encounter (HOSPITAL_COMMUNITY)
Admission: RE | Disposition: A | Discharge status: Home / Self Care | Payer: Self-pay | Source: Ambulatory Visit | Attending: Surgery

## 2013-06-05 ENCOUNTER — Encounter (HOSPITAL_COMMUNITY): Payer: Self-pay | Admitting: Gastroenterology

## 2013-06-05 HISTORY — PX: ESOPHAGOGASTRODUODENOSCOPY: SHX5428

## 2013-06-05 LAB — BASIC METABOLIC PANEL
BUN: 17 mg/dL (ref 6–23)
CALCIUM: 8.5 mg/dL (ref 8.4–10.5)
CO2: 24 mEq/L (ref 19–32)
CREATININE: 0.81 mg/dL (ref 0.50–1.35)
Chloride: 99 mEq/L (ref 96–112)
Glucose, Bld: 85 mg/dL (ref 70–99)
Potassium: 4.1 mEq/L (ref 3.7–5.3)
Sodium: 136 mEq/L — ABNORMAL LOW (ref 137–147)

## 2013-06-05 LAB — MAGNESIUM: Magnesium: 2 mg/dL (ref 1.5–2.5)

## 2013-06-05 SURGERY — EGD (ESOPHAGOGASTRODUODENOSCOPY)
Anesthesia: Moderate Sedation

## 2013-06-05 MED ORDER — BUTAMBEN-TETRACAINE-BENZOCAINE 2-2-14 % EX AERO
INHALATION_SPRAY | CUTANEOUS | Status: DC | PRN
  Administered 2013-06-05: 2 via TOPICAL

## 2013-06-05 MED ORDER — MIDAZOLAM HCL 5 MG/ML IJ SOLN
INTRAMUSCULAR | Status: AC
  Filled 2013-06-05: qty 2

## 2013-06-05 MED ORDER — PANTOPRAZOLE SODIUM 40 MG PO TBEC: 40.0000 mg | DELAYED_RELEASE_TABLET | Freq: Every day | ORAL | Status: DC

## 2013-06-05 MED ORDER — MIDAZOLAM HCL 10 MG/2ML IJ SOLN
INTRAMUSCULAR | Status: DC | PRN
  Administered 2013-06-05: 2 mg via INTRAVENOUS
  Administered 2013-06-05: 1 mg via INTRAVENOUS
  Administered 2013-06-05 (×2): 2 mg via INTRAVENOUS

## 2013-06-05 MED ORDER — FENTANYL CITRATE 0.05 MG/ML IJ SOLN
INTRAMUSCULAR | Status: AC
  Filled 2013-06-05: qty 2

## 2013-06-05 MED ORDER — METOCLOPRAMIDE HCL 10 MG PO TABS: 10.0000 mg | ORAL_TABLET | Freq: Three times a day (TID) | ORAL | Status: DC

## 2013-06-05 MED ORDER — PANTOPRAZOLE SODIUM 40 MG PO TBEC
40.0000 mg | DELAYED_RELEASE_TABLET | Freq: Every day | ORAL | Status: DC
  Administered 2013-06-05: 40 mg via ORAL
  Filled 2013-06-05: qty 1

## 2013-06-05 MED ORDER — FENTANYL CITRATE 0.05 MG/ML IJ SOLN
INTRAMUSCULAR | Status: DC | PRN
  Administered 2013-06-05 (×3): 25 ug via INTRAVENOUS

## 2013-06-05 MED ORDER — POTASSIUM CHLORIDE CRYS ER 20 MEQ PO TBCR
40.0000 meq | EXTENDED_RELEASE_TABLET | Freq: Two times a day (BID) | ORAL | Status: DC
  Administered 2013-06-05: 40 meq via ORAL
  Filled 2013-06-05: qty 2

## 2013-06-05 MED ORDER — SODIUM CHLORIDE 0.9 % IV SOLN
INTRAVENOUS | Status: DC
  Administered 2013-06-05: 500 mL via INTRAVENOUS

## 2013-06-05 NOTE — Discharge Instructions (Signed)
Esophagogastroduodenoscopy Care After Refer to this sheet in the next few weeks. These instructions provide you with information on caring for yourself after your procedure. Your caregiver may also give you more specific instructions. Your treatment has been planned according to current medical practices, but problems sometimes occur. Call your caregiver if you have any problems or questions after your procedure.  HOME CARE INSTRUCTIONS  Do not eat or drink anything until the numbing medicine (local anesthetic) has worn off and your gag reflex has returned. You will know that the local anesthetic has worn off when you can swallow comfortably.  Do not drive for 12 hours after the procedure or as directed by your caregiver.  Only take medicines as directed by your caregiver. SEEK MEDICAL CARE IF:   You cannot stop coughing.  You are not urinating at all or less than usual. SEEK IMMEDIATE MEDICAL CARE IF:  You have difficulty swallowing.  You cannot eat or drink.  You have worsening throat or chest pain.  You have dizziness, lightheadedness, or you faint.  You have nausea or vomiting.  You have chills.  You have a fever.  You have severe abdominal pain.  You have black, tarry, or bloody stools. Document Released: 05/03/2012 Document Reviewed: 05/03/2012 [facility] Patient Information 2014 [place], [place]. Esophagogastroduodenoscopy Esophagogastroduodenoscopy (EGD) is a procedure to examine the lining of the esophagus, stomach, and first part of the small intestine (duodenum). A long, flexible, lighted tube with a camera attached (endoscope) is inserted down the throat to view these organs. This procedure is done to detect problems or abnormalities, such as inflammation, bleeding, ulcers, or growths, in order to treat them. The procedure lasts about 5 20 minutes. It is usually an outpatient procedure, but it may need to be performed in emergency cases in the hospital. LET YOUR  CAREGIVER KNOW ABOUT:   Allergies to food or medicine.  All medicines you are taking, including vitamins, herbs, eyedrops, and over-the-counter medicines and creams.  Use of steroids (by mouth or creams).  Previous problems you or members of your family have had with the use of anesthetics.  Any blood disorders you have.  Previous surgeries you have had.  Other health problems you have.  Possibility of pregnancy, if this applies. RISKS AND COMPLICATIONS  Generally, EGD is a safe procedure. However, as with any procedure, complications can occur. Possible complications include:  Infection.  Bleeding.  Tearing (perforation) of the esophagus, stomach, or duodenum.  Difficulty breathing or not being able to breath.  Excessive sweating.  Spasms of the larynx.  Slowed heartbeat.  Low blood pressure. BEFORE THE PROCEDURE  Do not eat or drink anything for 6 8 hours before the procedure or as directed by your caregiver.  Ask your caregiver about changing or stopping your regular medicines.  If you wear dentures, be prepared to remove them before the procedure.  Arrange for someone to drive you home after the procedure. PROCEDURE   A vein will be accessed to give medicines and fluids. A medicine to relax you (sedative) and a pain reliever will be given through that access into the vein.  A numbing medicine (local anesthetic) may be sprayed on your throat for comfort and to stop you from gagging or coughing.  A mouth guard may be placed in your mouth to protect your teeth and to keep you from biting on the endoscope.  You will be asked to lie on your left side.  The endoscope is inserted down your throat and into  the esophagus, stomach, and duodenum.  Air is put through the endoscope to allow your caregiver to view the lining of your esophagus clearly.  The esophagus, stomach, and duodenum is then examined. During the exam, your caregiver may:  Remove tissue to be  examined under a microscope (biopsy) for inflammation, infection, or other medical problems.  Remove growths.  Remove objects (foreign bodies) that are stuck.  Treat any bleeding with medicines or other devices that stop tissues from bleeding (hot cauters, clipping devices).  Widen (dilate) or stretch narrowed areas of the esophagus and stomach.  The endoscope will then be withdrawn. AFTER THE PROCEDURE  You will be taken to a recovery area to be monitored. You will be able to go home once you are stable and alert.  Do not eat or drink anything until the local anesthetic and numbing medicines have worn off. You may choke.  It is normal to feel bloated, have pain with swallowing, or have a sore throat for a short time. This will wear off.  Your caregiver should be able to discuss his or her findings with you. It will take longer to discuss the test results if any biopsies were taken. Document Released: 09/17/2004 Document Revised: 05/03/2012 Document Reviewed: 04/19/2012 [facility] Patient Information 2014 [place], [place]. [employer]      [facility], [place] [phone]  OPEN ABDOMINAL SURGERY: POST OP INSTRUCTIONS  Always review your discharge instruction sheet given to you by the facility where your surgery was performed.  IF YOU HAVE DISABILITY OR FAMILY LEAVE FORMS, YOU MUST BRING THEM TO THE OFFICE FOR PROCESSING.  PLEASE DO NOT GIVE THEM TO YOUR DOCTOR.  1. A prescription for pain medication may be given to you upon discharge.  Take your pain medication as prescribed, if needed.  If narcotic pain medicine is not needed, then you may take acetaminophen (Tylenol) or ibuprofen (Advil) as needed. 2. Take your usually prescribed medications unless otherwise directed. 3. If you need a refill on your pain medication, please contact your pharmacy. They will contact our office to request authorization.  Prescriptions will not be filled after 5pm or on week-ends. 4. You should follow a  light diet the first few days after arrival home, such as soup and crackers, pudding, etc.unless your doctor has advised otherwise. A high-fiber, low fat diet can be resumed as tolerated.   Be sure to include lots of fluids daily. Most patients will experience some swelling and bruising on the chest and neck area.  Ice packs will help.  Swelling and bruising can take several days to resolve 5. Most patients will experience some swelling and bruising in the area of the incision. Ice pack will help. Swelling and bruising can take several days to resolve..  6. It is common to experience some constipation if taking pain medication after surgery.  Increasing fluid intake and taking a stool softener will usually help or prevent this problem from occurring.  A mild laxative (Milk of Magnesia or Miralax) should be taken according to package directions if there are no bowel movements after 48 hours. 7.  You may have steri-strips (small skin tapes) in place directly over the incision.  These strips should be left on the skin for 7-10 days.  If your surgeon used skin glue on the incision, you may shower in 24 hours.  The glue will flake off over the next 2-3 weeks.  Any sutures or staples will be removed at the office during your follow-up visit. You may  find that a light gauze bandage over your incision may keep your staples from being rubbed or pulled. You may shower and replace the bandage daily. 8. ACTIVITIES:  You may resume regular (light) daily activities beginning the next day--such as daily self-care, walking, climbing stairs--gradually increasing activities as tolerated.  You may have sexual intercourse when it is comfortable.  Refrain from any heavy lifting or straining until approved by your doctor. a. You may drive when you no longer are taking prescription pain medication, you can comfortably wear a seatbelt, and you can safely maneuver your car and apply brakes b. Return to Work:  ___________________________________ 9. You should see your doctor in the office for a follow-up appointment approximately two weeks after your surgery.  Make sure that you call for this appointment within a day or two after you arrive home to insure a convenient appointment time. OTHER INSTRUCTIONS:  _________________________NO LIFTING MORE THAN 15 POUNDS FOR 5 WEEKS____________________________________ _____________________________________________________________  WHEN TO CALL YOUR DOCTOR: 1. Fever over 101.0 2. Inability to urinate 3. Nausea and/or vomiting 4. Extreme swelling or bruising 5. Continued bleeding from incision. 6. Increased pain, redness, or drainage from the incision. 7. Difficulty swallowing or breathing 8. Muscle cramping or spasms. 9. Numbness or tingling in hands or feet or around lips.  The clinic staff is available to answer your questions during regular business hours.  Please dont hesitate to call and ask to speak to one of the nurses if you have concerns.  For further questions, please visit [email or web address]

## 2013-06-05 NOTE — Progress Notes (Signed)
7 Days Post-Op  Subjective: He is feeling much better today Having multiple BM's and passing flatus  Objective: Vital signs in last 24 hours: Temp:  [97.9 F (36.6 C)-98.2 F (36.8 C)] 98.2 F (36.8 C) (01/06 0543) Pulse Rate:  [70-77] 70 (01/06 0543) Resp:  [18-20] 20 (01/06 0543) BP: (123-128)/(70-73) 125/73 mmHg (01/06 0543) SpO2:  [95 %-98 %] 95 % (01/06 0543) Last BM Date: 06/04/13  Intake/Output from previous day: 01/05 0701 - 01/06 0700 In: 1849.2 [I.V.:1849.2] Out: -  Intake/Output this shift:    Abdomen significantly softer, much less distended  Lab Results:   Recent Labs  06/02/13 0950 06/04/13 0808  WBC 9.7 9.6  HGB 14.6 13.4  HCT 42.3 38.6*  PLT 313 279   BMET  Recent Labs  06/04/13 0808 06/05/13 0600  NA 137 136*  K 4.2 4.1  CL 102 99  CO2 25 24  GLUCOSE 91 85  BUN 15 17  CREATININE 0.71 0.81  CALCIUM 8.2* 8.5   PT/INR No results found for this basename: LABPROT, INR,  in the last 72 hours ABG No results found for this basename: PHART, PCO2, PO2, HCO3,  in the last 72 hours  Studies/Results: Dg Abd 2 Views  06/04/2013   CLINICAL DATA:  Abdominal discomfort  EXAM: ABDOMEN - 2 VIEW  COMPARISON:  05/31/2013  FINDINGS: Multiple dilated loops of small bowel are again noted. There are numerous air-fluid levels on the upright images. Compared with previous exam the degree of bowel distention is slightly increased in the interval.  IMPRESSION: 1. Mild progression of ileus pattern.   Electronically Signed   By: Signa Kellaylor  Stroud M.D.   On: 06/04/2013 08:45    Anti-infectives: Anti-infectives   Start     Dose/Rate Route Frequency Ordered Stop   05/29/13 1600  ceFAZolin (ANCEF) IVPB 1 g/50 mL premix     1 g 100 mL/hr over 30 Minutes Intravenous 3 times per day 05/29/13 1559 05/29/13 1706   05/29/13 1203  ceFAZolin (ANCEF) 2-3 GM-% IVPB SOLR    Comments:  [name], [name]   : cabinet override      05/29/13 1203 05/29/13 1255   05/29/13 0600  ceFAZolin  (ANCEF) IVPB 2 g/50 mL premix  Status:  Discontinued     2 g 100 mL/hr over 30 Minutes Intravenous On call to O.R. 05/28/13 1410 05/29/13 1559      Assessment/Plan: s/p Procedure(s): DIAGNOSTIC LAPAROSCOPY CONVERTED TO EXPLORATORY LAPAROTOMY, LYSIS OF ADHESIONS  (N/A)  Resolving ileus  EGD today and then resume diet May be able to discharge this evening  LOS: 7 days    Jonathan Burke A 06/05/2013

## 2013-06-05 NOTE — Op Note (Signed)
[facility] [address] [place], [id number]   ENDOSCOPY PROCEDURE REPORT  PATIENT: Jonathan Burke, Jonathan W.  MR#: [id number] BIRTHDATE: 02/23/1961 , 52  yrs. old GENDER: Male ENDOSCOPIST:Taishaun Levels Randa EvensEdwards, MD REFERRED BY: Dr. Magnus IvanBlackman PROCEDURE DATE:  06/05/2013 PROCEDURE:  EGD ASA CLASS:  class 2 INDICATIONS: persistent nausea, bloating, burping MEDICATION:    fentanyl 75 mcg, versed 7 mg IV TOPICAL ANESTHETIC:  cetacaine spray  DESCRIPTION OF PROCEDURE:   The Pentax adult upper scope was inserted into the esophagus with swallowing in advance. We advance to the full extent of the scope down to the 2nd to 3rd duodenum. There were no abnormality seen. The duodenum and stomach did appear to be somewhat dilated. The mucosa in the 2nd duodenum and duodenal bulb was normal. There were no ulceration or inflammation. Pyloric channel was normal. Stomach examine carefully in the forward and retroflex view was normal. The GE junction was normal. The esophagus was endoscopically normal but no ulceration or inflammation. The scope was withdrawn the patient tolerated procedure well. There were no immediate complications.     COMPLICATIONS: None  ENDOSCOPIC IMPRESSION: 1. Prolonged Ileus/loading/belching. No signs of gastric outlet obstruction or any gross abnormalities of the small bowel or stomach.  RECOMMENDATIONS: would continue to give him liquids and try to get his potassium up to over 5. Would continue IV metoclopramide Hopefully he will continue to improve.    _______________________________ eSigned:  Carman ChingJames Shanyce Daris, MD 06/05/2013 11:45 AM

## 2013-06-05 NOTE — Interval H&P Note (Signed)
History and Physical Interval Note:  06/05/2013 11:19 AM  Jonathan Burke  has presented today for surgery, with the diagnosis of nausea  The various methods of treatment have been discussed with the patient and family. After consideration of risks, benefits and other options for treatment, the patient has consented to  Procedure(s): ESOPHAGOGASTRODUODENOSCOPY (EGD) (N/A) as a surgical intervention .  The patient's history has been reviewed, patient examined, no change in status, stable for surgery.  I have reviewed the patient's chart and labs.  Questions were answered to the patient's satisfaction.     [name],Brenin Heidelberger L

## 2013-06-05 NOTE — Progress Notes (Signed)
Patient ID: Jonathan Burke, male   DOB: 11/17/1960, 53 y.o.   MRN: [id number]  He continues to feel better and really wants to go home Again, his abdomen is less distended  Will discharge home

## 2013-06-05 NOTE — Discharge Planning (Signed)
Patient discharged home in stable condition. Verbalizes understanding of all discharge instructions, including home medications and follow up appointments. 

## 2013-06-05 NOTE — Discharge Summary (Signed)
Physician Discharge Summary  Patient ID: Jonathan Burke MRN: [id number] DOB/AGE: 53/09/1960 53 y.o.  Admit date: 05/29/2013 Discharge date: 06/05/2013  Admission Diagnoses:  Discharge Diagnoses:  Active Problems:   SBO (small bowel obstruction) POSTOP ILEUS  Discharged Condition: fair  Hospital Course: admitted post op.  Developed significant ileus which was slow to improve.  GI consulted.  EGD negative.  Slowly improved so requested discharge  Consults: GI  Significant Diagnostic Studies:   Treatments: surgery: diagnostic lap, open laparotomy with lysis of adhesions  Discharge Exam: Blood pressure 121/92, pulse 88, temperature 98.6 F (37 C), temperature source Oral, resp. rate 16, height 5\' 8"  (1.727 m), weight 228 lb 4.8 oz (103.556 kg), SpO2 98.00%. General appearance: alert, cooperative and no distress Incision/Wound: slightly distended, non tender, incision healing  Disposition: 01-Home or Self Care     Medication List         metoCLOPramide 10 MG tablet  Commonly known as:  REGLAN  Take 1 tablet (10 mg total) by mouth 3 (three) times daily before meals.     naproxen sodium 220 MG tablet  Commonly known as:  ANAPROX  Take 440 mg by mouth 2 (two) times daily with a meal.     omeprazole 20 MG capsule  Commonly known as:  PRILOSEC  Take 20 mg by mouth daily.     pantoprazole 40 MG tablet  Commonly known as:  PROTONIX  Take 1 tablet (40 mg total) by mouth daily.     tadalafil 5 MG tablet  Commonly known as:  CIALIS  Take 5 mg by mouth daily as needed for erectile dysfunction.           Follow-up Information   Follow up with Shelly RubensteinBLACKMAN,Oluwadara Gorman A, MD On 06/08/2013. ([facility])    Specialty:  General Surgery   Contact information:   [address] [place] [address] [phone]       Signed: Shelly RubensteinBLACKMAN,Magdelyn Roebuck A 06/05/2013, 5:34 PM

## 2013-06-05 NOTE — H&P (View-Only) (Signed)
[facility] CONSULT Reason for consult: Prolonged ileus Referring Physician: Dr Rush Farmer  Primary GI? Dr Cristina Gong  Jonathan Burke is an 53 y.o. male.  HPI: patient has had a long history of reflux that has been well-controlled with protonix up until around 1 to 1 1/2 years ago. At that point that began to get worse. He has had several admissions to hospital with what has been presumed to be SBO due to previous appendectomy and cholecystectomy. During this time this esophageal reflux became much worse and he noted increase belching. He had to change his anti-reflux medication. He notes that all during this time he still had bowel movements and was really constipated. He did have some bloody. Due to his continued symptoms radiographic evidence of chronic partial small bowel obstruction he underwent diagnostic laparoscopy with slight adhesions seen and was converted to open laparotomy. A few adhesions were taken down but these were very minimal. The patient is continued to have belching, postprandial bloating. His abdomen is continued to be bloated and abdominal films shown continued ileus. His continued to have heartburn and notes that he has continued to belts despite the use of IV protonix in metoclopramide. We were asked to see him in consultation.  Past Medical History  Diagnosis Date  . Headache(784.0)     tension  . Dental crowns present     also caps  . 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 GERD (gastroesophageal reflux disease)     daily OTC  . Complication of anesthesia     states is hard to wake up  . Small bowel obstruction 07/17/2012  . PONV (postoperative nausea and vomiting)   . Blood in urine   . Enlarged prostate   . H/O hiatal hernia     pt unsure if hernia or not, but has a knot in his stomach    Past Surgical History  Procedure Laterality Date  . 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  Lumbar laminectomy/decompression microdiscectomy  10/29/1999    L5-S1  . Shoulder surgery      left  . Carpal tunnel release  09/09/2011    Procedure: CARPAL TUNNEL RELEASE;  Surgeon: Cammie Sickle., MD;  Location: [facility];  Service: Orthopedics;  Laterality: Left;  . Cholecystectomy    . Appendectomy    . Laparoscopic lysis of adhesions N/A 05/29/2013    Procedure: DIAGNOSTIC LAPAROSCOPY CONVERTED TO EXPLORATORY LAPAROTOMY, LYSIS OF ADHESIONS ;  Surgeon: Harl Bowie, MD;  Location: [facility];  Service: General;  Laterality: N/A;    Family History  Problem Relation Age of Onset  . Heart attack Father   . Arthritis Sister   . Hypertension Brother   . Hypertension Sister     Social History:  reports that he has never smoked. He has never used smokeless tobacco. He reports that he does not drink alcohol or use illicit drugs.  Allergies:  Allergies  Allergen Reactions  . Adhesive [Tape] Other (See Comments)    PULLS SKIN OFF. Paper tape only    Medications; Prior to Admission medications   Medication Sig Start Date End Date Taking? Authorizing Provider  naproxen sodium (ANAPROX) 220 MG tablet Take 440 mg by mouth 2 (two) times daily with a meal.   Yes Historical Provider, MD  omeprazole (PRILOSEC) 20 MG capsule Take 20 mg by mouth daily.   Yes Historical Provider, MD  tadalafil (  CIALIS) 5 MG tablet Take 5 mg by mouth daily as needed for erectile dysfunction.    Historical Provider, MD   . enoxaparin (LOVENOX) injection  40 mg Subcutaneous Q24H  . ketorolac  30 mg Intravenous Q8H  . metoCLOPramide (REGLAN) injection  10 mg Intravenous Q6H  . pantoprazole (PROTONIX) IV  40 mg Intravenous Q24H  . potassium chloride  20 mEq Oral BID   PRN Meds bisacodyl, morphine injection, ondansetron (ZOFRAN) IV, ondansetron, promethazine Results for orders placed during the hospital encounter of 05/29/13 (from the past 48 hour(s))  CBC     Status: Abnormal   Collection  Time    06/04/13  8:08 AM      Result Value Range   WBC 9.6  4.0 - 10.5 K/uL   RBC 4.24  4.22 - 5.81 MIL/uL   Hemoglobin 13.4  13.0 - 17.0 g/dL   HCT 38.6 (*) 39.0 - 52.0 %   MCV 91.0  78.0 - 100.0 fL   MCH 31.6  26.0 - 34.0 pg   MCHC 34.7  30.0 - 36.0 g/dL   RDW 12.5  11.5 - 15.5 %   Platelets 279  150 - 400 K/uL  BASIC METABOLIC PANEL     Status: Abnormal   Collection Time    06/04/13  8:08 AM      Result Value Range   Sodium 137  137 - 147 mEq/L   Potassium 4.2  3.7 - 5.3 mEq/L   Chloride 102  96 - 112 mEq/L   CO2 25  19 - 32 mEq/L   Glucose, Bld 91  70 - 99 mg/dL   BUN 15  6 - 23 mg/dL   Creatinine, Ser 0.71  0.50 - 1.35 mg/dL   Calcium 8.2 (*) 8.4 - 10.5 mg/dL   GFR calc non Af Amer >90  >90 mL/min   GFR calc Af Amer >90  >90 mL/min   Comment: (NOTE)     The eGFR has been calculated using the CKD EPI equation.     This calculation has not been validated in all clinical situations.     eGFR's persistently <90 mL/min signify possible Chronic Kidney     Disease.    Dg Abd 2 Views  06/04/2013   CLINICAL DATA:  Abdominal discomfort  EXAM: ABDOMEN - 2 VIEW  COMPARISON:  05/31/2013  FINDINGS: Multiple dilated loops of small bowel are again noted. There are numerous air-fluid levels on the upright images. Compared with previous exam the degree of bowel distention is slightly increased in the interval.  IMPRESSION: 1. Mild progression of ileus pattern.   Electronically Signed   By: Kerby Moors M.D.   On: 06/04/2013 08:45                Blood pressure 110/70, pulse 65, temperature 98 F (36.7 C), temperature source Oral, resp. rate 19, height 5' 8"  (1.727 m), weight 103.556 kg (228 lb 4.8 oz), SpO2 98.00%.  Physical exam:   General-- pleasant white male in no distress. Patient is belching continuously during the exam Heart-- regular rate and rhythm without murmurs are gallops Lungs--clear Abdomen-- distended with few bowel sounds minimally  tender   Assessment: 1. Abdominal bloating. This is really quite confusing since symptoms and radiographic studies are all suggestive of chronic SBO. Laparotomy however showed minimal adhesions. His films now suggest continued ileus and he has failed to improve. His potassium is borderline normal. He denies any constipation or trouble with bowel movements and  apparently has been passing gas and having bowel movements without difficulty. Whatever the process, is lower G.I. track appears to be functioning reasonably normal. 2. Chronic esophageal reflux. He said this for some time but it has gotten much worse recently and this could all be a result of poor motility of the small intestines. He also could have a gastric outlet obstruction or proximal small bowel obstruction confusing things.  Plan: I seriously doubt that there is a mechanical blockage to a small bowel since he has had his entire small bowel run at surgery and any adhesions removed. Apparently there was minimal adhesions. His films to suggest ileus continually. I think we need to do EGD. We'll try to keep his potassium up about 5 for several days to see if that will help.   Majorie Santee JR,[place] L 06/04/2013, 12:20 PM

## 2013-06-06 ENCOUNTER — Encounter (HOSPITAL_COMMUNITY): Payer: Self-pay | Admitting: Gastroenterology

## 2013-06-07 ENCOUNTER — Telehealth (INDEPENDENT_AMBULATORY_CARE_PROVIDER_SITE_OTHER): Payer: Self-pay | Admitting: General Surgery

## 2013-06-07 ENCOUNTER — Telehealth (INDEPENDENT_AMBULATORY_CARE_PROVIDER_SITE_OTHER): Payer: Self-pay

## 2013-06-07 NOTE — Telephone Encounter (Signed)
Pt's wife returned call to [facility]; was a reminder call for appt tomorrow with Dr. Magnus IvanBlackman at 12:00.  Will remove staples then.  She understands and they will be here.

## 2013-06-07 NOTE — Telephone Encounter (Signed)
Pts wife called stating pt c/o abd pain that started earlier today and is now vomiting and having abd bloating. I reviewed this with Dr Magnus IvanBlackman. Per Dr Eliberto IvoryBlackman's request pts wife advised [facility] r/o ileus. Pt declines going to Er. I stressed importance of going for work up. Wife states pt refuses to go to Er. She states pt will go later if not improving. I advised her to keep pt on clear liquids only until improved or until pt goes to ER.  I advised her that I will send this msg to Dr Magnus IvanBlackman.

## 2013-06-08 ENCOUNTER — Other Ambulatory Visit (INDEPENDENT_AMBULATORY_CARE_PROVIDER_SITE_OTHER): Payer: Self-pay | Admitting: Surgery

## 2013-06-08 ENCOUNTER — Ambulatory Visit (INDEPENDENT_AMBULATORY_CARE_PROVIDER_SITE_OTHER): Payer: BC Managed Care – PPO | Admitting: Surgery

## 2013-06-08 DIAGNOSIS — K56609 Unspecified intestinal obstruction, unspecified as to partial versus complete obstruction: Secondary | ICD-10-CM

## 2013-06-08 DIAGNOSIS — Z09 Encounter for follow-up examination after completed treatment for conditions other than malignant neoplasm: Secondary | ICD-10-CM

## 2013-06-08 MED ORDER — HYDROMORPHONE HCL 4 MG PO TABS: 2.0000 mg | ORAL_TABLET | ORAL | Status: DC | PRN

## 2013-06-08 MED ORDER — ONDANSETRON HCL 4 MG PO TABS: 4.0000 mg | ORAL_TABLET | Freq: Three times a day (TID) | ORAL | Status: DC | PRN

## 2013-06-08 NOTE — Progress Notes (Signed)
Subjective:     Patient ID: Jonathan Burke, male   DOB: 05/29/1961, 53 y.o.   MRN: [id number]  HPI He is here for his first postop visit. He has been home for approximately 3 days. Last night he had recurrent cramping abdominal pain with nausea and vomiting. He is still passing gas and moving his bowels. He denies fevers or chills.  Review of Systems     Objective:   Physical Exam On exam, he is well-appearing today. His abdomen is softer and nontender. I removed his staples and place Steri-Strips    Assessment:     Patient status post exploratory lap for small bowel obstruction with prolonged ileus     Plan:     Again, at the time of surgery, minimal adhesions were identified. He had a prolonged ileus postoperatively and is still persists. I believe he will need further investigation of his gastrointestinal track with a upper GI small bowel follow-through and/or capsule endoscopy. We will get him an appointment with Dr. Matthias HughsBuccini who saw him preoperatively. I will see him back in a week unless the episode from last night recurs and I will have to admit him to the hospital

## 2013-06-11 ENCOUNTER — Telehealth (INDEPENDENT_AMBULATORY_CARE_PROVIDER_SITE_OTHER): Payer: Self-pay | Admitting: *Deleted

## 2013-06-11 NOTE — Telephone Encounter (Signed)
Left detailed message with pts appt information with Dr. Matthias HughsBuccini.  He is scheduled on 06/27/13 @ 3:45pm.  Told him to call with any further questions.

## 2013-06-18 ENCOUNTER — Telehealth (INDEPENDENT_AMBULATORY_CARE_PROVIDER_SITE_OTHER): Payer: Self-pay

## 2013-06-18 NOTE — Telephone Encounter (Signed)
Patient's wife called reporting she noticed Saturday that his incision at the bottom has slightly opened and is draining clear fluid.  It was mixed a little with blood.  He has no fever and no pain.  It is slightly red.  I advised make sure he is showering and keeping the area clean and dry.  Cover daily with a Band Aid or gauze dressing.  Watch the area and call us in a couple days if not better or worse.  He has a follow up appointment on 1/26

## 2013-06-25 ENCOUNTER — Ambulatory Visit (INDEPENDENT_AMBULATORY_CARE_PROVIDER_SITE_OTHER): Payer: BC Managed Care – PPO | Admitting: Surgery

## 2013-06-25 ENCOUNTER — Encounter (INDEPENDENT_AMBULATORY_CARE_PROVIDER_SITE_OTHER): Payer: Self-pay | Admitting: Surgery

## 2013-06-25 VITALS — BP 125/78 | HR 70 | Temp 98.4°F | Resp 18 | Ht 68.0 in | Wt 211.6 lb

## 2013-06-25 DIAGNOSIS — Z09 Encounter for follow-up examination after completed treatment for conditions other than malignant neoplasm: Secondary | ICD-10-CM

## 2013-06-25 NOTE — Progress Notes (Signed)
Subjective:     Patient ID: Jonathan Burke, male   DOB: 11/19/1960, 53 y.o.   MRN: [id number]  HPI He is here for another postoperative visit. Again, he had a diagnostic laparoscopy converted to an open laparotomy. Surprisingly, minimal scar tissue was found around the terminal ileum but no where else in the abdomen. I found no other causes for the symptoms he has been having over the past year. He had a prolonged postoperative ileus. He is however improving  Review of Systems     Objective:   Physical Exam The 2 small areas that were open all his wounds are healing. His abdomen is soft    Assessment:     Patient stable postop     Plan:     He will be seeing Dr. Matthias HughsBuccini for followup to see if there any other recommendations. He may need a capsule endoscopy. He will refrain from heavy lifting for 2 more weeks. I will see him back in 1 month unless there are problems

## 2013-07-24 ENCOUNTER — Encounter (INDEPENDENT_AMBULATORY_CARE_PROVIDER_SITE_OTHER): Payer: BC Managed Care – PPO | Admitting: Surgery

## 2013-08-27 ENCOUNTER — Encounter (INDEPENDENT_AMBULATORY_CARE_PROVIDER_SITE_OTHER): Payer: BC Managed Care – PPO | Admitting: Surgery

## 2014-04-10 ENCOUNTER — Encounter: Payer: Self-pay | Admitting: Family Medicine

## 2014-04-16 ENCOUNTER — Ambulatory Visit: Payer: BC Managed Care – PPO | Admitting: Family Medicine

## 2014-04-16 ENCOUNTER — Encounter: Payer: Self-pay | Admitting: Family Medicine

## 2014-04-16 VITALS — BP 118/75 | HR 88 | Temp 98.1°F | Ht 68.0 in | Wt 218.0 lb

## 2014-04-16 DIAGNOSIS — Z139 Encounter for screening, unspecified: Secondary | ICD-10-CM

## 2014-04-16 DIAGNOSIS — Z021 Encounter for pre-employment examination: Secondary | ICD-10-CM

## 2014-04-16 DIAGNOSIS — Z024 Encounter for examination for driving license: Secondary | ICD-10-CM

## 2014-04-16 LAB — POCT URINALYSIS DIPSTICK
Bilirubin, UA: NEGATIVE
Glucose, UA: NEGATIVE
Ketones, UA: NEGATIVE
Leukocytes, UA: NEGATIVE
Nitrite, UA: NEGATIVE
Spec Grav, UA: 1.02
Urobilinogen, UA: NEGATIVE
pH, UA: 6

## 2014-04-16 MED ORDER — TADALAFIL 20 MG PO TABS: 10.0000 mg | ORAL_TABLET | ORAL | Status: DC | PRN

## 2014-04-16 NOTE — Progress Notes (Signed)
   Subjective:    Patient ID: Jonathan Burke, male    DOB: 06/19/1960, 53 y.o.   MRN: [id number]  HPI Patient is here for DOT physical.  He denies any acute medical problems.  Review of Systems  Constitutional: Negative for fever.  HENT: Negative for ear pain.   Eyes: Negative for discharge.  Respiratory: Negative for cough.   Cardiovascular: Negative for chest pain.  Gastrointestinal: Negative for abdominal distention.  Endocrine: Negative for polyuria.  Genitourinary: Negative for difficulty urinating.  Musculoskeletal: Negative for gait problem and neck pain.  Skin: Negative for color change and rash.  Neurological: Negative for speech difficulty and headaches.  Psychiatric/Behavioral: Negative for agitation.       Objective:    BP 118/75 mmHg  Pulse 88  Temp(Src) 98.1 F (36.7 C) (Oral)  Ht 5\' 8"  (1.727 m)  Wt 218 lb (98.884 kg)  BMI 33.15 kg/m2 Physical Exam  Constitutional: He is oriented to person, place, and time. He appears well-developed and well-nourished.  HENT:  Head: Normocephalic and atraumatic.  Mouth/Throat: Oropharynx is clear and moist.  Eyes: Pupils are equal, round, and reactive to light.  Neck: Normal range of motion. Neck supple.  Cardiovascular: Normal rate and regular rhythm.   No murmur heard. Pulmonary/Chest: Effort normal and breath sounds normal.  Abdominal: Soft. Bowel sounds are normal. There is no tenderness.  Neurological: He is alert and oriented to person, place, and time.  Skin: Skin is warm and dry.  Psychiatric: He has a normal mood and affect.    Results for orders placed or performed in visit on 04/16/14  POCT urinalysis dipstick  Result Value Ref Range   Color, UA STRAW    Clarity, UA CLOUDY    Glucose, UA NEG    Bilirubin, UA NEG    Ketones, UA NEG    Spec Grav, UA 1.020    Blood, UA LARGE    pH, UA 6.0    Protein, UA 2++    Urobilinogen, UA negative    Nitrite, UA NEG    Leukocytes, UA Negative           Assessment & Plan:     ICD-9-CM ICD-10-CM   1. Screening V82.9 Z13.9 POCT urinalysis dipstick  2. 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  Deatra CanterWilliam J Rigley Niess FNP

## 2014-04-16 NOTE — Addendum Note (Signed)
Addended by: Deatra CanterXFORD, Yohana Bartha J on: 04/16/2014 09:46 AM   Modules accepted: Orders

## 2014-05-16 ENCOUNTER — Telehealth: Payer: Self-pay | Admitting: Family Medicine

## 2014-05-17 MED ORDER — TADALAFIL 20 MG PO TABS: 10.0000 mg | ORAL_TABLET | ORAL | Status: DC | PRN

## 2014-05-17 NOTE — Telephone Encounter (Signed)
confirmed with the drug store - he has not picked up  I will change the pharm and call pt

## 2014-05-27 ENCOUNTER — Other Ambulatory Visit: Payer: Self-pay | Admitting: Family Medicine

## 2014-05-27 ENCOUNTER — Telehealth: Payer: Self-pay | Admitting: *Deleted

## 2014-05-27 NOTE — Telephone Encounter (Signed)
Bill, [employer]'s ins denied the cialis until he has tried and failed viagra and then we can try cialis again and it should pay. Can you take care of this for me? Thanks.

## 2014-05-27 NOTE — Telephone Encounter (Signed)
Please call patient and explain his insurance will not pay for cialis but will viagra.  [facility]

## 2014-05-27 NOTE — Telephone Encounter (Signed)
Patient does not want a script of viagra.

## 2015-05-21 ENCOUNTER — Encounter (HOSPITAL_COMMUNITY): Payer: Self-pay | Admitting: Emergency Medicine

## 2015-05-21 ENCOUNTER — Inpatient Hospital Stay (HOSPITAL_COMMUNITY)
Admission: EM | Admit: 2015-05-21 | Discharge: 2015-05-24 | DRG: 390 | Disposition: A | Discharge status: Home / Self Care | Payer: BLUE CROSS/BLUE SHIELD | Attending: General Surgery | Admitting: General Surgery

## 2015-05-21 ENCOUNTER — Emergency Department (HOSPITAL_COMMUNITY): Payer: BLUE CROSS/BLUE SHIELD

## 2015-05-21 DIAGNOSIS — N400 Enlarged prostate without lower urinary tract symptoms: Secondary | ICD-10-CM | POA: Diagnosis present

## 2015-05-21 DIAGNOSIS — Z0189 Encounter for other specified special examinations: Secondary | ICD-10-CM

## 2015-05-21 DIAGNOSIS — K56600 Partial intestinal obstruction, unspecified as to cause: Secondary | ICD-10-CM | POA: Diagnosis present

## 2015-05-21 DIAGNOSIS — K56609 Unspecified intestinal obstruction, unspecified as to partial versus complete obstruction: Secondary | ICD-10-CM

## 2015-05-21 DIAGNOSIS — R109 Unspecified abdominal pain: Secondary | ICD-10-CM | POA: Diagnosis present

## 2015-05-21 DIAGNOSIS — Z79899 Other long term (current) drug therapy: Secondary | ICD-10-CM | POA: Diagnosis not present

## 2015-05-21 DIAGNOSIS — K219 Gastro-esophageal reflux disease without esophagitis: Secondary | ICD-10-CM | POA: Diagnosis present

## 2015-05-21 LAB — COMPREHENSIVE METABOLIC PANEL
ALT: 37 U/L (ref 17–63)
ANION GAP: 9 (ref 5–15)
AST: 27 U/L (ref 15–41)
Albumin: 4 g/dL (ref 3.5–5.0)
Alkaline Phosphatase: 52 U/L (ref 38–126)
BUN: 14 mg/dL (ref 6–20)
CO2: 25 mmol/L (ref 22–32)
Calcium: 9.2 mg/dL (ref 8.9–10.3)
Chloride: 106 mmol/L (ref 101–111)
Creatinine, Ser: 0.82 mg/dL (ref 0.61–1.24)
GFR calc Af Amer: >60 mL/min (ref >60)
GLUCOSE: 119 mg/dL — AB (ref 65–99)
POTASSIUM: 3.5 mmol/L (ref 3.5–5.1)
Sodium: 140 mmol/L (ref 135–145)
Total Bilirubin: 0.9 mg/dL (ref 0.3–1.2)
Total Protein: 7.5 g/dL (ref 6.5–8.1)

## 2015-05-21 LAB — URINALYSIS, ROUTINE W REFLEX MICROSCOPIC
Glucose, UA: NEGATIVE mg/dL
Ketones, ur: 40 mg/dL — AB
Leukocytes, UA: NEGATIVE
Nitrite: NEGATIVE
PH: 6 (ref 5.0–8.0)
Protein, ur: NEGATIVE mg/dL
Specific Gravity, Urine: 1.023 (ref 1.005–1.030)

## 2015-05-21 LAB — CBC
HEMATOCRIT: 46.4 % (ref 39.0–52.0)
Hemoglobin: 15.7 g/dL (ref 13.0–17.0)
MCH: 31 pg (ref 26.0–34.0)
MCHC: 33.8 g/dL (ref 30.0–36.0)
MCV: 91.7 fL (ref 78.0–100.0)
Platelets: 291 10*3/uL (ref 150–400)
RBC: 5.06 MIL/uL (ref 4.22–5.81)
RDW: 13 % (ref 11.5–15.5)
WBC: 7.4 10*3/uL (ref 4.0–10.5)

## 2015-05-21 LAB — I-STAT TROPONIN, ED: Troponin i, poc: 0 ng/mL (ref 0.00–0.08)

## 2015-05-21 LAB — URINE MICROSCOPIC-ADD ON: WBC UA: NONE SEEN WBC/hpf (ref 0–5)

## 2015-05-21 LAB — LACTIC ACID, PLASMA: LACTIC ACID, VENOUS: 0.9 mmol/L (ref 0.5–2.0)

## 2015-05-21 LAB — LIPASE, BLOOD: Lipase: 25 U/L (ref 11–51)

## 2015-05-21 MED ORDER — IOHEXOL 300 MG/ML  SOLN
100.0000 mL | Freq: Once | INTRAMUSCULAR | Status: AC | PRN
  Administered 2015-05-21: 100 mL via INTRAVENOUS

## 2015-05-21 MED ORDER — FENTANYL CITRATE (PF) 100 MCG/2ML IJ SOLN
100.0000 ug | Freq: Once | INTRAMUSCULAR | Status: AC
  Administered 2015-05-21: 100 ug via INTRAVENOUS
  Filled 2015-05-21: qty 2

## 2015-05-21 MED ORDER — ONDANSETRON HCL 4 MG/2ML IJ SOLN
4.0000 mg | Freq: Once | INTRAMUSCULAR | Status: AC
  Administered 2015-05-21: 4 mg via INTRAVENOUS
  Filled 2015-05-21: qty 2

## 2015-05-21 MED ORDER — ONDANSETRON HCL 4 MG/2ML IJ SOLN
4.0000 mg | Freq: Once | INTRAMUSCULAR | Status: AC | PRN
  Administered 2015-05-21: 4 mg via INTRAVENOUS
  Filled 2015-05-21: qty 2

## 2015-05-21 MED ORDER — PHENOL 1.4 % MT LIQD
1.0000 | OROMUCOSAL | Status: DC | PRN
  Administered 2015-05-21: 1 via OROMUCOSAL
  Filled 2015-05-21: qty 177

## 2015-05-21 NOTE — ED Notes (Signed)
[place] at the bedside.

## 2015-05-21 NOTE — ED Notes (Addendum)
The patient says he has had a bowel blockage before and he thinks this is what it is now.  He says he had a small amount of watery poop come out but a good, normal bowel movement was tuesday morning.  He rates his pain 5/10.  He did say he is having pressure and he did throw up food today.  The patient does have hernias that have caused problems in the past.

## 2015-05-21 NOTE — H&P (Signed)
Jonathan Burke is an 54 y.o. male.   Chief Complaint: worsening abd pain HPI: 54 yo wm with a complex abdominal history comes to the ED with worsening abdominal pain,n/v. CT showed dilated SB with pSBO and surgery was called. His last good BM was Monday. He had a little watery stool and flatus yesterday. He has had several episodes of emesis today.  Symptoms started circa February 2014, when he was hospitalized with SBO, managed conservatively. Subsequently, he had persistent abdominal discomfort and distention and due to concern for possible low grade bowel obstruction, he underwent exploratory laparoscopy by Dr Ninfa Linden in December 2014 (about 1 year ago) which showed only minimal SB adhesions, but no obstruction. This was converted to an open procedure and adhesions were lysed. Following surgery, patient had a prolonged ileus requiring NGT drainage, and had a fairly prolonged recovery after that. He underwent upper endoscopy in January 2015 which did not show any obstruction or other abnormalities (see below).   Has been evaluated at [facility] earlier in year. Has several upper midline fat containing incisional hernias. Reports ongoing issues in abd - has repeated bouts of abd distension with belching and some pain at times. This episode was different as pain worsened and had changes in stools   Past Medical History  Diagnosis Date  . Headache(784.0)     tension  . Dental crowns present     also caps  . 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 GERD (gastroesophageal reflux disease)     daily OTC  . Complication of anesthesia     states is hard to wake up  . Small bowel obstruction ([place]) 07/17/2012  . PONV (postoperative nausea and vomiting)   . Blood in urine   . Enlarged prostate   . H/O hiatal hernia     pt unsure if hernia or not, but has a knot in his stomach    Past Surgical History  Procedure Laterality Date  . Direct laryngoscopy  07/07/2001    suspension microdirect  laryngoscopy with exc. left vocal cord mass  . Lumbar laminectomy/decompression microdiscectomy  10/29/1999    L5-S1  . Shoulder surgery      left  . Carpal tunnel release  09/09/2011    Procedure: CARPAL TUNNEL RELEASE;  Surgeon: Cammie Sickle., MD;  Location: [place];  Service: Orthopedics;  Laterality: Left;  . Cholecystectomy    . Appendectomy    . Laparoscopic lysis of adhesions N/A 05/29/2013    Procedure: DIAGNOSTIC LAPAROSCOPY CONVERTED TO EXPLORATORY LAPAROTOMY, LYSIS OF ADHESIONS ;  Surgeon: Harl Bowie, MD;  Location: [place];  Service: General;  Laterality: N/A;  . Esophagogastroduodenoscopy N/A 06/05/2013    Procedure: ESOPHAGOGASTRODUODENOSCOPY (EGD);  Surgeon: Winfield Cunas., MD;  Location: [facility] ENDOSCOPY;  Service: Endoscopy;  Laterality: N/A;    Family History  Problem Relation Age of Onset  . Heart attack Father   . Arthritis Sister   . Hypertension Brother   . Hypertension Sister    Social History:  reports that he has never smoked. He has never used smokeless tobacco. He reports that he does not drink alcohol or use illicit drugs.  Allergies:  Allergies  Allergen Reactions  . Adhesive [Tape] Other (See Comments)    PULLS SKIN OFF. Paper tape only     (Not in a hospital admission)  Results for orders placed or performed during the hospital encounter of 05/21/15 (from the past 48 hour(s))  Lipase, blood  Status: None   Collection Time: 05/21/15  8:34 PM  Result Value Ref Range   Lipase 25 11 - 51 U/L  Comprehensive metabolic panel     Status: Abnormal   Collection Time: 05/21/15  8:34 PM  Result Value Ref Range   Sodium 140 135 - 145 mmol/L   Potassium 3.5 3.5 - 5.1 mmol/L   Chloride 106 101 - 111 mmol/L   CO2 25 22 - 32 mmol/L   Glucose, Bld 119 (H) 65 - 99 mg/dL   BUN 14 6 - 20 mg/dL   Creatinine, Ser 0.82 0.61 - 1.24 mg/dL   Calcium 9.2 8.9 - 10.3 mg/dL   Total Protein 7.5 6.5 - 8.1 g/dL   Albumin 4.0 3.5 - 5.0 g/dL    AST 27 15 - 41 U/L   ALT 37 17 - 63 U/L   Alkaline Phosphatase 52 38 - 126 U/L   Total Bilirubin 0.9 0.3 - 1.2 mg/dL   GFR calc non Af Amer >60 >60 mL/min   GFR calc Af Amer >60 >60 mL/min    Comment: (NOTE) The eGFR has been calculated using the CKD EPI equation. This calculation has not been validated in all clinical situations. eGFR's persistently <60 mL/min signify possible Chronic Kidney Disease.    Anion gap 9 5 - 15  CBC     Status: None   Collection Time: 05/21/15  8:34 PM  Result Value Ref Range   WBC 7.4 4.0 - 10.5 K/uL   RBC 5.06 4.22 - 5.81 MIL/uL   Hemoglobin 15.7 13.0 - 17.0 g/dL   HCT 46.4 39.0 - 52.0 %   MCV 91.7 78.0 - 100.0 fL   MCH 31.0 26.0 - 34.0 pg   MCHC 33.8 30.0 - 36.0 g/dL   RDW 13.0 11.5 - 15.5 %   Platelets 291 150 - 400 K/uL  I-Stat Troponin, ED (not at [facility])     Status: None   Collection Time: 05/21/15  8:43 PM  Result Value Ref Range   Troponin i, poc 0.00 0.00 - 0.08 ng/mL   Comment 3            Comment: Due to the release kinetics of cTnI, a negative result within the first hours of the onset of symptoms does not rule out myocardial infarction with certainty. If myocardial infarction is still suspected, repeat the test at appropriate intervals.   Urinalysis, Routine w reflex microscopic (not at [facility])     Status: Abnormal   Collection Time: 05/21/15  8:46 PM  Result Value Ref Range   Color, Urine YELLOW YELLOW   APPearance CLEAR CLEAR   Specific Gravity, Urine 1.023 1.005 - 1.030   pH 6.0 5.0 - 8.0   Glucose, UA NEGATIVE NEGATIVE mg/dL   Hgb urine dipstick SMALL (A) NEGATIVE   Bilirubin Urine SMALL (A) NEGATIVE   Ketones, ur 40 (A) NEGATIVE mg/dL   Protein, ur NEGATIVE NEGATIVE mg/dL   Nitrite NEGATIVE NEGATIVE   Leukocytes, UA NEGATIVE NEGATIVE  Urine microscopic-add on     Status: Abnormal   Collection Time: 05/21/15  8:46 PM  Result Value Ref Range   Squamous Epithelial / LPF 0-5 (A) NONE SEEN   WBC, UA NONE SEEN 0 - 5  WBC/hpf   RBC / HPF 0-5 0 - 5 RBC/hpf   Bacteria, UA RARE (A) NONE SEEN   Urine-Other MUCOUS PRESENT   Lactic acid, plasma     Status: None   Collection Time: 05/21/15  8:50  PM  Result Value Ref Range   Lactic Acid, Venous 0.9 0.5 - 2.0 mmol/L   Ct Abdomen Pelvis W Contrast  05/21/2015  CLINICAL DATA:  Upper abdominal pain with nausea, vomiting and dizziness for 2 days EXAM: CT ABDOMEN AND PELVIS WITH CONTRAST TECHNIQUE: Multidetector CT imaging of the abdomen and pelvis was performed using the standard protocol following bolus administration of intravenous contrast. CONTRAST:  151m OMNIPAQUE IOHEXOL 300 MG/ML  SOLN COMPARISON:  07/17/2012 FINDINGS: Lower chest:  Clear lung bases.  Normal heart size. Hepatobiliary: Low attenuation of the liver as can be seen with hepatic steatosis. No focal hepatic mass. Prior cholecystectomy. Pancreas: Normal. Spleen: Normal. Adrenals/Urinary Tract: Normal adrenal glands. Normal kidneys. No urolithiasis or obstructive uropathy. Normal bladder. Stomach/Bowel: Small bowel dilatation measuring up to 3.1 cm with multiple air-fluid levels. There is a relative transition point in the left mid abdomen. No pneumatosis, pneumoperitoneum or portal venous gas. No abdominal or pelvic free fluid. Small fat containing supraumbilical hernia. Vascular/Lymphatic: Normal caliber abdominal aorta. No lymphadenopathy. Other: No fluid collection or hematoma. Musculoskeletal: No acute osseous abnormality. No lytic or sclerotic osseous lesion. Degenerative disc disease and facet arthropathy at L4-5 and L5-S1. IMPRESSION: 1. Findings most compatible with a small bowel obstruction with a relative transition point in the left mid abdomen. 2. Hepatic steatosis. Electronically Signed   By: HKathreen Devoid  On: 05/21/2015 22:49   Dg Abd Acute W/chest  05/21/2015  CLINICAL DATA:  Initial evaluation for acute abdominal pain for 1 day, vomiting. EXAM: DG ABDOMEN ACUTE W/ 1V CHEST COMPARISON:  Prior  study from 06/04/2013. FINDINGS: Cardiac and mediastinal silhouettes are within normal limits. Lungs are mildly hypoinflated. Mild bibasilar subsegmental atelectasis. No consolidative airspace disease. No pulmonary edema or pleural effusion. No pneumothorax. Multiple mildly prominent gas-filled loops of small bowel seen scattered throughout the abdomen measuring up to 4.7 cm in diameter. Few scattered air-fluid levels on upright projection. There are some normal caliber loops of gas-filled bowel localized within the right lower quadrant. Findings suspicious for possible underlying obstructive process. No soft tissue mass or abnormal calcification. Cholecystectomy clips noted. Scoliosis with mild multilevel degenerative changes noted within the visualized spine. No acute osseous abnormality. IMPRESSION: 1. Multiple gas-filled loops of small bowel measuring up to 4.7 cm in diameter with few scattered air-fluid levels. Findings concerning for possible underlying obstructive process. This could be further assessed with dedicated cross-sectional imaging as indicated. 2. Minimal bibasilar atelectasis. No other active cardiopulmonary disease Electronically Signed   By: BJeannine BogaM.D.   On: 05/21/2015 21:07    Review of Systems  Constitutional: Negative for weight loss.  HENT: Negative for nosebleeds.   Eyes: Negative for blurred vision.  Respiratory: Negative for shortness of breath.   Cardiovascular: Negative for chest pain, palpitations, orthopnea and PND.       Denies DOE  Gastrointestinal: Positive for nausea, vomiting and abdominal pain.  Genitourinary: Negative for dysuria and hematuria.  Musculoskeletal: Negative.   Skin: Negative for itching and rash.  Neurological: Negative for dizziness, focal weakness, seizures, loss of consciousness and headaches.       Denies TIAs, amaurosis fugax  Endo/Heme/Allergies: Does not bruise/bleed easily.  Psychiatric/Behavioral: The patient is not  nervous/anxious.     Blood pressure 122/83, pulse 99, temperature 97.4 F (36.3 C), temperature source Oral, resp. rate 12, height 5' 8"  (1.727 m), weight 104.101 kg (229 lb 8 oz), SpO2 98 %. Physical Exam  Vitals reviewed. Constitutional: He is oriented to person,  place, and time. He appears well-developed and well-nourished. No distress.  Appears a little uncomfortable - getting NG placed but non toxic  HENT:  Head: Normocephalic and atraumatic.  Right Ear: External ear normal.  Left Ear: External ear normal.  Eyes: Conjunctivae are normal. No scleral icterus.  Neck: Normal range of motion. Neck supple. No tracheal deviation present. No thyromegaly present.  Cardiovascular: Normal rate and normal heart sounds.   Respiratory: Effort normal and breath sounds normal. No stridor. No respiratory distress. He has no wheezes.  GI: Soft. He exhibits distension. There is no rebound and no guarding.    Well healed old mini midline incision. Palpable fascial defects in upper midline. Soft, reducible. Mostly nontender. No rebound/guarding. Some very MILD TTP in l abd.   Musculoskeletal: He exhibits no edema or tenderness.  Lymphadenopathy:    He has no cervical adenopathy.  Neurological: He is alert and oriented to person, place, and time. He exhibits normal muscle tone.  Skin: Skin is warm and dry. No rash noted. He is not diaphoretic. No erythema. No pallor.  Psychiatric: He has a normal mood and affect. His behavior is normal. Judgment and thought content normal.     Assessment/Plan psbo Incisional hernia (fat containing) Chronic abdominal bloating  Nontoxic. No fever. No wbc. Mostly nontender.  Admit bowel rest Ng tube to liws Small bowel protocol.   Leighton Ruff. Redmond Pulling, MD, FACS General, Bariatric, & Minimally Invasive Surgery [facility], [place]   [facility] M 05/21/2015, 11:31 PM

## 2015-05-21 NOTE — ED Provider Notes (Signed)
CSN: [id number]     Arrival date & time 05/21/15  2005 History   First MD Initiated Contact with Patient 05/21/15 2029     Chief Complaint  Patient presents with  . Constipation    The patient says he has had a bowel blockage before and he thinks this is what it is now.  He says he had a small amount of watery poop come out but a good, normal bowel movement was tuesday morning.     HPI The patient says he has had a bowel blockage before and he thinks this is what it is now. He says he had a small amount of watery poop come out but a good, normal bowel movement was tuesday morning. He rates his pain 5/10. He did say he is having pressure and he did throw up food today. The patient does have hernias that have caused problems in the past. Past Medical History  Diagnosis Date  . Headache(784.0)     tension  . Dental crowns present     also caps  . 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 GERD (gastroesophageal reflux disease)     daily OTC  . Complication of anesthesia     states is hard to wake up  . Small bowel obstruction ([facility]) 07/17/2012  . PONV (postoperative nausea and vomiting)   . Blood in urine   . Enlarged prostate   . H/O hiatal hernia     pt unsure if hernia or not, but has a knot in his stomach   Past Surgical History  Procedure Laterality Date  . 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 Lumbar laminectomy/decompression microdiscectomy  10/29/1999    L5-S1  . Shoulder surgery      left  . Carpal tunnel release  09/09/2011    Procedure: CARPAL TUNNEL RELEASE;  Surgeon: Wyn Forster., MD;  Location: [facility];  Service: Orthopedics;  Laterality: Left;  . Cholecystectomy    . Appendectomy    . Laparoscopic lysis of adhesions N/A 05/29/2013    Procedure: DIAGNOSTIC LAPAROSCOPY CONVERTED TO EXPLORATORY LAPAROTOMY, LYSIS OF ADHESIONS ;  Surgeon: Shelly Rubenstein, MD;  Location:  [facility] OR;  Service: General;  Laterality: N/A;  . Esophagogastroduodenoscopy N/A 06/05/2013    Procedure: ESOPHAGOGASTRODUODENOSCOPY (EGD);  Surgeon: Vertell Novak., MD;  Location: [facility] ENDOSCOPY;  Service: Endoscopy;  Laterality: N/A;   Family History  Problem Relation Age of Onset  . Heart attack Father   . Arthritis Sister   . Hypertension Brother   . Hypertension Sister    Social History  Substance Use Topics  . Smoking status: Never Smoker   . Smokeless tobacco: Never Used  . Alcohol Use: No    Review of Systems  All other systems reviewed and are negative  Allergies  Adhesive  Home Medications   Prior to Admission medications   Medication Sig Start Date End Date Taking? Authorizing Provider  Celecoxib (CELEBREX PO) Take 1 tablet by mouth daily.   Yes Historical Provider, MD  Cholecalciferol (VITAMIN D) 2000 UNITS CAPS Take 4,000 Units by mouth daily.   Yes Historical Provider, MD  pantoprazole (PROTONIX) 40 MG tablet Take 1 tablet (40 mg total) by mouth daily. 06/05/13  Yes Abigail Miyamoto, MD  metoCLOPramide (REGLAN) 10 MG tablet Take 1 tablet (10 mg total) by mouth 3 (three) times daily before meals. Patient not taking: Reported on  05/21/2015 06/05/13   Abigail Miyamotoouglas Blackman, MD  ondansetron (ZOFRAN) 4 MG tablet Take 1 tablet (4 mg total) by mouth every 8 (eight) hours as needed for nausea or vomiting. Patient not taking: Reported on 05/21/2015 06/08/13   Abigail Miyamotoouglas Blackman, MD  tadalafil (CIALIS) 20 MG tablet Take 0.5-1 tablets (10-20 mg total) by mouth every other day as needed for erectile dysfunction. 05/17/14   Deatra CanterWilliam J Oxford, FNP   BP 128/83 mmHg  Pulse 81  Temp(Src) 97.4 F (36.3 C) (Oral)  Resp 13  Ht 5\' 8"  (1.727 m)  Wt 229 lb 8 oz (104.101 kg)  BMI 34.90 kg/m2  SpO2 97% Physical Exam Physical Exam  Nursing note and vitals reviewed. Constitutional: He is oriented to person, place, and time. He appears well-developed and well-nourished. No distress.  HENT:   Head: Normocephalic and atraumatic.  Eyes: Pupils are equal, round, and reactive to light.  Neck: Normal range of motion.  Cardiovascular: Normal rate and intact distal pulses.   Pulmonary/Chest: No respiratory distress.  Abdominal: Normal appearance.  The abdomen appears distended with generalized nonfocal tenderness.  Bowel sounds are decreased to absent.   Musculoskeletal: Normal range of motion.  Neurological: He is alert and oriented to person, place, and time. No cranial nerve deficit.  Skin: Skin is warm and dry. No rash noted.  Psychiatric: He has a normal mood and affect. His behavior is normal.   ED Course  Procedures (including critical care time) Labs Review Labs Reviewed  COMPREHENSIVE METABOLIC PANEL - Abnormal; Notable for the following:    Glucose, Bld 119 (*)    All other components within normal limits  URINALYSIS, ROUTINE W REFLEX MICROSCOPIC ([facility]) - Abnormal; Notable for the following:    Hgb urine dipstick SMALL (*)    Bilirubin Urine SMALL (*)    Ketones, ur 40 (*)    All other components within normal limits  URINE MICROSCOPIC-ADD ON - Abnormal; Notable for the following:    Squamous Epithelial / LPF 0-5 (*)    Bacteria, UA RARE (*)    All other components within normal limits  LIPASE, BLOOD  CBC  LACTIC ACID, PLASMA  I-STAT TROPOININ, ED    Imaging Review Ct Abdomen Pelvis W Contrast  05/21/2015  CLINICAL DATA:  Upper abdominal pain with nausea, vomiting and dizziness for 2 days EXAM: CT ABDOMEN AND PELVIS WITH CONTRAST TECHNIQUE: Multidetector CT imaging of the abdomen and pelvis was performed using the standard protocol following bolus administration of intravenous contrast. CONTRAST:  100mL OMNIPAQUE IOHEXOL 300 MG/ML  SOLN COMPARISON:  07/17/2012 FINDINGS: Lower chest:  Clear lung bases.  Normal heart size. Hepatobiliary: Low attenuation of the liver as can be seen with hepatic steatosis. No focal hepatic mass. Prior cholecystectomy. Pancreas:  Normal. Spleen: Normal. Adrenals/Urinary Tract: Normal adrenal glands. Normal kidneys. No urolithiasis or obstructive uropathy. Normal bladder. Stomach/Bowel: Small bowel dilatation measuring up to 3.1 cm with multiple air-fluid levels. There is a relative transition point in the left mid abdomen. No pneumatosis, pneumoperitoneum or portal venous gas. No abdominal or pelvic free fluid. Small fat containing supraumbilical hernia. Vascular/Lymphatic: Normal caliber abdominal aorta. No lymphadenopathy. Other: No fluid collection or hematoma. Musculoskeletal: No acute osseous abnormality. No lytic or sclerotic osseous lesion. Degenerative disc disease and facet arthropathy at L4-5 and L5-S1. IMPRESSION: 1. Findings most compatible with a small bowel obstruction with a relative transition point in the left mid abdomen. 2. Hepatic steatosis. Electronically Signed   By: Elige KoHetal  Patel  On: 05/21/2015 22:49   Dg Abd Acute W/chest  05/21/2015  CLINICAL DATA:  Initial evaluation for acute abdominal pain for 1 day, vomiting. EXAM: DG ABDOMEN ACUTE W/ 1V CHEST COMPARISON:  Prior study from 06/04/2013. FINDINGS: Cardiac and mediastinal silhouettes are within normal limits. Lungs are mildly hypoinflated. Mild bibasilar subsegmental atelectasis. No consolidative airspace disease. No pulmonary edema or pleural effusion. No pneumothorax. Multiple mildly prominent gas-filled loops of small bowel seen scattered throughout the abdomen measuring up to 4.7 cm in diameter. Few scattered air-fluid levels on upright projection. There are some normal caliber loops of gas-filled bowel localized within the right lower quadrant. Findings suspicious for possible underlying obstructive process. No soft tissue mass or abnormal calcification. Cholecystectomy clips noted. Scoliosis with mild multilevel degenerative changes noted within the visualized spine. No acute osseous abnormality. IMPRESSION: 1. Multiple gas-filled loops of small bowel  measuring up to 4.7 cm in diameter with few scattered air-fluid levels. Findings concerning for possible underlying obstructive process. This could be further assessed with dedicated cross-sectional imaging as indicated. 2. Minimal bibasilar atelectasis. No other active cardiopulmonary disease Electronically Signed   By: Rise Mu M.D.   On: 05/21/2015 21:07   I have personally reviewed and evaluated these images and lab results as part of my medical decision-making.   EKG Interpretation   Date/Time:  Wednesday May 21 2015 20:19:59 EST Ventricular Rate:  77 PR Interval:  148 QRS Duration: 76 QT Interval:  382 QTC Calculation: 432 R Axis:   25 Text Interpretation:  Normal sinus rhythm Nonspecific T wave abnormality  Abnormal ECG Confirmed by Linus Weckerly  MD, Mintie Witherington ([id number]) on 05/21/2015 8:31:23  PM     I discussed the case with general surgery who requested an NG tube be placed and they will be down for consult. MDM   Final diagnoses:  Intestinal obstruction, unspecified type ([facility])        Nelva Nay, MD 05/21/15 2311

## 2015-05-22 ENCOUNTER — Inpatient Hospital Stay (HOSPITAL_COMMUNITY): Payer: BLUE CROSS/BLUE SHIELD

## 2015-05-22 LAB — CBC
HEMATOCRIT: 46.2 % (ref 39.0–52.0)
HEMOGLOBIN: 15.5 g/dL (ref 13.0–17.0)
MCH: 31.1 pg (ref 26.0–34.0)
MCHC: 33.5 g/dL (ref 30.0–36.0)
MCV: 92.6 fL (ref 78.0–100.0)
Platelets: 299 10*3/uL (ref 150–400)
RBC: 4.99 MIL/uL (ref 4.22–5.81)
RDW: 13.2 % (ref 11.5–15.5)
WBC: 7.7 10*3/uL (ref 4.0–10.5)

## 2015-05-22 LAB — BASIC METABOLIC PANEL
ANION GAP: 9 (ref 5–15)
BUN: 14 mg/dL (ref 6–20)
CALCIUM: 8.9 mg/dL (ref 8.9–10.3)
CO2: 27 mmol/L (ref 22–32)
Chloride: 101 mmol/L (ref 101–111)
Creatinine, Ser: 0.84 mg/dL (ref 0.61–1.24)
GFR calc Af Amer: >60 mL/min (ref >60)
GFR calc non Af Amer: >60 mL/min (ref >60)
GLUCOSE: 143 mg/dL — AB (ref 65–99)
Potassium: 3.6 mmol/L (ref 3.5–5.1)
Sodium: 137 mmol/L (ref 135–145)

## 2015-05-22 MED ORDER — MORPHINE SULFATE (PF) 2 MG/ML IV SOLN: 1.0000 mg | INTRAVENOUS | Status: DC | PRN

## 2015-05-22 MED ORDER — PROMETHAZINE HCL 25 MG/ML IJ SOLN
12.5000 mg | Freq: Four times a day (QID) | INTRAMUSCULAR | Status: DC | PRN
  Administered 2015-05-22 – 2015-05-23 (×2): 12.5 mg via INTRAVENOUS
  Filled 2015-05-22 (×2): qty 1

## 2015-05-22 MED ORDER — INFLUENZA VAC SPLIT QUAD 0.5 ML IM SUSY
0.5000 mL | PREFILLED_SYRINGE | INTRAMUSCULAR | Status: AC
  Administered 2015-05-23: 0.5 mL via INTRAMUSCULAR
  Filled 2015-05-22: qty 0.5

## 2015-05-22 MED ORDER — ACETAMINOPHEN 650 MG RE SUPP
650.0000 mg | Freq: Four times a day (QID) | RECTAL | Status: DC | PRN
  Filled 2015-05-22: qty 1

## 2015-05-22 MED ORDER — CETYLPYRIDINIUM CHLORIDE 0.05 % MT LIQD
7.0000 mL | Freq: Two times a day (BID) | OROMUCOSAL | Status: DC
  Administered 2015-05-22 – 2015-05-23 (×4): 7 mL via OROMUCOSAL

## 2015-05-22 MED ORDER — FENTANYL CITRATE (PF) 100 MCG/2ML IJ SOLN
12.5000 ug | INTRAMUSCULAR | Status: DC | PRN
  Administered 2015-05-22 – 2015-05-23 (×7): 25 ug via INTRAVENOUS
  Filled 2015-05-22 (×9): qty 2

## 2015-05-22 MED ORDER — DIPHENHYDRAMINE HCL 12.5 MG/5ML PO ELIX: 12.5000 mg | ORAL_SOLUTION | Freq: Four times a day (QID) | ORAL | Status: DC | PRN

## 2015-05-22 MED ORDER — ONDANSETRON 4 MG PO TBDP: 4.0000 mg | ORAL_TABLET | Freq: Four times a day (QID) | ORAL | Status: DC | PRN

## 2015-05-22 MED ORDER — KCL IN DEXTROSE-NACL 20-5-0.45 MEQ/L-%-% IV SOLN
INTRAVENOUS | Status: DC
  Administered 2015-05-22 (×3): via INTRAVENOUS
  Administered 2015-05-23: 1 mL via INTRAVENOUS
  Filled 2015-05-22 (×6): qty 1000

## 2015-05-22 MED ORDER — ENOXAPARIN SODIUM 40 MG/0.4ML ~~LOC~~ SOLN
40.0000 mg | SUBCUTANEOUS | Status: DC
  Administered 2015-05-22 – 2015-05-24 (×3): 40 mg via SUBCUTANEOUS
  Filled 2015-05-22 (×3): qty 0.4

## 2015-05-22 MED ORDER — CHLORHEXIDINE GLUCONATE 0.12 % MT SOLN
15.0000 mL | Freq: Two times a day (BID) | OROMUCOSAL | Status: DC
  Administered 2015-05-22 – 2015-05-24 (×5): 15 mL via OROMUCOSAL
  Filled 2015-05-22 (×4): qty 15

## 2015-05-22 MED ORDER — DIPHENHYDRAMINE HCL 50 MG/ML IJ SOLN: 12.5000 mg | Freq: Four times a day (QID) | INTRAMUSCULAR | Status: DC | PRN

## 2015-05-22 MED ORDER — PANTOPRAZOLE SODIUM 40 MG IV SOLR
40.0000 mg | Freq: Every day | INTRAVENOUS | Status: DC
  Administered 2015-05-22 – 2015-05-23 (×3): 40 mg via INTRAVENOUS
  Filled 2015-05-22 (×3): qty 40

## 2015-05-22 MED ORDER — ONDANSETRON HCL 4 MG/2ML IJ SOLN
4.0000 mg | Freq: Four times a day (QID) | INTRAMUSCULAR | Status: DC | PRN
  Administered 2015-05-22: 4 mg via INTRAVENOUS
  Filled 2015-05-22: qty 2

## 2015-05-22 MED ORDER — DIATRIZOATE MEGLUMINE & SODIUM 66-10 % PO SOLN
90.0000 mL | Freq: Once | ORAL | Status: AC
  Administered 2015-05-22: 90 mL via NASOGASTRIC

## 2015-05-22 MED ORDER — DIATRIZOATE MEGLUMINE & SODIUM 66-10 % PO SOLN
ORAL | Status: AC
Start: 2015-05-22 — End: 2015-05-22
  Filled 2015-05-22: qty 90

## 2015-05-22 NOTE — Progress Notes (Signed)
Larger tube is working better.  Now clamped for the contrast study.  Hopefully he will be able to tolerate clamping and subsequent X-rays.  Marta LamasJames O. Gae BonWyatt, III, MD, FACS [phone]--pager [phone]--office [facility]

## 2015-05-22 NOTE — Progress Notes (Signed)
[facility] Progress Note     Subjective: Pt feels bad,  Still has N/V around NG, NG has only put out since placement and its thick dark particulate matter.  Ambulating okay.  NG only started working when he stood up.  NG only 14G.    Objective: Vital signs in last 24 hours: Temp:  [97.4 F (36.3 C)-97.9 F (36.6 C)] 97.9 F (36.6 C) (12/22 0530) Pulse Rate:  [70-99] 72 (12/22 0530) Resp:  [12-23] 15 (12/22 0530) BP: (114-148)/(80-108) 131/80 mmHg (12/22 0530) SpO2:  [94 %-98 %] 94 % (12/22 0530) Weight:  [103.2 kg (227 lb 8.2 oz)-104.101 kg (229 lb 8 oz)] 103.2 kg (227 lb 8.2 oz) (12/22 0045) Last BM Date: 05/20/15  Intake/Output from previous day: 12/21 0701 - 12/22 0700 In: 727.1 [P.O.:120; I.V.:577.1; NG/GT:30] Out: 200 [Emesis/NG output:200] Intake/Output this shift:    PE: Gen:  Alert, NAD, pleasant Abd: Soft, obese, distended, tender throughout, diminished BS, no HSM, NG with only out since placement   Lab Results:   Recent Labs  05/21/15 2034 05/22/15 0531  WBC 7.4 7.7  HGB 15.7 15.5  HCT 46.4 46.2  PLT 291 299   BMET  Recent Labs  05/21/15 2034 05/22/15 0531  NA 140 137  K 3.5 3.6  CL 106 101  CO2 25 27  GLUCOSE 119* 143*  BUN 14 14  CREATININE 0.82 0.84  CALCIUM 9.2 8.9   PT/INR No results for input(s): LABPROT, INR in the last 72 hours. CMP     Component Value Date/Time   NA 137 05/22/2015 0531   K 3.6 05/22/2015 0531   CL 101 05/22/2015 0531   CO2 27 05/22/2015 0531   GLUCOSE 143* 05/22/2015 0531   BUN 14 05/22/2015 0531   CREATININE 0.84 05/22/2015 0531   CALCIUM 8.9 05/22/2015 0531   PROT 7.5 05/21/2015 2034   ALBUMIN 4.0 05/21/2015 2034   AST 27 05/21/2015 2034   ALT 37 05/21/2015 2034   ALKPHOS 52 05/21/2015 2034   BILITOT 0.9 05/21/2015 2034   GFRNONAA >60 05/22/2015 0531   GFRAA >60 05/22/2015 0531   Lipase     Component Value Date/Time   LIPASE 25 05/21/2015 2034        Studies/Results: Ct Abdomen Pelvis W Contrast  05/21/2015  CLINICAL DATA:  Upper abdominal pain with nausea, vomiting and dizziness for 2 days EXAM: CT ABDOMEN AND PELVIS WITH CONTRAST TECHNIQUE: Multidetector CT imaging of the abdomen and pelvis was performed using the standard protocol following bolus administration of intravenous contrast. CONTRAST:  OMNIPAQUE IOHEXOL 300 MG/ML  SOLN COMPARISON:  07/17/2012 FINDINGS: Lower chest:  Clear lung bases.  Normal heart size. Hepatobiliary: Low attenuation of the liver as can be seen with hepatic steatosis. No focal hepatic mass. Prior cholecystectomy. Pancreas: Normal. Spleen: Normal. Adrenals/Urinary Tract: Normal adrenal glands. Normal kidneys. No urolithiasis or obstructive uropathy. Normal bladder. Stomach/Bowel: Small bowel dilatation measuring up to 3.1 cm with multiple air-fluid levels. There is a relative transition point in the left mid abdomen. No pneumatosis, pneumoperitoneum or portal venous gas. No abdominal or pelvic free fluid. Small fat containing supraumbilical hernia. Vascular/Lymphatic: Normal caliber abdominal aorta. No lymphadenopathy. Other: No fluid collection or hematoma. Musculoskeletal: No acute osseous abnormality. No lytic or sclerotic osseous lesion. Degenerative disc disease and facet arthropathy at L4-5 and L5-S1. IMPRESSION: 1. Findings most compatible with a small bowel obstruction with a relative transition point in the left mid abdomen. 2. Hepatic steatosis. Electronically Signed  By: Elige KoHetal  Patel   On: 05/21/2015 22:49   Dg Abd Acute W/chest  05/21/2015  CLINICAL DATA:  Initial evaluation for acute abdominal pain for 1 day, vomiting. EXAM: DG ABDOMEN ACUTE W/ 1V CHEST COMPARISON:  Prior study from 06/04/2013. FINDINGS: Cardiac and mediastinal silhouettes are within normal limits. Lungs are mildly hypoinflated. Mild bibasilar subsegmental atelectasis. No consolidative airspace disease. No pulmonary edema or  pleural effusion. No pneumothorax. Multiple mildly prominent gas-filled loops of small bowel seen scattered throughout the abdomen measuring up to 4.7 cm in diameter. Few scattered air-fluid levels on upright projection. There are some normal caliber loops of gas-filled bowel localized within the right lower quadrant. Findings suspicious for possible underlying obstructive process. No soft tissue mass or abnormal calcification. Cholecystectomy clips noted. Scoliosis with mild multilevel degenerative changes noted within the visualized spine. No acute osseous abnormality. IMPRESSION: 1. Multiple gas-filled loops of small bowel measuring up to 4.7 cm in diameter with few scattered air-fluid levels. Findings concerning for possible underlying obstructive process. This could be further assessed with dedicated cross-sectional imaging as indicated. 2. Minimal bibasilar atelectasis. No other active cardiopulmonary disease Electronically Signed   By: Rise MuBenjamin  McClintock M.D.   On: 05/21/2015 21:07    Anti-infectives: Anti-infectives    None       Assessment/Plan pSBO Incisional hernia (fat containing) Chronic abdominal bloating -No fever. No wbc. More tender and distended, feels very uncomfortable.   -Thick brown cloudy particulate drainage.  Has vomited some around NG tube.  Still distended.  Has a 14G in will need to upsize so we can adequately drain stomach -Bowel rest, IVF, pain control, antiemetics -Ng tube to LIWS -Small bowel protocol ordered once NG working properly -Ambulate and IS  H/o cholecystectomy and appendectomy    LOS: 1 day    Nonie HoyerMegan N Hershal Eriksson 05/22/2015, 7:44 AM Pager: [phone]

## 2015-05-23 LAB — CBC
HEMATOCRIT: 44.5 % (ref 39.0–52.0)
HEMOGLOBIN: 14.5 g/dL (ref 13.0–17.0)
MCH: 30.7 pg (ref 26.0–34.0)
MCHC: 32.6 g/dL (ref 30.0–36.0)
MCV: 94.1 fL (ref 78.0–100.0)
Platelets: 281 10*3/uL (ref 150–400)
RBC: 4.73 MIL/uL (ref 4.22–5.81)
RDW: 13 % (ref 11.5–15.5)
WBC: 8.5 10*3/uL (ref 4.0–10.5)

## 2015-05-23 LAB — BASIC METABOLIC PANEL
Anion gap: 6 (ref 5–15)
BUN: 12 mg/dL (ref 6–20)
CHLORIDE: 106 mmol/L (ref 101–111)
CO2: 28 mmol/L (ref 22–32)
CREATININE: 0.9 mg/dL (ref 0.61–1.24)
Calcium: 8.6 mg/dL — ABNORMAL LOW (ref 8.9–10.3)
GFR calc non Af Amer: >60 mL/min (ref >60)
GLUCOSE: 120 mg/dL — AB (ref 65–99)
Potassium: 3.8 mmol/L (ref 3.5–5.1)
Sodium: 140 mmol/L (ref 135–145)

## 2015-05-23 NOTE — Progress Notes (Signed)
[facility] Progress Note     Subjective: He'd doing much better.  Says bloating is improved.  Still intermittent pain.  No N/V.  He has had 3 BM's.  Ambulating OOB.  Not 100% yet.  Xray shows dilated loops of SB, but contrast in colon.  Objective: Vital signs in last 24 hours: Temp:  [97.9 F (36.6 C)-98.6 F (37 C)] 98.6 F (37 C) (12/23 0640) Pulse Rate:  [69-80] 73 (12/23 0640) Resp:  [18-20] 18 (12/23 0640) BP: (109-129)/(74-87) 129/87 mmHg (12/23 0640) SpO2:  [93 %-97 %] 97 % (12/23 0640) Last BM Date: 05/22/15  Intake/Output from previous day: 12/22 0701 - 12/23 0700 In: 3200.8 [I.V.:3020.8; NG/GT:180] Out: 550 [Emesis/NG output:550] Intake/Output this shift:    PE: Gen:  Alert, NAD, pleasant Abd: Soft, obese, mild distension, much less tender, +BS, no HSM, scars noted   Lab Results:   Recent Labs  05/22/15 0531 05/23/15 0610  WBC 7.7 8.5  HGB 15.5 14.5  HCT 46.2 44.5  PLT 299 281   BMET  Recent Labs  05/22/15 0531 05/23/15 0610  NA 137 140  K 3.6 3.8  CL 101 106  CO2 27 28  GLUCOSE 143* 120*  BUN 14 12  CREATININE 0.84 0.90  CALCIUM 8.9 8.6*   PT/INR No results for input(s): LABPROT, INR in the last 72 hours. CMP     Component Value Date/Time   NA 140 05/23/2015 0610   K 3.8 05/23/2015 0610   CL 106 05/23/2015 0610   CO2 28 05/23/2015 0610   GLUCOSE 120* 05/23/2015 0610   BUN 12 05/23/2015 0610   CREATININE 0.90 05/23/2015 0610   CALCIUM 8.6* 05/23/2015 0610   PROT 7.5 05/21/2015 2034   ALBUMIN 4.0 05/21/2015 2034   AST 27 05/21/2015 2034   ALT 37 05/21/2015 2034   ALKPHOS 52 05/21/2015 2034   BILITOT 0.9 05/21/2015 2034   GFRNONAA >60 05/23/2015 0610   GFRAA >60 05/23/2015 0610   Lipase     Component Value Date/Time   LIPASE 25 05/21/2015 2034       Studies/Results: Ct Abdomen Pelvis W Contrast  05/21/2015  CLINICAL DATA:  Upper abdominal pain with nausea, vomiting and dizziness for 2 days EXAM: CT  ABDOMEN AND PELVIS WITH CONTRAST TECHNIQUE: Multidetector CT imaging of the abdomen and pelvis was performed using the standard protocol following bolus administration of intravenous contrast. CONTRAST:  100mL OMNIPAQUE IOHEXOL 300 MG/ML  SOLN COMPARISON:  07/17/2012 FINDINGS: Lower chest:  Clear lung bases.  Normal heart size. Hepatobiliary: Low attenuation of the liver as can be seen with hepatic steatosis. No focal hepatic mass. Prior cholecystectomy. Pancreas: Normal. Spleen: Normal. Adrenals/Urinary Tract: Normal adrenal glands. Normal kidneys. No urolithiasis or obstructive uropathy. Normal bladder. Stomach/Bowel: Small bowel dilatation measuring up to 3.1 cm with multiple air-fluid levels. There is a relative transition point in the left mid abdomen. No pneumatosis, pneumoperitoneum or portal venous gas. No abdominal or pelvic free fluid. Small fat containing supraumbilical hernia. Vascular/Lymphatic: Normal caliber abdominal aorta. No lymphadenopathy. Other: No fluid collection or hematoma. Musculoskeletal: No acute osseous abnormality. No lytic or sclerotic osseous lesion. Degenerative disc disease and facet arthropathy at L4-5 and L5-S1. IMPRESSION: 1. Findings most compatible with a small bowel obstruction with a relative transition point in the left mid abdomen. 2. Hepatic steatosis. Electronically Signed   By: Elige KoHetal  Patel   On: 05/21/2015 22:49   Dg Abd Acute W/chest  05/21/2015  CLINICAL DATA:  Initial evaluation for acute  abdominal pain for 1 day, vomiting. EXAM: DG ABDOMEN ACUTE W/ 1V CHEST COMPARISON:  Prior study from 06/04/2013. FINDINGS: Cardiac and mediastinal silhouettes are within normal limits. Lungs are mildly hypoinflated. Mild bibasilar subsegmental atelectasis. No consolidative airspace disease. No pulmonary edema or pleural effusion. No pneumothorax. Multiple mildly prominent gas-filled loops of small bowel seen scattered throughout the abdomen measuring up to 4.7 cm in diameter.  Few scattered air-fluid levels on upright projection. There are some normal caliber loops of gas-filled bowel localized within the right lower quadrant. Findings suspicious for possible underlying obstructive process. No soft tissue mass or abnormal calcification. Cholecystectomy clips noted. Scoliosis with mild multilevel degenerative changes noted within the visualized spine. No acute osseous abnormality. IMPRESSION: 1. Multiple gas-filled loops of small bowel measuring up to 4.7 cm in diameter with few scattered air-fluid levels. Findings concerning for possible underlying obstructive process. This could be further assessed with dedicated cross-sectional imaging as indicated. 2. Minimal bibasilar atelectasis. No other active cardiopulmonary disease Electronically Signed   By: Rise Mu M.D.   On: 05/21/2015 21:07   Dg Abd Portable 1v-small Bowel Obstruction Protocol-initial, 8 Hr Delay  05/22/2015  CLINICAL DATA:  Evaluation for small bowel obstruction with oral contrast given at 13:15 today. EXAM: PORTABLE ABDOMEN - 1 VIEW COMPARISON:  Earlier the same date. FINDINGS: There is gas-filled distended loops of small bowel within the central abdomen with maximum transverse diameter of 4.3 cm. Oral contrast is seen throughout the colon. There is no evidence of pneumatosis or pneumoperitoneum. Cholecystectomy clips are noted. IMPRESSION: Persistently dilated small bowel loops in the mid abdomen, with contrast opacification of the colon. Findings are consistent with intermittent or incomplete small bowel obstruction. Electronically Signed   By: Ted Mcalpine M.D.   On: 05/22/2015 21:56   Dg Abd Portable 1v-small Bowel Protocol-position Verification  05/22/2015  CLINICAL DATA:  Enteric tube placement EXAM: PORTABLE ABDOMEN - 1 VIEW COMPARISON:  Chest CT from 1 day prior FINDINGS: Enteric tube terminates in the gastric fundus. There are moderately dilated small bowel loops in the left and central  abdomen, increased in the interval. Mild stool in the colon. Right upper quadrant cholecystectomy clips. No evidence of pneumatosis or pneumoperitoneum. IMPRESSION: 1. Enteric tube terminates in the gastric fundus. 2. Increased dilatation of small bowel loops in the left and central abdomen, in keeping with worsening mid to distal small bowel obstruction. Electronically Signed   By: Delbert Phenix M.D.   On: 05/22/2015 08:09    Anti-infectives: Anti-infectives    None       Assessment/Plan pSBO Incisional hernia (fat containing) Chronic abdominal bloating -Contrast in colon on 8 hour delay, has had 3 BM's, still has intermittent pain -Minimal NG output so will clamp NG and start clears during trial -IVF, pain control, antiemetics -Ambulate and IS  H/o cholecystectomy and appendectomy    LOS: 2 days    Nonie Hoyer 05/23/2015, 7:33 AM Pager: [phone]

## 2015-05-23 NOTE — Progress Notes (Signed)
Residual checked, 5mL.  Patient mildly nauseas.  Patient requested tube to remain in place, still clamped. MD notified.

## 2015-05-24 MED ORDER — PANTOPRAZOLE SODIUM 40 MG PO TBEC: 40.0000 mg | DELAYED_RELEASE_TABLET | Freq: Every day | ORAL | Status: DC

## 2015-05-24 NOTE — Progress Notes (Signed)
Patient ID: Jonathan Burke, male   DOB: 03/20/1961, 54 y.o.   MRN: [id number]  General [facility] - [facility]  HD#: 4  Subjective: Patient up in chair, wife at bedside.  Wants NG out and to go home today.  Tolerating clear liquid diet, going to full liquids this AM.  BM this AM.  Objective: Vital signs in last 24 hours: Temp:  [97.7 F (36.5 C)-98.5 F (36.9 C)] 98.3 F (36.8 C) (12/24 0553) Pulse Rate:  [65-78] 65 (12/24 0553) Resp:  [18-20] 20 (12/24 0553) BP: (124-138)/(81-94) 127/82 mmHg (12/24 0553) SpO2:  [95 %-98 %] 95 % (12/24 0553) Last BM Date: 05/24/15  Intake/Output from previous day: 12/23 0701 - 12/24 0700 In: 2085 [P.O.:120; I.V.:1965] Out: -  Intake/Output this shift:    Physical Exam: HEENT - sclerae clear, mucous membranes moist Neck - soft Chest - clear bilaterally Cor - RRR Abdomen - soft, protuberant; BS present Ext - no edema, non-tender Neuro - alert & oriented, no focal deficits  Lab Results:   Recent Labs  05/22/15 0531 05/23/15 0610  WBC 7.7 8.5  HGB 15.5 14.5  HCT 46.2 44.5  PLT 299 281   BMET  Recent Labs  05/22/15 0531 05/23/15 0610  NA 137 140  K 3.6 3.8  CL 101 106  CO2 27 28  GLUCOSE 143* 120*  BUN 14 12  CREATININE 0.84 0.90  CALCIUM 8.9 8.6*   PT/INR No results for input(s): LABPROT, INR in the last 72 hours. Comprehensive Metabolic Panel:    Component Value Date/Time   NA 140 05/23/2015 0610   NA 137 05/22/2015 0531   K 3.8 05/23/2015 0610   K 3.6 05/22/2015 0531   CL 106 05/23/2015 0610   CL 101 05/22/2015 0531   CO2 28 05/23/2015 0610   CO2 27 05/22/2015 0531   BUN 12 05/23/2015 0610   BUN 14 05/22/2015 0531   CREATININE 0.90 05/23/2015 0610   CREATININE 0.84 05/22/2015 0531   GLUCOSE 120* 05/23/2015 0610   GLUCOSE 143* 05/22/2015 0531   CALCIUM 8.6* 05/23/2015 0610   CALCIUM 8.9 05/22/2015 0531   AST 27 05/21/2015 2034   AST 13 07/17/2012 1833   ALT 37 05/21/2015 2034   ALT 23  07/17/2012 1833   ALKPHOS 52 05/21/2015 2034   ALKPHOS 44 07/17/2012 1833   BILITOT 0.9 05/21/2015 2034   BILITOT 0.8 07/17/2012 1833   PROT 7.5 05/21/2015 2034   PROT 8.1 07/17/2012 1833   ALBUMIN 4.0 05/21/2015 2034   ALBUMIN 4.1 07/17/2012 1833    Studies/Results: Dg Abd Portable 1v-small Bowel Obstruction Protocol-initial, 8 Hr Delay  05/22/2015  CLINICAL DATA:  Evaluation for small bowel obstruction with oral contrast given at 13:15 today. EXAM: PORTABLE ABDOMEN - 1 VIEW COMPARISON:  Earlier the same date. FINDINGS: There is gas-filled distended loops of small bowel within the central abdomen with maximum transverse diameter of 4.3 cm. Oral contrast is seen throughout the colon. There is no evidence of pneumatosis or pneumoperitoneum. Cholecystectomy clips are noted. IMPRESSION: Persistently dilated small bowel loops in the mid abdomen, with contrast opacification of the colon. Findings are consistent with intermittent or incomplete small bowel obstruction. Electronically Signed   By: Ted Mcalpineobrinka  Dimitrova M.D.   On: 05/22/2015 21:56    Anti-infectives: Anti-infectives    None      Assessment & Plans: Partial SBO Incisional hernia (fat containing) Chronic abdominal bloating Hx of cholecystectomy and appendectomy  Discontinue NG tube this AM  Full liquid diet  Ambulate in halls  Possibly home this afternoon  Follow up with Dr. Magnus Ivan if symptoms persist at [facility] office  Velora Heckler, MD, [facility] Office: [phone]   Aika Brzoska Judie Petit 05/24/2015

## 2015-05-24 NOTE — Progress Notes (Signed)
Pt is getting discharged home this pm. Discharge instructions provided with no concerns voiced. Condition stable

## 2015-05-24 NOTE — Progress Notes (Signed)
NGT removed as ordered by MD

## 2015-05-24 NOTE — Discharge Summary (Signed)
Physician Discharge Summary  Patient ID: Jonathan Burke MRN: [id number] DOB/AGE: 54/09/1960 54 y.o.  Admit date: 05/21/2015 Discharge date: 05/24/2015  Admission Diagnoses: Partial Small bowel obstruction  Discharge Diagnoses: Partial small bowel obstruction, resolved Active Problems:   Partial small bowel obstruction [facility](HCC)   Discharged Condition: good  Hospital Course: [name] was admitted with a partial small bowel obstruction. He has a previous history of this. He was managed conservatively and improved without surgery. He is discharged in stable condition.  Consults: None  Significant Diagnostic Studies: ct  Treatments: IV hydration  Discharge Exam: Blood pressure 127/82, pulse 65, temperature 98.3 F (36.8 C), temperature source Oral, resp. rate 20, height 5\' 8"  (1.727 m), weight 103.2 kg (227 lb 8.2 oz), SpO2 95 %. see progress note  Disposition: 01-Home or Self Care  Discharge Instructions    Diet - low sodium heart healthy    Complete by:  As directed      Discharge instructions    Complete by:  As directed   If symptoms persist, call [facility] at [phone] to make an appointment with Dr. Magnus IvanBlackman.     Increase activity slowly    Complete by:  As directed             Medication List    TAKE these medications        CELEBREX PO  Take 1 tablet by mouth daily.     metoCLOPramide 10 MG tablet  Commonly known as:  REGLAN  Take 1 tablet (10 mg total) by mouth 3 (three) times daily before meals.     ondansetron 4 MG tablet  Commonly known as:  ZOFRAN  Take 1 tablet (4 mg total) by mouth every 8 (eight) hours as needed for nausea or vomiting.     pantoprazole 40 MG tablet  Commonly known as:  PROTONIX  Take 1 tablet (40 mg total) by mouth daily.     tadalafil 20 MG tablet  Commonly known as:  CIALIS  Take 0.5-1 tablets (10-20 mg total) by mouth every other day as needed for erectile dysfunction.     Vitamin D 2000 UNITS Caps  Take 4,000 Units by mouth  daily.           Follow-up Information    Follow up with Shelly RubensteinBLACKMAN,DOUGLAS A, MD.   Specialty:  General Surgery   Why:  As needed   Contact information:   [address] [place] [address] [phone]       Signed: Liz MaladyHOMPSON,Eddye Broxterman E 05/24/2015, 6:11 PM

## 2016-07-06 ENCOUNTER — Inpatient Hospital Stay (HOSPITAL_COMMUNITY)
Admission: EM | Admit: 2016-07-06 | Discharge: 2016-07-10 | DRG: 390 | Disposition: A | Discharge status: Home / Self Care | Payer: BLUE CROSS/BLUE SHIELD | Attending: General Surgery | Admitting: General Surgery

## 2016-07-06 ENCOUNTER — Encounter (HOSPITAL_COMMUNITY): Payer: Self-pay

## 2016-07-06 ENCOUNTER — Emergency Department (HOSPITAL_COMMUNITY): Payer: BLUE CROSS/BLUE SHIELD

## 2016-07-06 DIAGNOSIS — K56609 Unspecified intestinal obstruction, unspecified as to partial versus complete obstruction: Secondary | ICD-10-CM | POA: Diagnosis not present

## 2016-07-06 DIAGNOSIS — N400 Enlarged prostate without lower urinary tract symptoms: Secondary | ICD-10-CM | POA: Diagnosis present

## 2016-07-06 DIAGNOSIS — Z6833 Body mass index (BMI) 33.0-33.9, adult: Secondary | ICD-10-CM

## 2016-07-06 DIAGNOSIS — Z91048 Other nonmedicinal substance allergy status: Secondary | ICD-10-CM | POA: Diagnosis not present

## 2016-07-06 DIAGNOSIS — R1084 Generalized abdominal pain: Secondary | ICD-10-CM

## 2016-07-06 DIAGNOSIS — Z0189 Encounter for other specified special examinations: Secondary | ICD-10-CM

## 2016-07-06 DIAGNOSIS — Z9049 Acquired absence of other specified parts of digestive tract: Secondary | ICD-10-CM | POA: Diagnosis not present

## 2016-07-06 DIAGNOSIS — K5669 Other partial intestinal obstruction: Secondary | ICD-10-CM | POA: Diagnosis not present

## 2016-07-06 DIAGNOSIS — D72829 Elevated white blood cell count, unspecified: Secondary | ICD-10-CM | POA: Diagnosis present

## 2016-07-06 DIAGNOSIS — Z23 Encounter for immunization: Secondary | ICD-10-CM | POA: Diagnosis not present

## 2016-07-06 DIAGNOSIS — Z8261 Family history of arthritis: Secondary | ICD-10-CM

## 2016-07-06 DIAGNOSIS — K56699 Other intestinal obstruction unspecified as to partial versus complete obstruction: Secondary | ICD-10-CM | POA: Diagnosis not present

## 2016-07-06 DIAGNOSIS — E785 Hyperlipidemia, unspecified: Secondary | ICD-10-CM | POA: Diagnosis present

## 2016-07-06 DIAGNOSIS — Z8249 Family history of ischemic heart disease and other diseases of the circulatory system: Secondary | ICD-10-CM | POA: Diagnosis not present

## 2016-07-06 DIAGNOSIS — K219 Gastro-esophageal reflux disease without esophagitis: Secondary | ICD-10-CM | POA: Diagnosis not present

## 2016-07-06 DIAGNOSIS — K449 Diaphragmatic hernia without obstruction or gangrene: Secondary | ICD-10-CM | POA: Diagnosis not present

## 2016-07-06 DIAGNOSIS — R109 Unspecified abdominal pain: Secondary | ICD-10-CM | POA: Diagnosis not present

## 2016-07-06 DIAGNOSIS — Z8719 Personal history of other diseases of the digestive system: Secondary | ICD-10-CM

## 2016-07-06 DIAGNOSIS — K56600 Partial intestinal obstruction, unspecified as to cause: Secondary | ICD-10-CM | POA: Diagnosis not present

## 2016-07-06 DIAGNOSIS — R1110 Vomiting, unspecified: Secondary | ICD-10-CM | POA: Diagnosis not present

## 2016-07-06 DIAGNOSIS — R197 Diarrhea, unspecified: Secondary | ICD-10-CM | POA: Diagnosis not present

## 2016-07-06 DIAGNOSIS — E669 Obesity, unspecified: Secondary | ICD-10-CM | POA: Diagnosis present

## 2016-07-06 DIAGNOSIS — Z4682 Encounter for fitting and adjustment of non-vascular catheter: Secondary | ICD-10-CM | POA: Diagnosis not present

## 2016-07-06 DIAGNOSIS — Z431 Encounter for attention to gastrostomy: Secondary | ICD-10-CM | POA: Diagnosis not present

## 2016-07-06 DIAGNOSIS — K432 Incisional hernia without obstruction or gangrene: Secondary | ICD-10-CM | POA: Diagnosis present

## 2016-07-06 LAB — URINALYSIS, ROUTINE W REFLEX MICROSCOPIC
BACTERIA UA: NONE SEEN
BILIRUBIN URINE: NEGATIVE
Glucose, UA: NEGATIVE mg/dL
KETONES UR: NEGATIVE mg/dL
LEUKOCYTES UA: NEGATIVE
NITRITE: NEGATIVE
Protein, ur: 30 mg/dL — AB
SQUAMOUS EPITHELIAL / LPF: NONE SEEN
Specific Gravity, Urine: 1.028 (ref 1.005–1.030)
pH: 5 (ref 5.0–8.0)

## 2016-07-06 LAB — COMPREHENSIVE METABOLIC PANEL
ALT: 41 U/L (ref 17–63)
ANION GAP: 10 (ref 5–15)
AST: 28 U/L (ref 15–41)
Albumin: 4.4 g/dL (ref 3.5–5.0)
Alkaline Phosphatase: 47 U/L (ref 38–126)
BUN: 14 mg/dL (ref 6–20)
CHLORIDE: 106 mmol/L (ref 101–111)
CO2: 24 mmol/L (ref 22–32)
Calcium: 9.4 mg/dL (ref 8.9–10.3)
Creatinine, Ser: 0.91 mg/dL (ref 0.61–1.24)
GFR calc Af Amer: >60 mL/min (ref >60)
Glucose, Bld: 131 mg/dL — ABNORMAL HIGH (ref 65–99)
POTASSIUM: 3.8 mmol/L (ref 3.5–5.1)
Sodium: 140 mmol/L (ref 135–145)
TOTAL PROTEIN: 7.8 g/dL (ref 6.5–8.1)
Total Bilirubin: 0.9 mg/dL (ref 0.3–1.2)

## 2016-07-06 LAB — LIPASE, BLOOD: LIPASE: 25 U/L (ref 11–51)

## 2016-07-06 LAB — CBC
HEMATOCRIT: 48.6 % (ref 39.0–52.0)
HEMOGLOBIN: 16.8 g/dL (ref 13.0–17.0)
MCH: 31.2 pg (ref 26.0–34.0)
MCHC: 34.6 g/dL (ref 30.0–36.0)
MCV: 90.2 fL (ref 78.0–100.0)
Platelets: 265 10*3/uL (ref 150–400)
RBC: 5.39 MIL/uL (ref 4.22–5.81)
RDW: 12.8 % (ref 11.5–15.5)
WBC: 15.7 10*3/uL — AB (ref 4.0–10.5)

## 2016-07-06 MED ORDER — IOPAMIDOL (ISOVUE-300) INJECTION 61%
INTRAVENOUS | Status: AC
  Administered 2016-07-06: 100 mL
  Filled 2016-07-06: qty 100

## 2016-07-06 MED ORDER — DEXTROSE-NACL 5-0.9 % IV SOLN
INTRAVENOUS | Status: DC
Start: 2016-07-06 — End: 2016-07-10
  Administered 2016-07-07 – 2016-07-10 (×6): via INTRAVENOUS

## 2016-07-06 MED ORDER — HYDROMORPHONE HCL 2 MG/ML IJ SOLN
1.0000 mg | Freq: Once | INTRAMUSCULAR | Status: AC
  Administered 2016-07-06: 1 mg via INTRAVENOUS
  Filled 2016-07-06: qty 1

## 2016-07-06 MED ORDER — ENOXAPARIN SODIUM 40 MG/0.4ML ~~LOC~~ SOLN
40.0000 mg | Freq: Every day | SUBCUTANEOUS | Status: DC
  Administered 2016-07-07 – 2016-07-09 (×4): 40 mg via SUBCUTANEOUS
  Filled 2016-07-06 (×4): qty 0.4

## 2016-07-06 MED ORDER — ONDANSETRON 4 MG PO TBDP
4.0000 mg | ORAL_TABLET | Freq: Four times a day (QID) | ORAL | Status: DC | PRN
  Filled 2016-07-06: qty 1

## 2016-07-06 MED ORDER — ONDANSETRON HCL 4 MG/2ML IJ SOLN
4.0000 mg | Freq: Four times a day (QID) | INTRAMUSCULAR | Status: DC | PRN
  Administered 2016-07-07 – 2016-07-09 (×4): 4 mg via INTRAVENOUS
  Filled 2016-07-06 (×4): qty 2

## 2016-07-06 MED ORDER — ONDANSETRON HCL 4 MG/2ML IJ SOLN
4.0000 mg | Freq: Once | INTRAMUSCULAR | Status: AC
  Administered 2016-07-06: 4 mg via INTRAVENOUS
  Filled 2016-07-06: qty 2

## 2016-07-06 MED ORDER — HYDROMORPHONE HCL 2 MG/ML IJ SOLN
1.0000 mg | INTRAMUSCULAR | Status: DC | PRN
  Administered 2016-07-07 (×3): 2 mg via INTRAVENOUS
  Filled 2016-07-06 (×3): qty 1

## 2016-07-06 NOTE — ED Provider Notes (Addendum)
Jonathan Burke is a 56 y.o. male, with a history of SBO and ventral hernia, presenting to the ED with abdominal pain beginning suddenly at 1 AM this morning. It woke him out of a sleep and was following by repeated belching. Pain was severe, "felt like the belly was going to explode with pressure,", located in the mid to upper abdomen, nonradiating. He also endorses vomiting after this. Pt states he has had at least 3 previous SBOs. He has not had surgery for SBO.  His last normal BM was three days ago and watery since then.   Last oral intake was the evening of February 5.  HPI from Ivar Drape, PA-C: "Patient with past medical history remarkable for prior SBO and hernia presents to the emergency department with chief complaint of abdominal pain. He states the symptoms started last night around 1 AM. He reports that the pain is severe. He reports associated nausea, vomiting, diarrhea. He states this feels similar to when he had his last small bowel obstruction. He has not taken anything for his symptoms. His last episode of vomiting was around lunchtime today. He denies any other associated symptoms. The symptoms are worsened with movement and palpation."   Past Medical History:  Diagnosis Date  . Blood in urine   . 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 Complication of anesthesia    states is hard to wake up  . Dental crowns present    also caps  . Enlarged prostate   . GERD (gastroesophageal reflux disease)    daily OTC  . H/O hiatal hernia    pt unsure if hernia or not, but has a knot in his stomach  . Headache(784.0)    tension  . PONV (postoperative nausea and vomiting)   . Small bowel obstruction 07/17/2012   Physical Exam  BP 129/87   Pulse 87   Temp 97.8 F (36.6 C) (Oral)   Resp 18   SpO2 95%   Physical Exam  Constitutional: He appears well-developed and well-nourished. No distress.  HENT:  Head: Normocephalic and atraumatic.  Eyes: Conjunctivae are normal.   Neck: Neck supple.  Cardiovascular: Normal rate, regular rhythm, normal heart sounds and intact distal pulses.   Pulmonary/Chest: Effort normal and breath sounds normal. No respiratory distress.  Abdominal: Soft. He exhibits distension. There is tenderness in the epigastric area, periumbilical area and left upper quadrant. There is no guarding.  Musculoskeletal: He exhibits no edema.  Lymphadenopathy:    He has no cervical adenopathy.  Neurological: He is alert.  Skin: Skin is warm and dry. He is not diaphoretic.  Psychiatric: He has a normal mood and affect. His behavior is normal.  Nursing note and vitals reviewed.  Results for orders placed or performed during the hospital encounter of 07/06/16  Lipase, blood  Result Value Ref Range   Lipase 25 11 - 51 U/L  Comprehensive metabolic panel  Result Value Ref Range   Sodium 140 135 - 145 mmol/L   Potassium 3.8 3.5 - 5.1 mmol/L   Chloride 106 101 - 111 mmol/L   CO2 24 22 - 32 mmol/L   Glucose, Bld 131 (H) 65 - 99 mg/dL   BUN 14 6 - 20 mg/dL   Creatinine, Ser 1.61 0.61 - 1.24 mg/dL   Calcium 9.4 8.9 - 09.6 mg/dL   Total Protein 7.8 6.5 - 8.1 g/dL   Albumin 4.4 3.5 - 5.0 g/dL   AST 28 15 - 41 U/L  ALT 41 17 - 63 U/L   Alkaline Phosphatase 47 38 - 126 U/L   Total Bilirubin 0.9 0.3 - 1.2 mg/dL   GFR calc non Af Amer >60 >60 mL/min   GFR calc Af Amer >60 >60 mL/min   Anion gap 10 5 - 15  CBC  Result Value Ref Range   WBC 15.7 (H) 4.0 - 10.5 K/uL   RBC 5.39 4.22 - 5.81 MIL/uL   Hemoglobin 16.8 13.0 - 17.0 g/dL   HCT 69.6 29.5 - 28.4 %   MCV 90.2 78.0 - 100.0 fL   MCH 31.2 26.0 - 34.0 pg   MCHC 34.6 30.0 - 36.0 g/dL   RDW 13.2 44.0 - 10.2 %   Platelets 265 150 - 400 K/uL  Urinalysis, Routine w reflex microscopic  Result Value Ref Range   Color, Urine YELLOW YELLOW   APPearance TURBID (A) CLEAR   Specific Gravity, Urine 1.028 1.005 - 1.030   pH 5.0 5.0 - 8.0   Glucose, UA NEGATIVE NEGATIVE mg/dL   Hgb urine dipstick  MODERATE (A) NEGATIVE   Bilirubin Urine NEGATIVE NEGATIVE   Ketones, ur NEGATIVE NEGATIVE mg/dL   Protein, ur 30 (A) NEGATIVE mg/dL   Nitrite NEGATIVE NEGATIVE   Leukocytes, UA NEGATIVE NEGATIVE   RBC / HPF 6-30 0 - 5 RBC/hpf   WBC, UA 0-5 0 - 5 WBC/hpf   Bacteria, UA NONE SEEN NONE SEEN   Squamous Epithelial / LPF NONE SEEN NONE SEEN   Mucous PRESENT    Ct Abdomen Pelvis W Contrast  Result Date: 07/06/2016 CLINICAL DATA:  Abdominal pain, gas, diarrhea and constipation. Vomiting x1 day. History of small bowel obstruction with lysis of adhesions. EXAM: CT ABDOMEN AND PELVIS WITH CONTRAST TECHNIQUE: Multidetector CT imaging of the abdomen and pelvis was performed using the standard protocol following bolus administration of intravenous contrast. CONTRAST:  ISOVUE-300 IOPAMIDOL (ISOVUE-300) INJECTION 61% COMPARISON:  CT from 05/21/2015 FINDINGS: Lower chest: Nonacute Hepatobiliary: Hepatic steatosis. Cholecystectomy. No biliary dilatation. No focal mass. Pancreas: Normal Spleen: No splenomegaly or mass. Adrenals/Urinary Tract: Normal bilateral adrenal glands. Symmetric enhancement of both kidneys without obstructive uropathy, calculus or mass. Nondistended urinary bladder. Stomach/Bowel: Moderate fluid and gaseous distention of stomach. Normal small bowel rotation. Fluid-filled moderate distention of jejunal loops with transition point seen on series 2, image 55 in the mid abdomen at the level of the umbilicus. Small bowel loops are dilated up to 2.9 cm. Ileal loops are contracted in appearance. Stool and gas noted within nonobstructed nondistended large bowel. Scattered colonic diverticulosis along the transverse colon. Appendectomy. Vascular/Lymphatic: No significant vascular findings are present. No enlarged abdominal or pelvic lymph nodes. Reproductive: Normal size prostate with central zone calcifications. Other: There is no free air free fluid. Supraumbilical fat containing hernias appear  stable, the most cephalad measuring approximately 1.6 cm transverse and two additional fat containing hernias also containing fat further caudad with the mouth of the hernias measuring 1.6 and 2.5 cm transverse respectively. Musculoskeletal: Degenerative disc disease L4-5 and L5-S1. No acute osseous abnormality. No suspicious osseous abnormalities. IMPRESSION: 1. Dilated fluid-filled jejunal loops with transition point in the mid abdomen deep to the umbilicus consistent with early or partial SBO. Findings may be secondary to adhesions. 2. Hepatic steatosis. 3. Cholecystectomy and appendectomy. Electronically Signed   By: Tollie Eth M.D.   On: 07/06/2016 21:00    ED Course  Procedures  MDM  Took patient care handoff report from [employer], [place]. Rule out SBO.  Evidence of possible SBO on abdominal CT. Patient states his pain has come down from a 10/10 to 5/10. 10:04 PM Spoke with Dr. Derrell Lollingamirez, general surgeon, who states he will come by to evaluate the patient. General surgery Valuation and the patient and decided to admit.    Vitals:   07/06/16 1845 07/06/16 1900 07/06/16 1915 07/06/16 1930  BP: 109/71 110/77 115/77 129/87  Pulse: 91 86 88 87  Resp: 16 16 16 18   Temp:      TempSrc:      SpO2: 93% 96% 94% 95%   Vitals:   07/06/16 1930 07/06/16 2000 07/06/16 2200 07/06/16 2215  BP: 129/87 115/74 114/80 120/84  Pulse: 87 87 79 79  Resp: 18     Temp:      TempSrc:      SpO2: 95% 91% 95% 94%          Anselm PancoastShawn C Joy, PA-C 07/06/16 2326    Canary Brimhristopher J Tegeler, MD 07/07/16 1116   Clinical Impression: 1. SBO (small bowel obstruction)   2. Encounter for imaging study to confirm nasogastric (NG) tube placement   3. Small bowel obstruction   4. Hx SBO     Disposition: Admit to Surgery    Heide Scaleshristopher J Tegeler, MD 07/13/16 [phone]

## 2016-07-06 NOTE — ED Notes (Signed)
Pt back from CT

## 2016-07-06 NOTE — ED Provider Notes (Signed)
[facility]-EMERGENCY DEPT Provider Note   CSN: [id number] Arrival date & time: 07/06/16  1353     History   Chief Complaint Chief Complaint  Patient presents with  . Abdominal Pain    HPI Jonathan Burke is a 56 y.o. male.  Patient with past medical history remarkable for prior SBO and hernia presents to the emergency department with chief complaint of abdominal pain. He states the symptoms started last night around 1 AM. He reports that the pain is severe. He reports associated nausea, vomiting, diarrhea. He states this feels similar to when he had his last small bowel obstruction. He has not taken anything for his symptoms. His last episode of vomiting was around lunchtime today. He denies any other associated symptoms. The symptoms are worsened with movement and palpation.   The history is provided by the patient. No language interpreter was used.    Past Medical History:  Diagnosis Date  . Blood in urine   . 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 Complication of anesthesia    states is hard to wake up  . Dental crowns present    also caps  . Enlarged prostate   . GERD (gastroesophageal reflux disease)    daily OTC  . H/O hiatal hernia    pt unsure if hernia or not, but has a knot in his stomach  . Headache(784.0)    tension  . PONV (postoperative nausea and vomiting)   . Small bowel obstruction 07/17/2012    Patient Active Problem List   Diagnosis Date Noted  . Partial small bowel obstruction 05/21/2015  . Dyslipidemia (high LDL; low HDL) 09/05/2012  . BPH (benign prostatic hyperplasia) 09/05/2012  . SBO (small bowel obstruction) 07/17/2012  . GERD (gastroesophageal reflux disease) 07/17/2012  . Obesity (BMI 30-39.9) 07/17/2012    Past Surgical History:  Procedure Laterality Date  . APPENDECTOMY    . CARPAL TUNNEL RELEASE  09/09/2011   Procedure: CARPAL TUNNEL RELEASE;  Surgeon: Wyn Forster., MD;  Location: [facility];  Service:  Orthopedics;  Laterality: Left;  . CHOLECYSTECTOMY    . 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 ESOPHAGOGASTRODUODENOSCOPY N/A 06/05/2013   Procedure: ESOPHAGOGASTRODUODENOSCOPY (EGD);  Surgeon: Vertell Novak., MD;  Location: [facility];  Service: Endoscopy;  Laterality: N/A;  . LAPAROSCOPIC LYSIS OF ADHESIONS N/A 05/29/2013   Procedure: DIAGNOSTIC LAPAROSCOPY CONVERTED TO EXPLORATORY LAPAROTOMY, LYSIS OF ADHESIONS ;  Surgeon: Shelly Rubenstein, MD;  Location: [facility] OR;  Service: General;  Laterality: N/A;  . LUMBAR LAMINECTOMY/DECOMPRESSION MICRODISCECTOMY  10/29/1999   L5-S1  . SHOULDER SURGERY     left       Home Medications    Prior to Admission medications   Medication Sig Start Date End Date Taking? Authorizing Provider  Celecoxib (CELEBREX PO) Take 1 tablet by mouth daily.    Historical Provider, MD  Cholecalciferol (VITAMIN D) 2000 UNITS CAPS Take 4,000 Units by mouth daily.    Historical Provider, MD  metoCLOPramide (REGLAN) 10 MG tablet Take 1 tablet (10 mg total) by mouth 3 (three) times daily before meals. Patient not taking: Reported on 05/21/2015 06/05/13   Abigail Miyamoto, MD  ondansetron (ZOFRAN) 4 MG tablet Take 1 tablet (4 mg total) by mouth every 8 (eight) hours as needed for nausea or vomiting. Patient not taking: Reported on 05/21/2015 06/08/13   Abigail Miyamoto, MD  pantoprazole (PROTONIX) 40 MG tablet Take  1 tablet (40 mg total) by mouth daily. 06/05/13   Abigail Miyamoto, MD  tadalafil (CIALIS) 20 MG tablet Take 0.5-1 tablets (10-20 mg total) by mouth every other day as needed for erectile dysfunction. 05/17/14   Deatra Canter, FNP    Family History Family History  Problem Relation Age of Onset  . Heart attack Father   . Arthritis Sister   . Hypertension Brother   . Hypertension Sister     Social History Social History  Substance Use Topics  . Smoking status: Never Smoker  . Smokeless  tobacco: Never Used  . Alcohol use No     Allergies   Adhesive [tape]   Review of Systems Review of Systems  Gastrointestinal: Positive for abdominal pain, diarrhea, nausea and vomiting.  All other systems reviewed and are negative.    Physical Exam Updated Vital Signs BP 108/78 (BP Location: Right Arm)   Pulse 105   Temp 97.8 F (36.6 C) (Oral)   Resp 20   SpO2 95%   Physical Exam  Constitutional: He is oriented to person, place, and time. He appears well-developed and well-nourished.  HENT:  Head: Normocephalic and atraumatic.  Eyes: Conjunctivae and EOM are normal. Pupils are equal, round, and reactive to light. Right eye exhibits no discharge. Left eye exhibits no discharge. No scleral icterus.  Neck: Normal range of motion. Neck supple. No JVD present.  Cardiovascular: Regular rhythm and normal heart sounds.  Exam reveals no gallop and no friction rub.   No murmur heard. Mild tachycardia  Pulmonary/Chest: Effort normal and breath sounds normal. No respiratory distress. He has no wheezes. He has no rales. He exhibits no tenderness.  Abdominal: Soft. He exhibits no distension and no mass. There is tenderness. There is no rebound and no guarding.  Musculoskeletal: Normal range of motion. He exhibits no edema or tenderness.  Neurological: He is alert and oriented to person, place, and time.  Skin: Skin is warm and dry.  Psychiatric: He has a normal mood and affect. His behavior is normal. Judgment and thought content normal.  Nursing note and vitals reviewed.    ED Treatments / Results  Labs (all labs ordered are listed, but only abnormal results are displayed) Labs Reviewed  COMPREHENSIVE METABOLIC PANEL - Abnormal; Notable for the following:       Result Value   Glucose, Bld 131 (*)    All other components within normal limits  CBC - Abnormal; Notable for the following:    WBC 15.7 (*)    All other components within normal limits  LIPASE, BLOOD  URINALYSIS,  ROUTINE W REFLEX MICROSCOPIC    EKG  EKG Interpretation None       Radiology No results found.  Procedures Procedures (including critical care time)  Medications Ordered in ED Medications  HYDROmorphone (DILAUDID) injection 1 mg (not administered)  ondansetron (ZOFRAN) injection 4 mg (not administered)     Initial Impression / Assessment and Plan / ED Course  I have reviewed the triage vital signs and the nursing notes.  Pertinent labs & imaging results that were available during my care of the patient were reviewed by me and considered in my medical decision making (see chart for details).     Patient with history of small bowel obstructions. Presents with abdominal pain, nausea, vomiting, diarrhea. Leukocytosis to 15.7. Patient is very tender to palpation on abdominal exam. We'll treat pain and nausea medicine. Plan for CT abdomen pelvis to rule out small bowel obstruction or  other acute process.  Joy, PA-C, will continue care.  Plan: Follow-up on CT.    Final Clinical Impressions(s) / ED Diagnoses   Final diagnoses:  None    New Prescriptions New Prescriptions   No medications on file     Roxy HorsemanRobert Beatriz Settles, PA-C 07/06/16 1943    Canary Brimhristopher J Tegeler, MD 07/07/16 1116

## 2016-07-06 NOTE — H&P (Signed)
Jonathan Burke is an 56 y.o. male.   Chief Complaint: Abdominal pain, nausea vomiting, diarrhea HPI: Patient is a 56 year old male with a past history of previous bowel obstructions most recently in the hospital December 2017 for partial small bowel traction. This resolved with nonoperative therapy. Patient had a history of previous diagnostic laparoscopy for bowel obstruction.  Patient states that his nausea, vomiting, diarrhea began at 0100 on the day of admission. He states that the nausea vomiting and diarrhea continued throughout the day. He states that he felt that his abdomen was becoming more distended throughout the day. He states he has no sick contacts.  On evaluation ER patient underwent a CT scan which revealed a possible partial small bowel traction as well as a ventral incisional hernia. Patient also with an elevated WBC count. Gen. surgery was consult for further evaluation and management.  Past Medical History:  Diagnosis Date  . Blood in urine   . 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 Complication of anesthesia    states is hard to wake up  . Dental crowns present    also caps  . Enlarged prostate   . GERD (gastroesophageal reflux disease)    daily OTC  . H/O hiatal hernia    pt unsure if hernia or not, but has a knot in his stomach  . Headache(784.0)    tension  . PONV (postoperative nausea and vomiting)   . Small bowel obstruction 07/17/2012    Past Surgical History:  Procedure Laterality Date  . APPENDECTOMY    . CARPAL TUNNEL RELEASE  09/09/2011   Procedure: CARPAL TUNNEL RELEASE;  Surgeon: Cammie Sickle., MD;  Location: [place];  Service: Orthopedics;  Laterality: Left;  . CHOLECYSTECTOMY    . 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 ESOPHAGOGASTRODUODENOSCOPY N/A 06/05/2013   Procedure: ESOPHAGOGASTRODUODENOSCOPY (EGD);  Surgeon: Winfield Cunas., MD;  Location: [facility]  ENDOSCOPY;  Service: Endoscopy;  Laterality: N/A;  . LAPAROSCOPIC LYSIS OF ADHESIONS N/A 05/29/2013   Procedure: DIAGNOSTIC LAPAROSCOPY CONVERTED TO EXPLORATORY LAPAROTOMY, LYSIS OF ADHESIONS ;  Surgeon: Harl Bowie, MD;  Location: [facility];  Service: General;  Laterality: N/A;  . LUMBAR LAMINECTOMY/DECOMPRESSION MICRODISCECTOMY  10/29/1999   L5-S1  . SHOULDER SURGERY     left    Family History  Problem Relation Age of Onset  . Heart attack Father   . Arthritis Sister   . Hypertension Brother   . Hypertension Sister    Social History:  reports that he has never smoked. He has never used smokeless tobacco. He reports that he does not drink alcohol or use drugs.  Allergies:  Allergies  Allergen Reactions  . Adhesive [Tape] Other (See Comments)    PULLS SKIN OFF. Paper tape only     (Not in a hospital admission)  Results for orders placed or performed during the hospital encounter of 07/06/16 (from the past 48 hour(s))  Lipase, blood     Status: None   Collection Time: 07/06/16  2:07 PM  Result Value Ref Range   Lipase 25 11 - 51 U/L  Comprehensive metabolic panel     Status: Abnormal   Collection Time: 07/06/16  2:07 PM  Result Value Ref Range   Sodium 140 135 - 145 mmol/L   Potassium 3.8 3.5 - 5.1 mmol/L   Chloride 106 101 - 111 mmol/L   CO2 24 22 - 32  mmol/L   Glucose, Bld 131 (H) 65 - 99 mg/dL   BUN 14 6 - 20 mg/dL   Creatinine, Ser 0.91 0.61 - 1.24 mg/dL   Calcium 9.4 8.9 - 10.3 mg/dL   Total Protein 7.8 6.5 - 8.1 g/dL   Albumin 4.4 3.5 - 5.0 g/dL   AST 28 15 - 41 U/L   ALT 41 17 - 63 U/L   Alkaline Phosphatase 47 38 - 126 U/L   Total Bilirubin 0.9 0.3 - 1.2 mg/dL   GFR calc non Af Amer >60 >60 mL/min   GFR calc Af Amer >60 >60 mL/min    Comment: (NOTE) The eGFR has been calculated using the CKD EPI equation. This calculation has not been validated in all clinical situations. eGFR's persistently <60 mL/min signify possible Chronic Kidney Disease.     Anion gap 10 5 - 15  CBC     Status: Abnormal   Collection Time: 07/06/16  2:07 PM  Result Value Ref Range   WBC 15.7 (H) 4.0 - 10.5 K/uL   RBC 5.39 4.22 - 5.81 MIL/uL   Hemoglobin 16.8 13.0 - 17.0 g/dL   HCT 48.6 39.0 - 52.0 %   MCV 90.2 78.0 - 100.0 fL   MCH 31.2 26.0 - 34.0 pg   MCHC 34.6 30.0 - 36.0 g/dL   RDW 12.8 11.5 - 15.5 %   Platelets 265 150 - 400 K/uL  Urinalysis, Routine w reflex microscopic     Status: Abnormal   Collection Time: 07/06/16  8:53 PM  Result Value Ref Range   Color, Urine YELLOW YELLOW   APPearance TURBID (A) CLEAR   Specific Gravity, Urine 1.028 1.005 - 1.030   pH 5.0 5.0 - 8.0   Glucose, UA NEGATIVE NEGATIVE mg/dL   Hgb urine dipstick MODERATE (A) NEGATIVE   Bilirubin Urine NEGATIVE NEGATIVE   Ketones, ur NEGATIVE NEGATIVE mg/dL   Protein, ur 30 (A) NEGATIVE mg/dL   Nitrite NEGATIVE NEGATIVE   Leukocytes, UA NEGATIVE NEGATIVE   RBC / HPF 6-30 0 - 5 RBC/hpf   WBC, UA 0-5 0 - 5 WBC/hpf   Bacteria, UA NONE SEEN NONE SEEN   Squamous Epithelial / LPF NONE SEEN NONE SEEN   Mucous PRESENT    Ct Abdomen Pelvis W Contrast  Result Date: 07/06/2016 CLINICAL DATA:  Abdominal pain, gas, diarrhea and constipation. Vomiting x1 day. History of small bowel obstruction with lysis of adhesions. EXAM: CT ABDOMEN AND PELVIS WITH CONTRAST TECHNIQUE: Multidetector CT imaging of the abdomen and pelvis was performed using the standard protocol following bolus administration of intravenous contrast. CONTRAST:  134m ISOVUE-300 IOPAMIDOL (ISOVUE-300) INJECTION 61% COMPARISON:  CT from 05/21/2015 FINDINGS: Lower chest: Nonacute Hepatobiliary: Hepatic steatosis. Cholecystectomy. No biliary dilatation. No focal mass. Pancreas: Normal Spleen: No splenomegaly or mass. Adrenals/Urinary Tract: Normal bilateral adrenal glands. Symmetric enhancement of both kidneys without obstructive uropathy, calculus or mass. Nondistended urinary bladder. Stomach/Bowel: Moderate fluid and gaseous  distention of stomach. Normal small bowel rotation. Fluid-filled moderate distention of jejunal loops with transition point seen on series 2, image 55 in the mid abdomen at the level of the umbilicus. Small bowel loops are dilated up to 2.9 cm. Ileal loops are contracted in appearance. Stool and gas noted within nonobstructed nondistended large bowel. Scattered colonic diverticulosis along the transverse colon. Appendectomy. Vascular/Lymphatic: No significant vascular findings are present. No enlarged abdominal or pelvic lymph nodes. Reproductive: Normal size prostate with central zone calcifications. Other: There is no free air free fluid.  Supraumbilical fat containing hernias appear stable, the most cephalad measuring approximately 1.6 cm transverse and two additional fat containing hernias also containing fat further caudad with the mouth of the hernias measuring 1.6 and 2.5 cm transverse respectively. Musculoskeletal: Degenerative disc disease L4-5 and L5-S1. No acute osseous abnormality. No suspicious osseous abnormalities. IMPRESSION: 1. Dilated fluid-filled jejunal loops with transition point in the mid abdomen deep to the umbilicus consistent with early or partial SBO. Findings may be secondary to adhesions. 2. Hepatic steatosis. 3. Cholecystectomy and appendectomy. Electronically Signed   By: Ashley Royalty M.D.   On: 07/06/2016 21:00    Review of Systems  Constitutional: Negative for chills, fever and weight loss.  HENT: Negative for congestion, ear pain and hearing loss.   Eyes: Negative for blurred vision, double vision and photophobia.  Respiratory: Negative for cough, hemoptysis, sputum production and shortness of breath.   Cardiovascular: Negative for chest pain, palpitations and orthopnea.  Gastrointestinal: Positive for abdominal pain, diarrhea, nausea and vomiting. Negative for constipation and heartburn.  Musculoskeletal: Negative for back pain, myalgias and neck pain.  Neurological:  Negative for dizziness, tingling, tremors and headaches.  All other systems reviewed and are negative.   Blood pressure 120/84, pulse 79, temperature 97.8 F (36.6 C), temperature source Oral, resp. rate 18, SpO2 94 %. Physical Exam  Constitutional: He is oriented to person, place, and time. He appears well-developed and well-nourished. No distress.  HENT:  Head: Normocephalic and atraumatic.  Eyes: Conjunctivae and EOM are normal. Pupils are equal, round, and reactive to light.  Neck: Normal range of motion. Neck supple. No JVD present. No tracheal deviation present. No thyromegaly present.  Cardiovascular: Normal rate, regular rhythm, normal heart sounds and intact distal pulses.  Exam reveals no gallop and no friction rub.   No murmur heard. Respiratory: Effort normal and breath sounds normal. No stridor. No respiratory distress. He has no wheezes. He has no rales. He exhibits no tenderness.  GI: Soft. Bowel sounds are normal. He exhibits distension (min). He exhibits no mass. There is no tenderness. There is no rebound and no guarding. A hernia is present. Hernia confirmed positive in the ventral area.    Musculoskeletal: Normal range of motion. He exhibits deformity. He exhibits no edema or tenderness.  Neurological: He is alert and oriented to person, place, and time.  Skin: Skin is warm and dry. He is not diaphoretic.     Assessment/Plan  55 year old male with possible partial small bowel traction.  1. This time will observe, will not place NG tube as the patient does not have any emesis over the last 8-10 hours. If nausea/vomiting returns place NG tube. 2. Already draw labs in a.m. 3. Nothing by mouth, IV hydration.  Reyes Ivan, MD 07/06/2016, 11:07 PM

## 2016-07-06 NOTE — ED Notes (Signed)
Care handoff to Joyce RN 

## 2016-07-06 NOTE — ED Notes (Signed)
Patient taken to room and placed in gown, nurse notified of patient's arrival.

## 2016-07-06 NOTE — ED Triage Notes (Signed)
Patient here with generalized abdominal pain since early am. Complains of vomiting and diarrhea with same. Here to be evaluated for possible obstruction, hx of same

## 2016-07-07 ENCOUNTER — Observation Stay (HOSPITAL_COMMUNITY): Payer: BLUE CROSS/BLUE SHIELD

## 2016-07-07 ENCOUNTER — Encounter (HOSPITAL_COMMUNITY): Payer: Self-pay

## 2016-07-07 DIAGNOSIS — Z8261 Family history of arthritis: Secondary | ICD-10-CM | POA: Diagnosis not present

## 2016-07-07 DIAGNOSIS — K219 Gastro-esophageal reflux disease without esophagitis: Secondary | ICD-10-CM | POA: Diagnosis present

## 2016-07-07 DIAGNOSIS — Z8249 Family history of ischemic heart disease and other diseases of the circulatory system: Secondary | ICD-10-CM | POA: Diagnosis not present

## 2016-07-07 DIAGNOSIS — Z91048 Other nonmedicinal substance allergy status: Secondary | ICD-10-CM | POA: Diagnosis not present

## 2016-07-07 DIAGNOSIS — E785 Hyperlipidemia, unspecified: Secondary | ICD-10-CM | POA: Diagnosis present

## 2016-07-07 DIAGNOSIS — K5669 Other partial intestinal obstruction: Secondary | ICD-10-CM | POA: Diagnosis present

## 2016-07-07 DIAGNOSIS — K449 Diaphragmatic hernia without obstruction or gangrene: Secondary | ICD-10-CM | POA: Diagnosis present

## 2016-07-07 DIAGNOSIS — R109 Unspecified abdominal pain: Secondary | ICD-10-CM | POA: Diagnosis present

## 2016-07-07 DIAGNOSIS — Z4682 Encounter for fitting and adjustment of non-vascular catheter: Secondary | ICD-10-CM | POA: Diagnosis not present

## 2016-07-07 DIAGNOSIS — K56600 Partial intestinal obstruction, unspecified as to cause: Secondary | ICD-10-CM | POA: Diagnosis not present

## 2016-07-07 DIAGNOSIS — Z23 Encounter for immunization: Secondary | ICD-10-CM | POA: Diagnosis not present

## 2016-07-07 DIAGNOSIS — Z6833 Body mass index (BMI) 33.0-33.9, adult: Secondary | ICD-10-CM | POA: Diagnosis not present

## 2016-07-07 DIAGNOSIS — Z9049 Acquired absence of other specified parts of digestive tract: Secondary | ICD-10-CM | POA: Diagnosis not present

## 2016-07-07 DIAGNOSIS — K56699 Other intestinal obstruction unspecified as to partial versus complete obstruction: Secondary | ICD-10-CM | POA: Diagnosis not present

## 2016-07-07 DIAGNOSIS — N400 Enlarged prostate without lower urinary tract symptoms: Secondary | ICD-10-CM | POA: Diagnosis present

## 2016-07-07 DIAGNOSIS — D72829 Elevated white blood cell count, unspecified: Secondary | ICD-10-CM | POA: Diagnosis present

## 2016-07-07 DIAGNOSIS — K432 Incisional hernia without obstruction or gangrene: Secondary | ICD-10-CM | POA: Diagnosis present

## 2016-07-07 DIAGNOSIS — E669 Obesity, unspecified: Secondary | ICD-10-CM | POA: Diagnosis present

## 2016-07-07 LAB — CBC WITH DIFFERENTIAL/PLATELET
Basophils Absolute: 0 10*3/uL (ref 0.0–0.1)
Basophils Relative: 0 %
EOS ABS: 0 10*3/uL (ref 0.0–0.7)
Eosinophils Relative: 1 %
HEMATOCRIT: 44.3 % (ref 39.0–52.0)
HEMOGLOBIN: 15 g/dL (ref 13.0–17.0)
LYMPHS ABS: 0.9 10*3/uL (ref 0.7–4.0)
LYMPHS PCT: 11 %
MCH: 31.3 pg (ref 26.0–34.0)
MCHC: 33.9 g/dL (ref 30.0–36.0)
MCV: 92.3 fL (ref 78.0–100.0)
Monocytes Absolute: 0.7 10*3/uL (ref 0.1–1.0)
Monocytes Relative: 8 %
NEUTROS ABS: 6.8 10*3/uL (ref 1.7–7.7)
NEUTROS PCT: 80 %
Platelets: 243 10*3/uL (ref 150–400)
RBC: 4.8 MIL/uL (ref 4.22–5.81)
RDW: 13.2 % (ref 11.5–15.5)
WBC: 8.5 10*3/uL (ref 4.0–10.5)

## 2016-07-07 LAB — BASIC METABOLIC PANEL
Anion gap: 10 (ref 5–15)
BUN: 21 mg/dL — AB (ref 6–20)
CHLORIDE: 104 mmol/L (ref 101–111)
CO2: 28 mmol/L (ref 22–32)
Calcium: 8.7 mg/dL — ABNORMAL LOW (ref 8.9–10.3)
Creatinine, Ser: 1.11 mg/dL (ref 0.61–1.24)
GFR calc Af Amer: >60 mL/min (ref >60)
GFR calc non Af Amer: >60 mL/min (ref >60)
Glucose, Bld: 148 mg/dL — ABNORMAL HIGH (ref 65–99)
POTASSIUM: 3.8 mmol/L (ref 3.5–5.1)
SODIUM: 142 mmol/L (ref 135–145)

## 2016-07-07 MED ORDER — DIATRIZOATE MEGLUMINE & SODIUM 66-10 % PO SOLN
90.0000 mL | Freq: Once | ORAL | Status: DC
  Filled 2016-07-07: qty 90

## 2016-07-07 MED ORDER — INFLUENZA VAC SPLIT QUAD 0.5 ML IM SUSY
0.5000 mL | PREFILLED_SYRINGE | INTRAMUSCULAR | Status: AC
  Administered 2016-07-08: 0.5 mL via INTRAMUSCULAR
  Filled 2016-07-07 (×2): qty 0.5

## 2016-07-07 MED ORDER — LIDOCAINE VISCOUS 2 % MT SOLN
15.0000 mL | Freq: Once | OROMUCOSAL | Status: AC
  Filled 2016-07-07: qty 15

## 2016-07-07 MED ORDER — MORPHINE SULFATE (PF) 2 MG/ML IV SOLN
1.0000 mg | INTRAVENOUS | Status: DC | PRN
  Administered 2016-07-07 – 2016-07-09 (×3): 2 mg via INTRAVENOUS
  Filled 2016-07-07 (×3): qty 1

## 2016-07-07 MED ORDER — LIDOCAINE VISCOUS 2 % MT SOLN
OROMUCOSAL | Status: AC
  Filled 2016-07-07: qty 15

## 2016-07-07 NOTE — Progress Notes (Signed)
Subjective: Still having nausea and vomited about an hour ago, onions, he had Monday.  Still distended and tender, no flatus or BM. He says this has been coming on for a couple days and he should have stopped eating a couple days ago.  Objective: Vital signs in last 24 hours: Temp:  [97.8 F (36.6 C)-98.1 F (36.7 C)] 98 F (36.7 C) (02/07 0443) Pulse Rate:  [79-110] 80 (02/07 0443) Resp:  [16-20] 18 (02/07 0443) BP: (101-130)/(67-91) 130/67 (02/07 0443) SpO2:  [91 %-98 %] 98 % (02/07 0443) Weight:  [100.6 kg (221 lb 12.5 oz)] 100.6 kg (221 lb 12.5 oz) (02/07 0111) Last BM Date: 07/06/16 681 IV recorded. Nothing recorded for output Afebrile, VSS Labs OK Film this AM:  There is gaseous distention of small bowel with loops measuring up to 4.7 cm identified. No free intraperitoneal air. Intake/Output from previous day: 02/06 0701 - 02/07 0700 In: 681.3 [I.V.:681.3] Out: -  Intake/Output this shift: No intake/output data recorded.  General appearance: alert, cooperative and mild distress GI: distedned and tender.  ongoing nausea and vomiting  Lab Results:   Recent Labs  07/06/16 1407 07/07/16 0653  WBC 15.7* 8.5  HGB 16.8 15.0  HCT 48.6 44.3  PLT 265 243    BMET  Recent Labs  07/06/16 1407 07/07/16 0653  NA 140 142  K 3.8 3.8  CL 106 104  CO2 24 28  GLUCOSE 131* 148*  BUN 14 21*  CREATININE 0.91 1.11  CALCIUM 9.4 8.7*   PT/INR No results for input(s): LABPROT, INR in the last 72 hours.   Recent Labs Lab 07/06/16 1407  AST 28  ALT 41  ALKPHOS 47  BILITOT 0.9  PROT 7.8  ALBUMIN 4.4     Lipase     Component Value Date/Time   LIPASE 25 07/06/2016 1407     Studies/Results: Ct Abdomen Pelvis W Contrast  Result Date: 07/06/2016 CLINICAL DATA:  Abdominal pain, gas, diarrhea and constipation. Vomiting x1 day. History of small bowel obstruction with lysis of adhesions. EXAM: CT ABDOMEN AND PELVIS WITH CONTRAST TECHNIQUE: Multidetector CT imaging  of the abdomen and pelvis was performed using the standard protocol following bolus administration of intravenous contrast. CONTRAST:  ISOVUE-300 IOPAMIDOL (ISOVUE-300) INJECTION 61% COMPARISON:  CT from 05/21/2015 FINDINGS: Lower chest: Nonacute Hepatobiliary: Hepatic steatosis. Cholecystectomy. No biliary dilatation. No focal mass. Pancreas: Normal Spleen: No splenomegaly or mass. Adrenals/Urinary Tract: Normal bilateral adrenal glands. Symmetric enhancement of both kidneys without obstructive uropathy, calculus or mass. Nondistended urinary bladder. Stomach/Bowel: Moderate fluid and gaseous distention of stomach. Normal small bowel rotation. Fluid-filled moderate distention of jejunal loops with transition point seen on series 2, image 55 in the mid abdomen at the level of the umbilicus. Small bowel loops are dilated up to 2.9 cm. Ileal loops are contracted in appearance. Stool and gas noted within nonobstructed nondistended large bowel. Scattered colonic diverticulosis along the transverse colon. Appendectomy. Vascular/Lymphatic: No significant vascular findings are present. No enlarged abdominal or pelvic lymph nodes. Reproductive: Normal size prostate with central zone calcifications. Other: There is no free air free fluid. Supraumbilical fat containing hernias appear stable, the most cephalad measuring approximately 1.6 cm transverse and two additional fat containing hernias also containing fat further caudad with the mouth of the hernias measuring 1.6 and 2.5 cm transverse respectively. Musculoskeletal: Degenerative disc disease L4-5 and L5-S1. No acute osseous abnormality. No suspicious osseous abnormalities. IMPRESSION: 1. Dilated fluid-filled jejunal loops with transition point in the mid abdomen deep  to the umbilicus consistent with early or partial SBO. Findings may be secondary to adhesions. 2. Hepatic steatosis. 3. Cholecystectomy and appendectomy. Electronically Signed   By: Tollie Ethavid  Kwon M.D.    On: 07/06/2016 21:00   Dg Abd 2 Views  Result Date: 07/07/2016 CLINICAL DATA:  Small bowel obstruction.  Nausea. EXAM: ABDOMEN - 2 VIEW COMPARISON:  CT abdomen and pelvis 07/06/2016. FINDINGS: There is gaseous distention of small bowel with loops measuring up to 4.7 cm identified. No free intraperitoneal air. IMPRESSION: Persistent small-bowel obstruction. Electronically Signed   By: Drusilla Kannerhomas  Dalessio M.D.   On: 07/07/2016 09:36    Medications: . enoxaparin (LOVENOX) injection  40 mg Subcutaneous QHS  . [START ON 07/08/2016] Influenza vac split quadrivalent PF  0.5 mL Intramuscular Tomorrow-1000   . dextrose 5 % and 0.9% NaCl 125 mL/hr at 07/07/16 [id number]    Assessment/Plan Recurrent SBO Prior appendectomy/cholecystectomy/LYSIS OF Adhesions 05/29/13 Dr. Magnus IvanBlackman Hospitalized 12/21-12/24/17 for SBO Hx of hiatal hernai Hx of headache GERD FEN: IV fluids/NPO ID: no abx DVT:  Lovenox   Plan:  Place NG and start suction.  SBP after NG working.    LOS: 0 days    Andrell Bergeson 07/07/2016 [phone]

## 2016-07-08 NOTE — Progress Notes (Signed)
Subjective: Pt feels much better.  Passing flatus and having Bm's  Objective: Vital signs in last 24 hours: Temp:  [98 F (36.7 C)-98.5 F (36.9 C)] 98.5 F (36.9 C) (02/08 0800) Pulse Rate:  [64-85] 64 (02/08 0800) Resp:  [18] 18 (02/08 0800) BP: (127-134)/(63-86) 127/85 (02/08 0800) SpO2:  [97 %-100 %] 97 % (02/08 0800) Last BM Date: 07/08/16 . Intake/Output from previous day: 02/07 0701 - 02/08 0700 In: 1365 [P.O.:240; I.V.:1125] Out: -  Intake/Output this shift: No intake/output data recorded.  General appearance: alert, cooperative and mild distress GI: distedned and tender.  ongoing nausea and vomiting NG output clear  Lab Results:   Recent Labs  07/06/16 1407 07/07/16 0653  WBC 15.7* 8.5  HGB 16.8 15.0  HCT 48.6 44.3  PLT 265 243    BMET  Recent Labs  07/06/16 1407 07/07/16 0653  NA 140 142  K 3.8 3.8  CL 106 104  CO2 24 28  GLUCOSE 131* 148*  BUN 14 21*  CREATININE 0.91 1.11  CALCIUM 9.4 8.7*   PT/INR No results for input(s): LABPROT, INR in the last 72 hours.   Recent Labs Lab 07/06/16 1407  AST 28  ALT 41  ALKPHOS 47  BILITOT 0.9  PROT 7.8  ALBUMIN 4.4     Lipase     Component Value Date/Time   LIPASE 25 07/06/2016 1407     Studies/Results: Dg Abd 1 View  Result Date: 07/07/2016 CLINICAL DATA:  Advancement of NG tube. EXAM: ABDOMEN - 1 VIEW COMPARISON:  None. FINDINGS: NG tube was placed under fluoroscopy by Radiology technologist Tara Dingus. The tube was left in excellent position with the side-port in the midbody of the stomach. IMPRESSION: NG tube in good position. Electronically Signed   By: Drusilla Kannerhomas  Dalessio M.D.   On: 07/07/2016 16:22   Ct Abdomen Pelvis W Contrast  Result Date: 07/06/2016 CLINICAL DATA:  Abdominal pain, gas, diarrhea and constipation. Vomiting x1 day. History of small bowel obstruction with lysis of adhesions. EXAM: CT ABDOMEN AND PELVIS WITH CONTRAST TECHNIQUE: Multidetector CT imaging of the abdomen  and pelvis was performed using the standard protocol following bolus administration of intravenous contrast. CONTRAST:  100mL ISOVUE-300 IOPAMIDOL (ISOVUE-300) INJECTION 61% COMPARISON:  CT from 05/21/2015 FINDINGS: Lower chest: Nonacute Hepatobiliary: Hepatic steatosis. Cholecystectomy. No biliary dilatation. No focal mass. Pancreas: Normal Spleen: No splenomegaly or mass. Adrenals/Urinary Tract: Normal bilateral adrenal glands. Symmetric enhancement of both kidneys without obstructive uropathy, calculus or mass. Nondistended urinary bladder. Stomach/Bowel: Moderate fluid and gaseous distention of stomach. Normal small bowel rotation. Fluid-filled moderate distention of jejunal loops with transition point seen on series 2, image 55 in the mid abdomen at the level of the umbilicus. Small bowel loops are dilated up to 2.9 cm. Ileal loops are contracted in appearance. Stool and gas noted within nonobstructed nondistended large bowel. Scattered colonic diverticulosis along the transverse colon. Appendectomy. Vascular/Lymphatic: No significant vascular findings are present. No enlarged abdominal or pelvic lymph nodes. Reproductive: Normal size prostate with central zone calcifications. Other: There is no free air free fluid. Supraumbilical fat containing hernias appear stable, the most cephalad measuring approximately 1.6 cm transverse and two additional fat containing hernias also containing fat further caudad with the mouth of the hernias measuring 1.6 and 2.5 cm transverse respectively. Musculoskeletal: Degenerative disc disease L4-5 and L5-S1. No acute osseous abnormality. No suspicious osseous abnormalities. IMPRESSION: 1. Dilated fluid-filled jejunal loops with transition point in the mid abdomen deep to the umbilicus consistent with  early or partial SBO. Findings may be secondary to adhesions. 2. Hepatic steatosis. 3. Cholecystectomy and appendectomy. Electronically Signed   By: Tollie Eth M.D.   On: 07/06/2016  21:00   Dg Abd 2 Views  Result Date: 07/07/2016 CLINICAL DATA:  Small bowel obstruction.  Nausea. EXAM: ABDOMEN - 2 VIEW COMPARISON:  CT abdomen and pelvis 07/06/2016. FINDINGS: There is gaseous distention of small bowel with loops measuring up to 4.7 cm identified. No free intraperitoneal air. IMPRESSION: Persistent small-bowel obstruction. Electronically Signed   By: Drusilla Kanner M.D.   On: 07/07/2016 09:36    Medications: . diatrizoate meglumine-sodium  90 mL Per NG tube Once  . enoxaparin (LOVENOX) injection  40 mg Subcutaneous QHS  . Influenza vac split quadrivalent PF  0.5 mL Intramuscular Tomorrow-1000   . dextrose 5 % and 0.9% NaCl 125 mL/hr at 07/08/16 0203    Assessment/Plan Recurrent SBO Prior appendectomy/cholecystectomy/LYSIS OF Adhesions 05/29/13 Dr. Magnus Ivan Hospitalized 12/21-12/24/17 for SBO Hx of hiatal hernai Hx of headache GERD FEN: clears ID: no abx DVT:  Lovenox   Plan:  Having bowel function.  D/c NG and start clears    LOS: 1 day    Alphia Behanna C. 07/08/2016 [phone]

## 2016-07-08 NOTE — Progress Notes (Signed)
NG tube removed from left nare without difficulty by Student Nurse with Instructor supervision Pt tolerated removal well, no difficulty.

## 2016-07-09 ENCOUNTER — Inpatient Hospital Stay (HOSPITAL_COMMUNITY): Payer: BLUE CROSS/BLUE SHIELD

## 2016-07-09 NOTE — Progress Notes (Signed)
Patient ID: Jonathan Burke, male   DOB: 10/09/1960, 56 y.o.   MRN: [id number]     Subjective: Feels much better. He had several bowel movements yesterday and continuing to pass flatus. Tolerating clear liquids. Denies nausea. He has a tender lump in his epigastrium which has been a little more swollen and tender since admission.  Objective: Vital signs in last 24 hours: Temp:  [98.3 F (36.8 C)-98.6 F (37 C)] 98.4 F (36.9 C) (02/09 0504) Pulse Rate:  [65-70] 65 (02/09 0504) Resp:  [16] 16 (02/08 1342) BP: (109-134)/(75-81) 109/75 (02/09 0504) SpO2:  [96 %-99 %] 96 % (02/09 0504) Last BM Date: 07/08/16  Intake/Output from previous day: 02/08 0701 - 02/09 0700 In: 240 [P.O.:240] Out: 350 [Emesis/NG output:350] Intake/Output this shift: No intake/output data recorded.  General appearance: alert, cooperative and no distress GI: Minimal if any distention. Healed small midline incision. Above this in the epigastrium is an approximately 6 cm mildly tender nonreducible mass but no erythema.  Lab Results:   Recent Labs  07/06/16 1407 07/07/16 0653  WBC 15.7* 8.5  HGB 16.8 15.0  HCT 48.6 44.3  PLT 265 243   BMET  Recent Labs  07/06/16 1407 07/07/16 0653  NA 140 142  K 3.8 3.8  CL 106 104  CO2 24 28  GLUCOSE 131* 148*  BUN 14 21*  CREATININE 0.91 1.11  CALCIUM 9.4 8.7*     Studies/Results: Dg Abd 1 View  Result Date: 07/07/2016 CLINICAL DATA:  Advancement of NG tube. EXAM: ABDOMEN - 1 VIEW COMPARISON:  None. FINDINGS: NG tube was placed under fluoroscopy by Radiology technologist Tara Dingus. The tube was left in excellent position with the side-port in the midbody of the stomach. IMPRESSION: NG tube in good position. Electronically Signed   By: Drusilla Kannerhomas  Dalessio M.D.   On: 07/07/2016 16:22   Dg Abd 2 Views  Result Date: 07/07/2016 CLINICAL DATA:  Small bowel obstruction.  Nausea. EXAM: ABDOMEN - 2 VIEW COMPARISON:  CT abdomen and pelvis 07/06/2016. FINDINGS: There is  gaseous distention of small bowel with loops measuring up to 4.7 cm identified. No free intraperitoneal air. IMPRESSION: Persistent small-bowel obstruction. Electronically Signed   By: Drusilla Kannerhomas  Dalessio M.D.   On: 07/07/2016 09:36    Anti-infectives: Anti-infectives    None      Assessment/Plan: Small bowel obstruction likely secondary to adhesions. Clinically this seems to be resolving. His last x-ray showed continued small bowel dilatation. I'm going to repeat plain films today. If they are okay I think we can advance diet and probably discharge later today. He has a chronically incarcerated epigastric ventral hernia but this contained only fat on CT scan and I do not think has anything to do with his small bowel obstruction and could be repaired electively.    LOS: 2 days    Ghadeer Kastelic T 07/09/2016

## 2016-07-10 NOTE — Discharge Summary (Signed)
Physician Discharge Summary  Patient ID: Jonathan Burke MRN: [id number] DOB/AGE: 56/09/1960 56 y.o.  Admit date: 07/06/2016 Discharge date: 07/10/2016  Admission Diagnoses:PSBO  Discharge Diagnoses:  Active Problems:   SBO (small bowel obstruction)  INCISIONAL HERNIA  Discharged Condition: good  Hospital Course: Pt admitted for bowel rest and NGT.  He responded within 24 hour of NGT placement and obstruction.  He has  An incisional hernia that has fat only on CT I recommended [place] [facility] has [facility] . He was tolerating his diet  And discharged.      Treatments: IV hydration and NGT   Discharge Exam: Blood pressure 132/81, pulse 70, temperature 98.2 F (36.8 C), temperature source Oral, resp. rate 18, height 5\' 8"  (1.727 m), weight 100.6 kg (221 lb 12.5 oz), SpO2 95 %. GI: reducible incisional hernia soft NT SCAR NOTED   Disposition: 01-Home or Self Care  Discharge Instructions    Diet general    Complete by:  As directed    Soft diet   Increase activity slowly    Complete by:  As directed      Allergies as of 07/10/2016      Reactions   Adhesive [tape] Other (See Comments)   PULLS SKIN OFF. Paper tape only      Medication List    TAKE these medications   metoCLOPramide 10 MG tablet Commonly known as:  REGLAN Take 1 tablet (10 mg total) by mouth 3 (three) times daily before meals.   ondansetron 4 MG tablet Commonly known as:  ZOFRAN Take 1 tablet (4 mg total) by mouth every 8 (eight) hours as needed for nausea or vomiting.   pantoprazole 40 MG tablet Commonly known as:  PROTONIX Take 1 tablet (40 mg total) by mouth daily.   tadalafil 20 MG tablet Commonly known as:  CIALIS Take 0.5-1 tablets (10-20 mg total) by mouth every other day as needed for erectile dysfunction.   Vitamin D 2000 units Caps Take 4,000 Units by mouth daily.        Signed: Dacota Devall A. 07/10/2016, 10:08 AM

## 2016-07-10 NOTE — Progress Notes (Signed)
Pt discharged to home with wife.  Pt and wife verbalized understanding of discharge instructions and follow up care.  All belongings sent home with pt.  Education provided re: diet, activity, when to call MD and follow up care.   Sundra AlandMaura S Tamyrah Burbage, RN

## 2016-07-10 NOTE — Progress Notes (Signed)
  Subjective: Doing well tolerating his diet Films much improved   Objective: Vital signs in last 24 hours: Temp:  [98 F (36.7 C)-98.4 F (36.9 C)] 98.2 F (36.8 C) (02/10 0543) Pulse Rate:  [70-76] 70 (02/10 0543) Resp:  [16-22] 18 (02/10 0543) BP: (132-142)/(81-88) 132/81 (02/10 0543) SpO2:  [95 %-97 %] 95 % (02/10 0543) Last BM Date: 07/08/16  Intake/Output from previous day: 02/09 0701 - 02/10 0700 In: 480 [P.O.:480] Out: 3 [Urine:3] Intake/Output this shift: Total I/O In: 360 [P.O.:360] Out: -   GI: soft mild distention  small epigastric hernia with fat   Lab Results:  No results for input(s): WBC, HGB, HCT, PLT in the last 72 hours. BMET No results for input(s): NA, K, CL, CO2, GLUCOSE, BUN, CREATININE, CALCIUM in the last 72 hours. PT/INR No results for input(s): LABPROT, INR in the last 72 hours. ABG No results for input(s): PHART, HCO3 in the last 72 hours.  Invalid input(s): PCO2, PO2  Studies/Results: Dg Abd 2 Views  Result Date: 07/09/2016 CLINICAL DATA:  Small bowel obstruction. Hx of cholecystectomy, appendectomy, enlarged prostate, GERD, hiatal hernia. EXAM: ABDOMEN - 2 VIEW COMPARISON:  None. FINDINGS: Bowel gas pattern is nonobstructive. Several nonspecific air-fluid levels within the right upper abdomen. Suspect small bowel wall thickening within the central abdomen and right lower abdomen. No evidence of soft tissue mass or abnormal fluid collection. No evidence of free intraperitoneal air. No acute or suspicious osseous finding. Mild scoliosis of the lumbar spine. Cholecystectomy clips in the right upper quadrant. IMPRESSION: 1. Nonobstructive bowel gas pattern. 2. Suspected thickening of the walls of the small bowel within the central abdomen and right lower abdomen, raising the possibility of an infectious or inflammatory enteritis. Several nonspecific air-fluid levels are seen within the right upper abdomen which can also be an indication of  underlying enteritis. Electronically Signed   By: Bary RichardStan  Maynard M.D.   On: 07/09/2016 09:06    Anti-infectives: Anti-infectives    None      Assessment/Plan: Patient Active Problem List   Diagnosis Date Noted  . Partial small bowel obstruction 05/21/2015  . Dyslipidemia (high LDL; low HDL) 09/05/2012  . BPH (benign prostatic hyperplasia) 09/05/2012  . SBO (small bowel obstruction) 07/17/2012  . GERD (gastroesophageal reflux disease) 07/17/2012  . Obesity (BMI 30-39.9) 07/17/2012   Small incisional hernia    Doing well Discharge on soft diet  Can follow up as outpatient for  Incisional hernia repair    LOS: 3 days    Sanika Brosious A. 07/10/2016

## 2016-07-10 NOTE — Discharge Instructions (Signed)
Small Bowel Obstruction A small bowel obstruction means that something is blocking the small bowel. The small bowel is also called the small intestine. It is the long tube that connects the stomach to the colon. An obstruction will stop food and fluids from passing through the small bowel. Treatment depends on what is causing the problem and how bad the problem is. Follow these instructions at home:  Get a lot of rest.  Follow your diet as told by your doctor. You may need to:  Only drink clear liquids until you start to get better.  Avoid solid foods as told by your doctor.  Take over-the-counter and prescription medicines only as told by your doctor.  Keep all follow-up visits as told by your doctor. This is important. Contact a doctor if:  You have a fever.  You have chills. Get help right away if:  You have pain or cramps that get worse.  You throw up (vomit) blood.  You have a feeling of being sick to your stomach (nausea) that does not go away.  You cannot stop throwing up.  You cannot drink fluids.  You feel confused.  You feel dry or thirsty (dehydrated).  Your belly gets more bloated.  You feel weak or you pass out (faint). This information is not intended to replace advice given to you by your health care provider. Make sure you discuss any questions you have with your health care provider. Document Released: 06/24/2004 Document Revised: 01/12/2016 Document Reviewed: 07/11/2014 Elsevier Interactive Patient Education  2017 [employer]      Can follow up at [facility] or [facility] as you desire to consider repair of incisional hernia

## 2016-10-26 DIAGNOSIS — Z8719 Personal history of other diseases of the digestive system: Secondary | ICD-10-CM | POA: Diagnosis not present

## 2016-10-26 DIAGNOSIS — Z1211 Encounter for screening for malignant neoplasm of colon: Secondary | ICD-10-CM | POA: Diagnosis not present

## 2016-10-26 DIAGNOSIS — Z01818 Encounter for other preprocedural examination: Secondary | ICD-10-CM | POA: Diagnosis not present

## 2016-10-26 DIAGNOSIS — K625 Hemorrhage of anus and rectum: Secondary | ICD-10-CM | POA: Diagnosis not present

## 2016-11-19 DIAGNOSIS — D123 Benign neoplasm of transverse colon: Secondary | ICD-10-CM | POA: Diagnosis not present

## 2016-11-19 DIAGNOSIS — K625 Hemorrhage of anus and rectum: Secondary | ICD-10-CM | POA: Diagnosis not present

## 2016-11-19 DIAGNOSIS — Z79899 Other long term (current) drug therapy: Secondary | ICD-10-CM | POA: Diagnosis not present

## 2016-11-23 DIAGNOSIS — D123 Benign neoplasm of transverse colon: Secondary | ICD-10-CM | POA: Diagnosis not present

## 2017-09-09 DIAGNOSIS — M170 Bilateral primary osteoarthritis of knee: Secondary | ICD-10-CM | POA: Diagnosis not present

## 2017-09-09 DIAGNOSIS — M1712 Unilateral primary osteoarthritis, left knee: Secondary | ICD-10-CM | POA: Diagnosis not present

## 2017-09-15 DIAGNOSIS — J40 Bronchitis, not specified as acute or chronic: Secondary | ICD-10-CM | POA: Diagnosis not present

## 2017-09-15 DIAGNOSIS — R05 Cough: Secondary | ICD-10-CM | POA: Diagnosis not present

## 2017-09-15 DIAGNOSIS — J029 Acute pharyngitis, unspecified: Secondary | ICD-10-CM | POA: Diagnosis not present

## 2017-09-15 DIAGNOSIS — Z6834 Body mass index (BMI) 34.0-34.9, adult: Secondary | ICD-10-CM | POA: Diagnosis not present

## 2018-01-09 DIAGNOSIS — H903 Sensorineural hearing loss, bilateral: Secondary | ICD-10-CM | POA: Diagnosis not present

## 2018-01-16 DIAGNOSIS — H9313 Tinnitus, bilateral: Secondary | ICD-10-CM | POA: Diagnosis not present

## 2018-01-16 DIAGNOSIS — G44219 Episodic tension-type headache, not intractable: Secondary | ICD-10-CM | POA: Diagnosis not present

## 2018-01-16 DIAGNOSIS — H903 Sensorineural hearing loss, bilateral: Secondary | ICD-10-CM | POA: Diagnosis not present

## 2018-01-18 ENCOUNTER — Other Ambulatory Visit: Payer: Self-pay | Admitting: Otolaryngology

## 2018-01-18 DIAGNOSIS — G44219 Episodic tension-type headache, not intractable: Secondary | ICD-10-CM

## 2018-01-18 DIAGNOSIS — H903 Sensorineural hearing loss, bilateral: Secondary | ICD-10-CM

## 2018-01-18 DIAGNOSIS — H9313 Tinnitus, bilateral: Secondary | ICD-10-CM

## 2018-02-09 ENCOUNTER — Ambulatory Visit
Admission: RE | Admit: 2018-02-09 | Discharge: 2018-02-09 | Disposition: A | Discharge status: Home / Self Care | Payer: BLUE CROSS/BLUE SHIELD | Source: Ambulatory Visit | Attending: Otolaryngology | Admitting: Otolaryngology

## 2018-02-09 DIAGNOSIS — G44219 Episodic tension-type headache, not intractable: Secondary | ICD-10-CM

## 2018-02-09 DIAGNOSIS — H9313 Tinnitus, bilateral: Secondary | ICD-10-CM

## 2018-02-09 DIAGNOSIS — H903 Sensorineural hearing loss, bilateral: Secondary | ICD-10-CM | POA: Diagnosis not present

## 2018-02-09 MED ORDER — GADOBENATE DIMEGLUMINE 529 MG/ML IV SOLN
20.0000 mL | Freq: Once | INTRAVENOUS | Status: AC | PRN
  Administered 2018-02-09: 20 mL via INTRAVENOUS

## 2018-04-06 DIAGNOSIS — L02821 Furuncle of head [any part, except face]: Secondary | ICD-10-CM | POA: Diagnosis not present

## 2018-04-06 DIAGNOSIS — B9689 Other specified bacterial agents as the cause of diseases classified elsewhere: Secondary | ICD-10-CM | POA: Diagnosis not present

## 2018-04-13 ENCOUNTER — Telehealth: Payer: Self-pay | Admitting: Neurology

## 2018-04-13 ENCOUNTER — Encounter: Payer: Self-pay | Admitting: Neurology

## 2018-04-13 ENCOUNTER — Ambulatory Visit: Payer: BLUE CROSS/BLUE SHIELD | Admitting: Neurology

## 2018-04-13 VITALS — BP 149/94 | HR 71 | Ht 68.0 in | Wt 240.0 lb

## 2018-04-13 DIAGNOSIS — G4730 Sleep apnea, unspecified: Secondary | ICD-10-CM | POA: Diagnosis not present

## 2018-04-13 DIAGNOSIS — E669 Obesity, unspecified: Secondary | ICD-10-CM

## 2018-04-13 DIAGNOSIS — R519 Headache, unspecified: Secondary | ICD-10-CM

## 2018-04-13 DIAGNOSIS — G8929 Other chronic pain: Secondary | ICD-10-CM

## 2018-04-13 DIAGNOSIS — R400 Somnolence: Secondary | ICD-10-CM

## 2018-04-13 DIAGNOSIS — R51 Headache: Secondary | ICD-10-CM | POA: Diagnosis not present

## 2018-04-13 DIAGNOSIS — G4733 Obstructive sleep apnea (adult) (pediatric): Secondary | ICD-10-CM

## 2018-04-13 DIAGNOSIS — H93A9 Pulsatile tinnitus, unspecified ear: Secondary | ICD-10-CM

## 2018-04-13 DIAGNOSIS — R5382 Chronic fatigue, unspecified: Secondary | ICD-10-CM | POA: Diagnosis not present

## 2018-04-13 DIAGNOSIS — H9319 Tinnitus, unspecified ear: Secondary | ICD-10-CM | POA: Insufficient documentation

## 2018-04-13 DIAGNOSIS — Z8249 Family history of ischemic heart disease and other diseases of the circulatory system: Secondary | ICD-10-CM

## 2018-04-13 DIAGNOSIS — H9313 Tinnitus, bilateral: Secondary | ICD-10-CM

## 2018-04-13 DIAGNOSIS — R0683 Snoring: Secondary | ICD-10-CM

## 2018-04-13 NOTE — Telephone Encounter (Signed)
[employer] Auth: [id number] (exp. 04/13/18 to 05/12/18) order sent to [facility] for pt to be aware. I gave him GI phone number of [phone] and to give them a call if he has not heard in the next 2-3 business days.

## 2018-04-13 NOTE — Patient Instructions (Signed)
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
## 2018-04-14 ENCOUNTER — Telehealth: Payer: Self-pay | Admitting: *Deleted

## 2018-04-14 LAB — CBC
HEMOGLOBIN: 15.1 g/dL (ref 13.0–17.7)
Hematocrit: 45 % (ref 37.5–51.0)
MCH: 30 pg (ref 26.6–33.0)
MCHC: 33.6 g/dL (ref 31.5–35.7)
MCV: 90 fL (ref 79–97)
PLATELETS: 279 10*3/uL (ref 150–450)
RBC: 5.03 x10E6/uL (ref 4.14–5.80)
RDW: 12.3 % (ref 12.3–15.4)
WBC: 7.4 10*3/uL (ref 3.4–10.8)

## 2018-04-14 LAB — HEPATIC FUNCTION PANEL
ALK PHOS: 44 IU/L (ref 39–117)
ALT: 45 IU/L — AB (ref 0–44)
AST: 30 IU/L (ref 0–40)
Albumin: 4.4 g/dL (ref 3.5–5.5)
BILIRUBIN TOTAL: 0.5 mg/dL (ref 0.0–1.2)
BILIRUBIN, DIRECT: 0.16 mg/dL (ref 0.00–0.40)
Total Protein: 7.4 g/dL (ref 6.0–8.5)

## 2018-04-14 LAB — BASIC METABOLIC PANEL
BUN/Creatinine Ratio: 20 (ref 9–20)
BUN: 17 mg/dL (ref 6–24)
CALCIUM: 9.6 mg/dL (ref 8.7–10.2)
CO2: 24 mmol/L (ref 20–29)
CREATININE: 0.85 mg/dL (ref 0.76–1.27)
Chloride: 103 mmol/L (ref 96–106)
GFR calc Af Amer: 112 mL/min/{1.73_m2} (ref >59)
GFR, EST NON AFRICAN AMERICAN: 97 mL/min/{1.73_m2} (ref >59)
GLUCOSE: 97 mg/dL (ref 65–99)
Potassium: 4.6 mmol/L (ref 3.5–5.2)
SODIUM: 142 mmol/L (ref 134–144)

## 2018-04-14 LAB — C-REACTIVE PROTEIN: CRP: 7 mg/L (ref 0–10)

## 2018-04-14 LAB — SEDIMENTATION RATE: Sed Rate: 17 mm/hr (ref 0–30)

## 2018-04-14 LAB — TSH: TSH: 1.76 u[IU]/mL (ref 0.450–4.500)

## 2018-04-14 NOTE — Telephone Encounter (Signed)
-----  Message from Melvenia Beam, MD sent at 04/14/2018  8:09 AM EST ----- Labs look great, ESR is normal! thanks

## 2018-04-14 NOTE — Telephone Encounter (Signed)
Called pt and LVM asking for call back. Left office number and hours in message. When he calls back, please let him know that Dr. Jaynee Eagles said his labs look great, and his ESR is normal.

## 2018-04-14 NOTE — Telephone Encounter (Signed)
Pt called back aware that labs were normal

## 2018-04-14 NOTE — Telephone Encounter (Signed)
noted thank you

## 2018-05-08 ENCOUNTER — Ambulatory Visit
Admission: RE | Admit: 2018-05-08 | Discharge: 2018-05-08 | Disposition: A | Discharge status: Home / Self Care | Payer: BLUE CROSS/BLUE SHIELD | Source: Ambulatory Visit | Attending: Neurology | Admitting: Neurology

## 2018-05-08 DIAGNOSIS — R51 Headache: Secondary | ICD-10-CM | POA: Diagnosis not present

## 2018-05-08 DIAGNOSIS — H93A9 Pulsatile tinnitus, unspecified ear: Secondary | ICD-10-CM

## 2018-05-08 DIAGNOSIS — Z8249 Family history of ischemic heart disease and other diseases of the circulatory system: Secondary | ICD-10-CM

## 2018-05-08 DIAGNOSIS — R42 Dizziness and giddiness: Secondary | ICD-10-CM | POA: Diagnosis not present

## 2018-05-08 DIAGNOSIS — R519 Headache, unspecified: Secondary | ICD-10-CM

## 2018-05-08 MED ORDER — IOPAMIDOL (ISOVUE-370) INJECTION 76%
75.0000 mL | Freq: Once | INTRAVENOUS | Status: AC | PRN
  Administered 2018-05-08: 75 mL via INTRAVENOUS

## 2018-05-15 ENCOUNTER — Telehealth: Payer: Self-pay | Admitting: Neurology

## 2018-05-15 NOTE — Telephone Encounter (Signed)
Spoke with pt's wife Rene KocherRegina and reviewed CT-A results from Dr. Lucia GaskinsAhern. Gave information located below in detail. Her questions were answered. She verbalized understanding & appreciation. She stated that patient does not have a PCP right now but they will find one to see. She will call back to let us know who so results can be forwarded. She is aware that the patient will have to sign a release form in order for the results to be forwarded to PCP.   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 I would follow up with primary care about this in the next 3-6 months to discuss. No intervention but need to keep an eye on it with yearly imaging by her pcp. Please forward results to pcp thanks and discuss with patient. Otherwise her blood vessels look good thanks

## 2018-05-15 NOTE — Telephone Encounter (Signed)
Pts wife Rene [place]) requesting a call to discuss CT results please advise.

## 2018-05-30 DIAGNOSIS — H524 Presbyopia: Secondary | ICD-10-CM | POA: Diagnosis not present

## 2018-05-31 HISTORY — PX: REPLACEMENT TOTAL KNEE: SUR1224

## 2018-06-08 ENCOUNTER — Telehealth: Payer: Self-pay | Admitting: Neurology

## 2018-06-08 NOTE — Telephone Encounter (Signed)
Called the patient to make him aware that Dr Vickey Huger had an opening on Monday and we could move his apt up to get a start on the process to get him set up with sleep study. Pt verbalized understanding. Accepted the apt 9:30 with check in of 9 am

## 2018-06-08 NOTE — Telephone Encounter (Signed)
Pts wife states headaches are getting worse. She would like to know if her husband can come in soon to discuss options. Please advise.

## 2018-06-12 ENCOUNTER — Encounter: Payer: Self-pay | Admitting: Neurology

## 2018-06-12 ENCOUNTER — Ambulatory Visit: Payer: BLUE CROSS/BLUE SHIELD | Admitting: Neurology

## 2018-06-12 VITALS — BP 139/88 | HR 65 | Ht 68.0 in | Wt 240.0 lb

## 2018-06-12 DIAGNOSIS — H93A9 Pulsatile tinnitus, unspecified ear: Secondary | ICD-10-CM | POA: Diagnosis not present

## 2018-06-12 DIAGNOSIS — G4730 Sleep apnea, unspecified: Secondary | ICD-10-CM

## 2018-06-12 DIAGNOSIS — R51 Headache: Secondary | ICD-10-CM

## 2018-06-12 DIAGNOSIS — G4719 Other hypersomnia: Secondary | ICD-10-CM

## 2018-06-12 DIAGNOSIS — R519 Headache, unspecified: Secondary | ICD-10-CM

## 2018-06-12 DIAGNOSIS — R0683 Snoring: Secondary | ICD-10-CM
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
## 2018-06-20 ENCOUNTER — Institutional Professional Consult (permissible substitution): Payer: BLUE CROSS/BLUE SHIELD | Admitting: Neurology

## 2018-06-22 DIAGNOSIS — Z1159 Encounter for screening for other viral diseases: Secondary | ICD-10-CM | POA: Diagnosis not present

## 2018-06-22 DIAGNOSIS — M2560 Stiffness of unspecified joint, not elsewhere classified: Secondary | ICD-10-CM | POA: Diagnosis not present

## 2018-06-22 DIAGNOSIS — R5383 Other fatigue: Secondary | ICD-10-CM | POA: Diagnosis not present

## 2018-06-22 DIAGNOSIS — K219 Gastro-esophageal reflux disease without esophagitis: Secondary | ICD-10-CM | POA: Diagnosis not present

## 2018-06-22 DIAGNOSIS — G4762 Sleep related leg cramps: Secondary | ICD-10-CM | POA: Diagnosis not present

## 2018-06-22 DIAGNOSIS — M2550 Pain in unspecified joint: Secondary | ICD-10-CM | POA: Diagnosis not present

## 2018-06-22 DIAGNOSIS — I77810 Thoracic aortic ectasia: Secondary | ICD-10-CM | POA: Diagnosis not present

## 2018-06-22 DIAGNOSIS — Z125 Encounter for screening for malignant neoplasm of prostate: Secondary | ICD-10-CM | POA: Diagnosis not present

## 2018-06-30 ENCOUNTER — Other Ambulatory Visit: Payer: Self-pay | Admitting: Cardiology

## 2018-06-30 DIAGNOSIS — I77810 Thoracic aortic ectasia: Secondary | ICD-10-CM

## 2018-06-30 DIAGNOSIS — R0609 Other forms of dyspnea: Secondary | ICD-10-CM | POA: Diagnosis not present

## 2018-06-30 DIAGNOSIS — I10 Essential (primary) hypertension: Secondary | ICD-10-CM | POA: Diagnosis not present

## 2018-06-30 DIAGNOSIS — R0789 Other chest pain: Secondary | ICD-10-CM | POA: Diagnosis not present

## 2018-07-03 ENCOUNTER — Ambulatory Visit: Payer: BLUE CROSS/BLUE SHIELD | Admitting: Neurology

## 2018-07-03 DIAGNOSIS — H93A9 Pulsatile tinnitus, unspecified ear: Secondary | ICD-10-CM

## 2018-07-03 DIAGNOSIS — R0683 Snoring: Secondary | ICD-10-CM

## 2018-07-03 DIAGNOSIS — G4730 Sleep apnea, unspecified: Secondary | ICD-10-CM

## 2018-07-03 DIAGNOSIS — R519 Headache, unspecified: Secondary | ICD-10-CM

## 2018-07-03 DIAGNOSIS — G4719 Other hypersomnia: Secondary | ICD-10-CM

## 2018-07-03 DIAGNOSIS — G4733 Obstructive sleep apnea (adult) (pediatric): Secondary | ICD-10-CM | POA: Diagnosis not present

## 2018-07-03 DIAGNOSIS — R51 Headache: Principal | ICD-10-CM

## 2018-07-07 ENCOUNTER — Ambulatory Visit
Admission: RE | Admit: 2018-07-07 | Discharge: 2018-07-07 | Disposition: A | Discharge status: Home / Self Care | Payer: BLUE CROSS/BLUE SHIELD | Source: Ambulatory Visit | Attending: Cardiology | Admitting: Cardiology

## 2018-07-07 ENCOUNTER — Other Ambulatory Visit (HOSPITAL_COMMUNITY): Payer: Self-pay | Admitting: Cardiology

## 2018-07-07 DIAGNOSIS — I77810 Thoracic aortic ectasia: Secondary | ICD-10-CM

## 2018-07-07 DIAGNOSIS — I712 Thoracic aortic aneurysm, without rupture: Secondary | ICD-10-CM | POA: Diagnosis not present

## 2018-07-07 DIAGNOSIS — R079 Chest pain, unspecified: Secondary | ICD-10-CM

## 2018-07-07 DIAGNOSIS — R0602 Shortness of breath: Secondary | ICD-10-CM

## 2018-07-07 MED ORDER — IOPAMIDOL (ISOVUE-370) INJECTION 76%
75.0000 mL | Freq: Once | INTRAVENOUS | Status: AC | PRN
  Administered 2018-07-07: 75 mL via INTRAVENOUS

## 2018-07-13 ENCOUNTER — Ambulatory Visit (INDEPENDENT_AMBULATORY_CARE_PROVIDER_SITE_OTHER): Payer: BLUE CROSS/BLUE SHIELD | Admitting: Cardiology

## 2018-07-13 DIAGNOSIS — R0602 Shortness of breath: Secondary | ICD-10-CM | POA: Diagnosis not present

## 2018-07-13 DIAGNOSIS — R079 Chest pain, unspecified: Secondary | ICD-10-CM | POA: Diagnosis not present

## 2018-07-14 DIAGNOSIS — R51 Headache: Principal | ICD-10-CM

## 2018-07-14 DIAGNOSIS — H93A9 Pulsatile tinnitus, unspecified ear: Secondary | ICD-10-CM | POA: Insufficient documentation

## 2018-07-14 DIAGNOSIS — R519 Headache, unspecified: Secondary | ICD-10-CM | POA: Insufficient documentation

## 2018-07-14 DIAGNOSIS — R0683 Snoring: Secondary | ICD-10-CM | POA: Insufficient documentation

## 2018-07-14 DIAGNOSIS — G4719 Other hypersomnia: Secondary | ICD-10-CM | POA: Insufficient documentation

## 2018-07-14 DIAGNOSIS — G4730 Sleep apnea, unspecified: Secondary | ICD-10-CM | POA: Insufficient documentation

## 2018-07-14 NOTE — Procedures (Signed)
NAME:  [name]                                                                              DOB: Dec 10, 1960 MEDICAL RECORD No:  [id number]                                                            DOS: 07/03/2018  REFERRING PHYSICIAN: [name], MD STUDY PERFORMED: Home Sleep Test on Watch Pat HISTORY: Jonathan Burke is a 58 y.o. male patient of Dr. [name] who had been evaluated for hearing loss, tinnitus, and abnormal results of an aortic angiography. He was referred by Dr. [name] for headache evaluation. He is seen here on 13-05-2018 in a referral by Dr. [name] for a sleep study. The patient's wife is reporting that her husband snores loudly while he is unaware. His wife is providing all information. He has been having headaches. His blood pressure has been elevated. He wakes with them in the morning. He snores. Feels pressure in the temples. Exhausted during the day and drinks lots of coffee. Snores "terribly" or "thunderous". He drinks "monster" drinks. He falls asleep all the time. His memory is impaired. She has witnessed apneic events, he snores heavily then stops. Wife can't sleep with him. He does not feel that his sleep is impaired, he does however agree that his sleep is no longer restorative and refreshing.  He wakes up a lot during the night. PMHx: Bowel obstruction, headache, Carpal tunnel syndrome, GERD, obesity, bilateral tinnitus, sensorineural hearing loss bilateral. Epworth score 16/ 24 points, Fatigue severity score 49/63 points. BMI: 36.4 kg/m2.  STUDY RESULTS:  Total Recording Time:  7 h 52 mins; Calculated Sleep Time:  6 h 28 mins Total Apnea/Hypopnea Index (AHI):  19.6 /h; RDI: 21.1 /h; REM AHI:  32.3/h. Average Oxygen Saturation:  93 %; Lowest Oxygen Desaturation: 86 %.  Total Time in Oxygen Saturation below 89 %: 0.2 minutes.  Average Heart Rate: 74 bpm (between 37 and 107 bpm). IMPRESSION: Moderate degree of Obstructive Sleep Apnea, moderate- severe snoring, REM  exacerbated AHI from 19.6 to 32.3/h. While no prolonged hypoxemia was noted, there was a significant variability in heart rate.  RECOMMENDATION: I prefer to use CPAP in the treatment of REM dependent sleep apnea. I ordered CPAP autotitration device, pressure window from 5 through 16 cm water, 3 cm EPR and mask of patient's choice.  PCP : Please advise in weight loss and sleep hygiene implementation.   I certify that I have reviewed the raw data recording prior to the issuance of this report in accordance with the standards of the American Academy of Sleep Medicine (AASM). Melvyn Novas, M.D.   07-14-2018

## 2018-07-14 NOTE — Addendum Note (Signed)
Addended by: Melvyn Novas on: 07/14/2018 01:41 PM   Modules accepted: Orders

## 2018-07-17 ENCOUNTER — Telehealth: Payer: Self-pay | Admitting: Neurology

## 2018-07-17 ENCOUNTER — Other Ambulatory Visit: Payer: Self-pay | Admitting: Cardiology

## 2018-07-17 DIAGNOSIS — R0602 Shortness of breath: Secondary | ICD-10-CM

## 2018-07-17 NOTE — Telephone Encounter (Signed)
-----   Message from Melvyn Novas, MD sent at 07/14/2018  1:41 PM EST ----- IMPRESSION: Moderate degree of Obstructive Sleep Apnea, moderate-  severe snoring, REM exacerbated AHI from 19.6 to 32.3/h. While no prolonged hypoxemia was noted, there was a significant  variability in heart rate.  RECOMMENDATION: I prefer to use CPAP in the treatment of REM  dependent sleep apnea. I ordered CPAP autotitration device,  pressure window from 5 through 16 cm water, 3 cm EPR, heated humidity, and mask of  patient's choice.   PCP Dr. [name] : Please advise in weight loss and sleep  hygiene implementation.

## 2018-07-17 NOTE — Telephone Encounter (Signed)
I called pt. I advised pt that Dr. Vickey Huger reviewed their sleep study results and found that pt has sleep apnea. Dr. Vickey Huger recommends that pt starts auto CPAP 5-16 cm water pressure. I reviewed PAP compliance expectations with the pt. Pt is agreeable to starting a CPAP. I advised pt that an order will be sent to a DME, [facility], and [facility] will call the pt within about one week after they file with the pt's insurance. [facility] will show the pt how to use the machine, fit for masks, and troubleshoot the CPAP if needed. A follow up appt was made for insurance purposes with Elder Love, NP on April 24,2020 at 8:00 am. Pt verbalized understanding to arrive 15 minutes early and bring their CPAP. A letter with all of this information in it will be mailed to the pt as a reminder. I verified with the pt that the address we have on file is correct. Pt verbalized understanding of results. Pt had no questions at this time but was encouraged to call back if questions arise. I have sent the order to [facility] and have received confirmation that they have received the order.

## 2018-07-18 ENCOUNTER — Telehealth: Payer: Self-pay

## 2018-07-18 NOTE — Telephone Encounter (Signed)
Pt's wife called stating her husband had been prescribed a BP medication his last visit, saying her husband's legs have begun to hurt and Pt stated its to the point he doesn't want to walk. Pt's wife was wondering if there is a different medication the Pt can try that does not have these side effects.  Please Advise.

## 2018-07-19 ENCOUNTER — Other Ambulatory Visit (HOSPITAL_COMMUNITY): Payer: Self-pay | Admitting: Cardiology

## 2018-07-19 DIAGNOSIS — R0789 Other chest pain: Secondary | ICD-10-CM | POA: Diagnosis not present

## 2018-07-20 ENCOUNTER — Institutional Professional Consult (permissible substitution): Payer: Self-pay | Admitting: Neurology

## 2018-07-20 LAB — BASIC METABOLIC PANEL
BUN / CREAT RATIO: 17 (ref 9–20)
BUN: 14 mg/dL (ref 6–24)
CO2: 23 mmol/L (ref 20–29)
Calcium: 9.6 mg/dL (ref 8.7–10.2)
Chloride: 101 mmol/L (ref 96–106)
Creatinine, Ser: 0.83 mg/dL (ref 0.76–1.27)
GFR calc non Af Amer: 98 mL/min/{1.73_m2} (ref >59)
GFR, EST AFRICAN AMERICAN: 113 mL/min/{1.73_m2} (ref >59)
Glucose: 105 mg/dL — ABNORMAL HIGH (ref 65–99)
Potassium: 4.1 mmol/L (ref 3.5–5.2)
Sodium: 140 mmol/L (ref 134–144)

## 2018-07-20 NOTE — Telephone Encounter (Signed)
Pt stopped taking amlodipine on Sunday 07/16/18. Stated he is still experiencing leg pain.

## 2018-07-20 NOTE — Telephone Encounter (Signed)
Reschedule follow up with me to an earlier date to discuss this further.   Thanks MJP

## 2018-07-20 NOTE — Telephone Encounter (Signed)
Amlodipine can cause leg swelling, but it should not cause leg pain. Hod for next 1-2 days and see if symptoms improve.   Thanks MJP

## 2018-07-25 ENCOUNTER — Telehealth (HOSPITAL_COMMUNITY): Payer: Self-pay | Admitting: Emergency Medicine

## 2018-07-25 NOTE — Telephone Encounter (Signed)
Called the patient's wife and informed her I resent the order to our contact person at [facility] and informed them Ms. Jonathan Burke should help with this. Evans Lance confirmed they got it and will complete the set up

## 2018-07-25 NOTE — Telephone Encounter (Signed)
Pt's wife Rene Kocher on [place] called stating that she was informed that [facility] has not received orders as of yet. She also stated that these orders should be sent to the attention of [name]. Pt's wife would like a call back with an update. Please advise.

## 2018-07-25 NOTE — Telephone Encounter (Signed)
I have forwarded the orders once more through our community message system to our contact person with [facility]. I attempted to add [name] to the message but didn't give me that option so I informed Jonathan Burke (our contact person) The order has been sent. Advising them initial order thought was sent on 2/17. Advised they get him set up asap.

## 2018-07-25 NOTE — Telephone Encounter (Signed)
Reaching out to patient to offer assistance regarding upcoming cardiac imaging study; pt verbalizes understanding of appt date/time, parking situation and where to check in, pre-test NPO status and medications ordered, and verified current allergies; name and call back number provided for further questions should they arise Sherilee Smotherman RN Navigator Cardiac Imaging  Heart and Vascular [phone] office [phone] cell 

## 2018-07-26 ENCOUNTER — Ambulatory Visit (HOSPITAL_COMMUNITY)
Admission: RE | Admit: 2018-07-26 | Discharge: 2018-07-26 | Disposition: A | Discharge status: Home / Self Care | Payer: BLUE CROSS/BLUE SHIELD | Source: Ambulatory Visit | Attending: Cardiology | Admitting: Cardiology

## 2018-07-26 DIAGNOSIS — R0602 Shortness of breath: Secondary | ICD-10-CM

## 2018-07-26 DIAGNOSIS — R079 Chest pain, unspecified: Secondary | ICD-10-CM | POA: Diagnosis not present

## 2018-07-26 DIAGNOSIS — I2510 Atherosclerotic heart disease of native coronary artery without angina pectoris: Secondary | ICD-10-CM | POA: Insufficient documentation

## 2018-07-26 MED ORDER — METOPROLOL TARTRATE 5 MG/5ML IV SOLN
INTRAVENOUS | Status: AC
  Filled 2018-07-26: qty 20

## 2018-07-26 MED ORDER — IOPAMIDOL (ISOVUE-370) INJECTION 76%
80.0000 mL | Freq: Once | INTRAVENOUS | Status: AC | PRN
  Administered 2018-07-26: 80 mL via INTRAVENOUS

## 2018-07-26 MED ORDER — NITROGLYCERIN 0.4 MG SL SUBL
0.8000 mg | SUBLINGUAL_TABLET | Freq: Once | SUBLINGUAL | Status: AC
  Administered 2018-07-26: 0.8 mg via SUBLINGUAL
  Filled 2018-07-26: qty 25

## 2018-07-26 MED ORDER — NITROGLYCERIN 0.4 MG SL SUBL
SUBLINGUAL_TABLET | SUBLINGUAL | Status: AC
  Filled 2018-07-26: qty 2

## 2018-07-26 MED ORDER — METOPROLOL TARTRATE 5 MG/5ML IV SOLN
10.0000 mg | INTRAVENOUS | Status: DC | PRN
  Administered 2018-07-26: 10 mg via INTRAVENOUS
  Filled 2018-07-26 (×2): qty 10

## 2018-07-30 NOTE — Progress Notes (Signed)
Patient is here for follow up visit.  Subjective:   Jonathan Burke, male    DOB: Sep 22, 1960, 58 y.o.   MRN: [id number]   Chief Complaint  Patient presents with  . Chest Pain    f/u tests     HPI  58 year old Caucasian male with hypertension, incidental finding of dilated ascending aorta, atypical chest pain, exertional dyspnea, family h/o premartue coronary artery disease.  H/o sudden death in father, brother, paternal grandfather in their 83s and 53s.   I had recommended amlodipine 5 mg daily for blood pressure. However, patient had intolerance to this with leg edema and pain. He udnerwent coroanry CTA and chest CTA that nonobstructive CAD in prox LAD. D1 . CTA chest showed uncomplicated fusiform aneurysmal dilatation of ascending thoracic aorta measuring 45 mm in diameter. No evidence of thoracic aortic dissection or periaortic stranding.  Patient is here for follow up to discuss the results.  On further review today, patient tells me that his father, brother, paternal grandfather all had sudden death in their 38s and 33s.  Exact reason of sudden death was not known.  Past Medical History:  Diagnosis Date  . Blood in urine   . 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 Chest pain   . Complication of anesthesia    states is hard to wake up  . Dental crowns present    also caps  . Dilated aortic root ([place])   . Enlarged prostate   . GERD (gastroesophageal reflux disease)    daily OTC  . H/O hiatal hernia    pt unsure if hernia or not, but has a knot in his stomach  . Headache(784.0)    tension  . PONV (postoperative nausea and vomiting)   . Small bowel obstruction ([place]) 07/17/2012     Past Surgical History:  Procedure Laterality Date  . APPENDECTOMY    . CARPAL TUNNEL RELEASE  09/09/2011   Procedure: CARPAL TUNNEL RELEASE;  Surgeon: Cammie Sickle., MD;  Location: [facility];  Service: Orthopedics;  Laterality: Left;  . CHOLECYSTECTOMY    .  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 ESOPHAGOGASTRODUODENOSCOPY N/A 06/05/2013   Procedure: ESOPHAGOGASTRODUODENOSCOPY (EGD);  Surgeon: Winfield Cunas., MD;  Location: [facility];  Service: Endoscopy;  Laterality: N/A;  . LAPAROSCOPIC LYSIS OF ADHESIONS N/A 05/29/2013   Procedure: DIAGNOSTIC LAPAROSCOPY CONVERTED TO EXPLORATORY LAPAROTOMY, LYSIS OF ADHESIONS ;  Surgeon: Harl Bowie, MD;  Location: [facility];  Service: General;  Laterality: N/A;  . LUMBAR LAMINECTOMY/DECOMPRESSION MICRODISCECTOMY  10/29/1999   L5-S1  . SHOULDER SURGERY     left     Social History   Socioeconomic History  . Marital status: Married    Spouse name: Not on file  . Number of children: Not on file  . Years of education: Not on file  . Highest education level: Not on file  Occupational History  . Not on file  Social Needs  . Financial resource strain: Not on file  . Food insecurity:    Worry: Not on file    Inability: Not on file  . Transportation needs:    Medical: Not on file    Non-medical: Not on file  Tobacco Use  . Smoking status: Never Smoker  . Smokeless tobacco: Never Used  Substance and Sexual Activity  . Alcohol use: No  . Drug use: No  . Sexual activity:  Yes    Partners: Female  Lifestyle  . Physical activity:    Days per week: Not on file    Minutes per session: Not on file  . Stress: Not on file  Relationships  . Social connections:    Talks on phone: Not on file    Gets together: Not on file    Attends religious service: Not on file    Active member of club or organization: Not on file    Attends meetings of clubs or organizations: Not on file    Relationship status: Not on file  . Intimate partner violence:    Fear of current or ex partner: Not on file    Emotionally abused: Not on file    Physically abused: Not on file    Forced sexual activity: Not on file  Other Topics Concern  . Not on file    Social History Narrative  . Not on file     Current Outpatient Medications on File Prior to Visit  Medication Sig Dispense Refill  . amLODipine (NORVASC) 5 MG tablet Take 1 tablet by mouth daily.    Marland Kitchen aspirin EC 81 MG tablet Take 81 mg by mouth daily.    . Cholecalciferol (VITAMIN D) 2000 UNITS CAPS Take 4,000 Units by mouth daily.    . pantoprazole (PROTONIX) 40 MG tablet Take 1 tablet (40 mg total) by mouth daily. 30 tablet 1  . sildenafil (VIAGRA) 100 MG tablet Take 100 mg by mouth daily as needed for erectile dysfunction.     No current facility-administered medications on file prior to visit.     Cardiovascular studies:  CTA chest/coroanry 07/26/2018: Nonobstructive FFR negative CAD (Prox LAD, D1) Aneurysmal disease of the ascending thoracic aorta with maximal diameter of approximately 4.5-4.6 cm. Correlation suggested with echocardiography. Ascending thoracic aortic aneurysm.  Echocardiogram 07/13/2018: Left ventricle cavity is normal in size. Mild concentric hypertrophy of the left ventricle. Normal global wall motion. Normal diastolic filling pattern. Calculated EF 48%. Visual EF 50-55% The aortic root is upper limit normal at 3.7 cm. Mild aortic valve leaflet calcification. No aortic stenosis or regurgitation noted. Mild (Grade I) mitral regurgitation. Mild tricuspid regurgitation.  No evidence of pulmonary hypertension.  Labs 06/22/2018: Glucose 84. BUN/Cr 15/0.9 eGFR 87. Na/K 139/4.7. Rest of the CMP normal.  H/H 15/45. MCV 91. Platelets 325  Chol 137, TG 110, HDL 40, LDL 76   Review of Systems  Constitution: Negative for decreased appetite, malaise/fatigue, weight gain and weight loss.  HENT: Negative for congestion.   Eyes: Negative for visual disturbance.  Cardiovascular: Negative for chest pain, claudication, dyspnea on exertion, leg swelling, palpitations and syncope.  Respiratory: Negative for shortness of breath.   Endocrine: Negative for cold  intolerance.  Hematologic/Lymphatic: Does not bruise/bleed easily.  Skin: Negative for itching and rash.  Musculoskeletal: Negative for myalgias.  Gastrointestinal: Negative for abdominal pain, nausea and vomiting.  Genitourinary: Negative for dysuria.  Neurological: Negative for dizziness and weakness.  Psychiatric/Behavioral: The patient is not nervous/anxious.   All other systems reviewed and are negative.      Objective:    Vitals:   07/31/18 0923  BP: 140/90  Pulse: 74  SpO2: 97%     Physical Exam  Constitutional: He is oriented to person, place, and time. He appears well-developed and well-nourished. No distress.  HENT:  Head: Normocephalic and atraumatic.  Eyes: Pupils are equal, round, and reactive to light. Conjunctivae are normal.  Neck: No JVD present.  Cardiovascular: Normal  rate, regular rhythm and intact distal pulses.  No murmur heard. Pulmonary/Chest: Effort normal and breath sounds normal. He has no wheezes. He has no rales.  Abdominal: Soft. Bowel sounds are normal. There is no rebound.  Musculoskeletal:        General: No edema.  Lymphadenopathy:    He has no cervical adenopathy.  Neurological: He is alert and oriented to person, place, and time. No cranial nerve deficit.  Skin: Skin is warm and dry.  Psychiatric: He has a normal mood and affect.  Nursing note and vitals reviewed.       Assessment & Recommendations:   58 year old Caucasian male with hypertension, nonobstructive CAD, ascending aorta aneurysm 45 mm.  Atypical chest pain: Prox LAD and D1 moderate CT FFR negative disease. Chest pain has since resolved.  Recommend primary prevention with Aspirin 81 mg. Add moderate intensity statin with Lipitor 10 mg daily.   Ascending aorta aneurysm: Uncomplicated 45 cm on CTA. Echocardiogram measured aortic root at 3.7 cm, which is likely due to inadeqaute visualization of ascneding aorta. Recommend repeat CTA in 6 months. If stable, will then  obtain annual CTA. Recommend blood pressure and heart rate control. Added atenolol 25 mg bid, lisinopril 10 mg daily. Check BMP in 2 weeks.  Given his strong family history of sudden death, I recommend genetic testing to evaluate for familial aortic diseased. He does not have any physical exam signs to suggest any aortic syndrome.   Hypertension: Intolerant to amlodipine, thus stopped.  Atenolol and lisinopril added as above.  I will see him back in 8 weeks.    Nigel Mormon, MD [facility] Pager: [phone] Office: [phone] If no answer Cell [phone]

## 2018-07-31 ENCOUNTER — Encounter: Payer: Self-pay | Admitting: Cardiology

## 2018-07-31 ENCOUNTER — Ambulatory Visit: Payer: BLUE CROSS/BLUE SHIELD | Admitting: Cardiology

## 2018-07-31 VITALS — BP 140/90 | HR 74 | Ht 68.0 in | Wt 230.0 lb

## 2018-07-31 DIAGNOSIS — I7121 Aneurysm of the ascending aorta, without rupture: Secondary | ICD-10-CM | POA: Insufficient documentation

## 2018-07-31 DIAGNOSIS — I10 Essential (primary) hypertension: Secondary | ICD-10-CM | POA: Insufficient documentation

## 2018-07-31 DIAGNOSIS — I712 Thoracic aortic aneurysm, without rupture: Secondary | ICD-10-CM

## 2018-07-31 DIAGNOSIS — I2510 Atherosclerotic heart disease of native coronary artery without angina pectoris: Secondary | ICD-10-CM | POA: Diagnosis not present

## 2018-07-31 MED ORDER — ATENOLOL 25 MG PO TABS: 25.0000 mg | ORAL_TABLET | Freq: Two times a day (BID) | ORAL | 2 refills | Status: DC

## 2018-07-31 MED ORDER — ATORVASTATIN CALCIUM 10 MG PO TABS: 10.0000 mg | ORAL_TABLET | Freq: Every day | ORAL | 2 refills | Status: DC

## 2018-07-31 MED ORDER — LISINOPRIL 10 MG PO TABS: 10.0000 mg | ORAL_TABLET | Freq: Every day | ORAL | 2 refills | Status: DC

## 2018-08-01 DIAGNOSIS — G4733 Obstructive sleep apnea (adult) (pediatric): Secondary | ICD-10-CM | POA: Diagnosis not present

## 2018-08-02 ENCOUNTER — Telehealth: Payer: Self-pay

## 2018-08-02 NOTE — Telephone Encounter (Signed)
His MyChart is "pending". He should be able to see the note once it is active. If not, feel free to share a copy of my office note with them,  Thanks MJP

## 2018-08-11 NOTE — Progress Notes (Signed)
Patient was here for genetic testing and had vital signs checked.

## 2018-08-14 ENCOUNTER — Ambulatory Visit: Payer: BLUE CROSS/BLUE SHIELD | Admitting: Cardiology

## 2018-08-14 ENCOUNTER — Encounter: Payer: Self-pay | Admitting: Cardiology

## 2018-08-14 ENCOUNTER — Other Ambulatory Visit (HOSPITAL_COMMUNITY): Payer: Self-pay | Admitting: Cardiology

## 2018-08-14 ENCOUNTER — Ambulatory Visit: Payer: Self-pay | Admitting: Cardiology

## 2018-08-14 ENCOUNTER — Other Ambulatory Visit: Payer: Self-pay

## 2018-08-14 VITALS — BP 120/81 | HR 57 | Ht 68.0 in | Wt 230.0 lb

## 2018-08-14 DIAGNOSIS — I10 Essential (primary) hypertension: Secondary | ICD-10-CM | POA: Diagnosis not present

## 2018-08-14 DIAGNOSIS — I712 Thoracic aortic aneurysm, without rupture: Secondary | ICD-10-CM

## 2018-08-14 DIAGNOSIS — I77810 Thoracic aortic ectasia: Secondary | ICD-10-CM | POA: Diagnosis not present

## 2018-08-14 DIAGNOSIS — I7121 Aneurysm of the ascending aorta, without rupture: Secondary | ICD-10-CM

## 2018-08-14 DIAGNOSIS — I2510 Atherosclerotic heart disease of native coronary artery without angina pectoris: Secondary | ICD-10-CM | POA: Diagnosis not present

## 2018-08-14 DIAGNOSIS — Z8249 Family history of ischemic heart disease and other diseases of the circulatory system: Secondary | ICD-10-CM | POA: Diagnosis not present

## 2018-08-14 DIAGNOSIS — Z8241 Family history of sudden cardiac death: Secondary | ICD-10-CM | POA: Diagnosis not present

## 2018-08-15 LAB — BASIC METABOLIC PANEL
BUN/Creatinine Ratio: 18 (ref 9–20)
BUN: 16 mg/dL (ref 6–24)
CHLORIDE: 103 mmol/L (ref 96–106)
CO2: 27 mmol/L (ref 20–29)
Calcium: 9.4 mg/dL (ref 8.7–10.2)
Creatinine, Ser: 0.91 mg/dL (ref 0.76–1.27)
GFR calc Af Amer: 108 mL/min/{1.73_m2} (ref >59)
GFR calc non Af Amer: 93 mL/min/{1.73_m2} (ref >59)
Glucose: 106 mg/dL — ABNORMAL HIGH (ref 65–99)
POTASSIUM: 4.8 mmol/L (ref 3.5–5.2)
Sodium: 142 mmol/L (ref 134–144)

## 2018-09-01 DIAGNOSIS — G4733 Obstructive sleep apnea (adult) (pediatric): Secondary | ICD-10-CM | POA: Diagnosis not present

## 2018-09-04 ENCOUNTER — Telehealth: Payer: Self-pay | Admitting: Cardiology

## 2018-09-08 ENCOUNTER — Other Ambulatory Visit: Payer: Self-pay

## 2018-09-08 ENCOUNTER — Ambulatory Visit
Admission: RE | Admit: 2018-09-08 | Discharge: 2018-09-08 | Disposition: A | Discharge status: Home / Self Care | Payer: BLUE CROSS/BLUE SHIELD | Source: Ambulatory Visit | Attending: Cardiology | Admitting: Cardiology

## 2018-09-08 DIAGNOSIS — I712 Thoracic aortic aneurysm, without rupture: Secondary | ICD-10-CM | POA: Diagnosis not present

## 2018-09-08 DIAGNOSIS — I7121 Aneurysm of the ascending aorta, without rupture: Secondary | ICD-10-CM

## 2018-09-08 MED ORDER — IOPAMIDOL (ISOVUE-370) INJECTION 76%
75.0000 mL | Freq: Once | INTRAVENOUS | Status: AC | PRN
  Administered 2018-09-08: 75 mL via INTRAVENOUS

## 2018-09-11 ENCOUNTER — Telehealth: Payer: Self-pay

## 2018-09-11 NOTE — Telephone Encounter (Signed)
CT scan is reassuring. No change in aneurysm. No imminent risk to the patient. Repeat CT scan will be ordered for 08/2019, when I see him for virtual visit on 04/27.   Thanks MJP

## 2018-09-11 NOTE — Telephone Encounter (Signed)
S/w pt's wife advised her of test results/

## 2018-09-11 NOTE — Telephone Encounter (Signed)
Genetic test results will be discussed on virtual visit.

## 2018-09-12 ENCOUNTER — Telehealth: Payer: Self-pay | Admitting: Cardiovascular Disease

## 2018-09-12 NOTE — Telephone Encounter (Signed)
Virtual Visit Pre-Appointment Phone Call  Steps For Call:  1. Confirm consent - "In the setting of the current Covid19 crisis, you are scheduled for a (phone or video) visit with your provider on (date) at (time).  Just as we do with many in-office visits, in order for you to participate in this visit, we must obtain consent.  If you'd like, I can send this to your mychart (if signed up) or email for you to review.  Otherwise, I can obtain your verbal consent now.  All virtual visits are billed to your insurance company just like a normal visit would be.  By agreeing to a virtual visit, we'd like you to understand that the technology does not allow for your provider to perform an examination, and thus may limit your provider's ability to fully assess your condition.  Finally, though the technology is pretty good, we cannot assure that it will always work on either your or our end, and in the setting of a video visit, we may have to convert it to a phone-only visit.  In either situation, we cannot ensure that we have a secure connection.  Are you willing to proceed?" STAFF: Did the patient verbally acknowledge consent to treatment? Yes   2. Give patient instructions for WebEx/MyChart download to smartphone or Doximity/Doxy.me if video visit (depending on what platform provider is using)  3. Advise patient to be prepared with any vital sign or heart rhythm information, their current medicines, and a piece of paper and pen handy for any instructions they may receive the day of their visit  4. Inform patient they will receive a phone call 15 minutes prior to their appointment time (may be from unknown caller ID) so they should be prepared to answer  5. Confirm that appointment type is correct in Epic appointment notes (video vs telephone)    TELEPHONE CALL NOTE  LONN Burke has been deemed a candidate for a follow-up tele-health visit to limit community exposure during the Covid-19 pandemic. I  spoke with the patient via phone to ensure availability of phone/video source, confirm preferred email & phone number, and discuss instructions and expectations.  I reminded Jonathan Burke to be prepared with any vital sign and/or heart rhythm information that could potentially be obtained via home monitoring, at the time of his visit. I reminded Jonathan Burke to expect a phone call at the time of his visit if his visit.  [name] 09/12/2018 11:35 AM   DOWNLOADING THE WEBEX SOFTWARE TO SMARTPHONE  - If Apple, go to [employer] and type in WebEx in the search bar. Download [employer], the blue/green circle. The app is free but as with any other app downloads, their phone may require them to verify saved payment information or Apple password. The patient does NOT have to create an account.  - If Android, ask patient to go to [employer] and type in WebEx in the search bar. Download [employer], the blue/green circle. The app is free but as with any other app downloads, their phone may require them to verify saved payment information or Android password. The patient does NOT have to create an account.   CONSENT FOR TELE-HEALTH VISIT - PLEASE REVIEW  I hereby voluntarily request, consent and authorize [facility] and its employed or contracted physicians, physician assistants, nurse practitioners or other licensed health care professionals (the Practitioner), to provide me with telemedicine health care services (the Services") as  deemed necessary by the treating Practitioner. I acknowledge and consent to receive the Services by the Practitioner via telemedicine. I understand that the telemedicine visit will involve communicating with the Practitioner through live audiovisual communication technology and the disclosure of certain medical information by electronic transmission. I acknowledge that I have been given the opportunity to request an in-person assessment or  other available alternative prior to the telemedicine visit and am voluntarily participating in the telemedicine visit.  I understand that I have the right to withhold or withdraw my consent to the use of telemedicine in the course of my care at any time, without affecting my right to future care or treatment, and that the Practitioner or I may terminate the telemedicine visit at any time. I understand that I have the right to inspect all information obtained and/or recorded in the course of the telemedicine visit and may receive copies of available information for a reasonable fee.  I understand that some of the potential risks of receiving the Services via telemedicine include:   Delay or interruption in medical evaluation due to technological equipment failure or disruption;  Information transmitted may not be sufficient (e.g. poor resolution of images) to allow for appropriate medical decision making by the Practitioner; and/or   In rare instances, security protocols could fail, causing a breach of personal health information.  Furthermore, I acknowledge that it is my responsibility to provide information about my medical history, conditions and care that is complete and accurate to the best of my ability. I acknowledge that Practitioner's advice, recommendations, and/or decision may be based on factors not within their control, such as incomplete or inaccurate data provided by me or distortions of diagnostic images or specimens that may result from electronic transmissions. I understand that the practice of medicine is not an exact science and that Practitioner makes no warranties or guarantees regarding treatment outcomes. I acknowledge that I will receive a copy of this consent concurrently upon execution via email to the email address I last provided but may also request a printed copy by calling the office of [facility].    I understand that my insurance will be billed for this visit.   I  have read or had this consent read to me.  I understand the contents of this consent, which adequately explains the benefits and risks of the Services being provided via telemedicine.   I have been provided ample opportunity to ask questions regarding this consent and the Services and have had my questions answered to my satisfaction.  I give my informed consent for the services to be provided through the use of telemedicine in my medical care  By participating in this telemedicine visit I agree to the above.

## 2018-09-17 ENCOUNTER — Encounter: Payer: Self-pay | Admitting: Adult Health

## 2018-09-18 ENCOUNTER — Telehealth: Payer: Self-pay

## 2018-09-18 NOTE — Telephone Encounter (Signed)
Spoke with the patient and I was able to r/s his appt with Shanda Bumps. Patient has given verbal consent to do a telephone visit and to file his insurance. Patient has an appt with Shanda Bumps 09/19/2018 at 12:45 pm.    In regards to his cpap report I have sent a message to the DME to get a copy of the patient's cpap report.

## 2018-09-19 ENCOUNTER — Other Ambulatory Visit: Payer: Self-pay

## 2018-09-19 ENCOUNTER — Encounter: Payer: Self-pay | Admitting: Adult Health

## 2018-09-19 ENCOUNTER — Ambulatory Visit (INDEPENDENT_AMBULATORY_CARE_PROVIDER_SITE_OTHER): Payer: BLUE CROSS/BLUE SHIELD | Admitting: Adult Health

## 2018-09-19 DIAGNOSIS — R519 Headache, unspecified: Secondary | ICD-10-CM

## 2018-09-19 DIAGNOSIS — G4733 Obstructive sleep apnea (adult) (pediatric): Secondary | ICD-10-CM | POA: Diagnosis not present

## 2018-09-19 DIAGNOSIS — Z9989 Dependence on other enabling machines and devices: Secondary | ICD-10-CM | POA: Diagnosis not present

## 2018-09-19 DIAGNOSIS — R51 Headache: Secondary | ICD-10-CM

## 2018-09-19 NOTE — Progress Notes (Signed)
[facility] [address] [place]. [place] [address] [phone]     Virtual Visit via Telephone Note  I connected with Jonathan Burke on 09/19/18 at 12:45 PM EDT by telephone located remotely within my own home and verified that I am speaking with the correct person using two identifiers who reports being located in his own home.    I discussed the limitations, risks, security and privacy concerns of performing an evaluation and management service by telephone and the availability of in person appointments. I also discussed with the patient that there may be a patient responsible charge related to this service. The patient expressed understanding and agreed to proceed.   History of Present Illness:  Jonathan Burke is a 58 y.o. male who was recently diagnosed with OSA and initiated CPAP.  He was initially scheduled for face-to-face office visit today for initial compliance visit at this time but due to COVID19, face-to-face office visit rescheduled for non-face-to-face telephone visit.  Compliance report from 08/19/18 - 09/17/18 shows 29/30 usage days with 24 days >4 hrs for 80% compliance. Average usage 5 hours and 11 minutes with residual AHI 2.0. Leaks in the 95th percentile 20.6. Pressure in the 95th percentile 10.4 with min pressure 5 and max pressure 16 with EPR level 3.  He states he has been doing well with use of CPAP machine and does improvement of his overall daytime fatigue level and denies any residual headaches.  He does continue to have mild daytime fatigue but he feels as though this is due to the type of work he does.  He has no concerns or complications using machine.  He is up-to-date on all his supplies.  No concerns or questions today.  Epworth scale: 9/24 (prior to CPAP use 16/24)    Observations/Objective:  General: Pleasant middle-aged Caucasian male asking and answering questions appropriately throughout conversation  Home sleep test 07/03/18  IMPRESSION: Moderate degree of Obstructive Sleep Apnea, moderate-  severe snoring, REM exacerbated AHI from 19.6 to 32.3/h. While no prolonged hypoxemia was noted, there was a significant  variability in heart rate.  RECOMMENDATION: I prefer to use CPAP in the treatment of REM  dependent sleep apnea. I ordered CPAP autotitration device,  pressure window from 5 through 16 cm water, 3 cm EPR, heated humidity, and mask of  patient's choice.     Assessment and Plan:  Jonathan Burke is a 58 y.o. male with underlying medical history of bowel obstruction, headache, GERD, obesity, bilateral tinnitus, sensorineural hearing loss bilateral with new dx of OSA with CPAP initiated. Optimal residual AHI 2.0.  As he has been doing well on current settings of CPAP with optimal residual AHI, recommend to continue current settings.  He was advised to contact DME company [facility] with any CPAP concerns.  Currently up-to-date on as needed supplies and is aware to contact DME company for any additional supplies.   Follow Up Instructions:   He will follow-up in 6 months with Aundra Millet, NP or call earlier if needed    I discussed the assessment and treatment plan with the patient.  The patient was provided an opportunity to ask questions and all were answered to their satisfaction. The patient agreed with the plan and verbalized an understanding of the instructions.   I provided 24 minutes of non-face-to-face time during this encounter.    George Hugh, AGNP-BC  [facility] [address] [place], [place] [address]  Phone [phone] Fax [phone] Note: This  document was prepared with digital dictation and possible smart phrase technology. Any transcriptional errors that result from this process are unintentional.

## 2018-09-19 NOTE — Progress Notes (Signed)
Made any corrections needed, and agree with history, physical, neuro exam,assessment and plan as stated.     Gatsby Chismar, MD [facility]  

## 2018-09-22 ENCOUNTER — Ambulatory Visit: Payer: Self-pay | Admitting: Adult Health

## 2018-09-24 NOTE — Progress Notes (Signed)
Patient is here for follow up visit.  Subjective:   Jonathan Burke, male    DOB: [birth year], 58 y.o.   MRN: [id number]  I connected withthe patient on 04/27/20by a video enabled telemedicine application and verified that I am speaking with the correct person using two identifiers.    I discussed the limitations of evaluation and management by telemedicine and the availability of in person appointments. The patient expressed understanding and agreed to proceed.   This visit type was conducted due to national recommendations for restrictions regarding the COVID-19 Pandemic (e.g. social distancing). This format is felt to be most appropriate for this patient at this time. All issues noted in this document were discussed and addressed. No physical exam was performed (except for noted visual exam findings with Tele health visits). The patient has consented to conduct a Tele health visit and understands insurance will be billed.     Chief complaint:  Aortic aneurysm  HPI  58 year old Caucasian male with hypertension, incidental finding of dilated ascending aorta, atypical chest pain, exertional dyspnea, family h/o premature coronary artery disease, family h/o sudden death  His genetic testing did not reveal any evidence of heriditory aortopathy. 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   He is doing well and doe snot have any complaints. Headaches have improved after controlling blood pressure. 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    Past Medical History:  Diagnosis Date  . Blood in urine   . 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 Chest pain   . Complication of anesthesia    states is hard to wake up  . Dental crowns present    also caps  . Dilated aortic root ([place])   . Enlarged prostate   . GERD (gastroesophageal reflux disease)    daily OTC  . H/O hiatal hernia    pt unsure if hernia or not, but has a knot  in his stomach  . Headache(784.0)    tension  . PONV (postoperative nausea and vomiting)   . Small bowel obstruction ([place]) 07/17/2012     Past Surgical History:  Procedure Laterality Date  . APPENDECTOMY    . CARPAL TUNNEL RELEASE  09/09/2011   Procedure: CARPAL TUNNEL RELEASE;  Surgeon: Cammie Sickle., MD;  Location: [facility];  Service: Orthopedics;  Laterality: Left;  . CHOLECYSTECTOMY    . 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 ESOPHAGOGASTRODUODENOSCOPY N/A 06/05/2013   Procedure: ESOPHAGOGASTRODUODENOSCOPY (EGD);  Surgeon: Winfield Cunas., MD;  Location: [facility] ENDOSCOPY;  Service: Endoscopy;  Laterality: N/A;  . LAPAROSCOPIC LYSIS OF ADHESIONS N/A 05/29/2013   Procedure: DIAGNOSTIC LAPAROSCOPY CONVERTED TO EXPLORATORY LAPAROTOMY, LYSIS OF ADHESIONS ;  Surgeon: Harl Bowie, MD;  Location: [facility];  Service: General;  Laterality: N/A;  . LUMBAR LAMINECTOMY/DECOMPRESSION MICRODISCECTOMY  10/29/1999   L5-S1  . SHOULDER SURGERY     left     Social History   Socioeconomic History  . Marital status: Married    Spouse name: Not on file  . Number of children: Not on file  . Years of education: Not on file  . Highest education level: Not on file  Occupational History  . Not on file  Social Needs  . Financial resource strain: Not on file  . Food insecurity:    Worry: Not on file  Inability: Not on file  . Transportation needs:    Medical: Not on file    Non-medical: Not on file  Tobacco Use  . Smoking status: Never Smoker  . Smokeless tobacco: Never Used  Substance and Sexual Activity  . Alcohol use: No  . Drug use: No  . Sexual activity: Yes    Partners: Female  Lifestyle  . Physical activity:    Days per week: Not on file    Minutes per session: Not on file  . Stress: Not on file  Relationships  . Social connections:    Talks on phone: Not on file    Gets together: Not on  file    Attends religious service: Not on file    Active member of club or organization: Not on file    Attends meetings of clubs or organizations: Not on file    Relationship status: Not on file  . Intimate partner violence:    Fear of current or ex partner: Not on file    Emotionally abused: Not on file    Physically abused: Not on file    Forced sexual activity: Not on file  Other Topics Concern  . Not on file  Social History Narrative  . Not on file     Current Outpatient Medications on File Prior to Visit  Medication Sig Dispense Refill  . amLODipine (NORVASC) 5 MG tablet Take 1 tablet by mouth daily.    Marland Kitchen aspirin EC 81 MG tablet Take 81 mg by mouth daily.    Marland Kitchen atenolol (TENORMIN) 25 MG tablet Take 1 tablet (25 mg total) by mouth 2 (two) times daily. 60 tablet 2  . atorvastatin (LIPITOR) 10 MG tablet Take 1 tablet (10 mg total) by mouth daily. 30 tablet 2  . Cholecalciferol (VITAMIN D) 2000 UNITS CAPS Take 4,000 Units by mouth daily.    Marland Kitchen lisinopril (PRINIVIL,ZESTRIL) 10 MG tablet Take 1 tablet (10 mg total) by mouth daily. 30 tablet 2  . pantoprazole (PROTONIX) 40 MG tablet Take 1 tablet (40 mg total) by mouth daily. 30 tablet 1  . sildenafil (VIAGRA) 100 MG tablet Take 100 mg by mouth daily as needed for erectile dysfunction.     No current facility-administered medications on file prior to visit.     Cardiovascular studies:  CTA 09/08/2018:  4.3 cm ascending thoracic aortic aneurysm. Recommend annual imaging followup by CTA or MRA.  CTA chest/coroanry 07/26/2018: Nonobstructive FFR negative CAD (Prox LAD, D1) Aneurysmal disease of the ascending thoracic aorta with maximal diameter of approximately 4.5-4.6 cm. Correlation suggested with echocardiography. Ascending thoracic aortic aneurysm.  Echocardiogram 07/13/2018: Left ventricle cavity is normal in size. Mild concentric hypertrophy of the left ventricle. Normal global wall motion. Normal diastolic filling  pattern. Calculated EF 48%. Visual EF 50-55% The aortic root is upper limit normal at 3.7 cm. Mild aortic valve leaflet calcification. No aortic stenosis or regurgitation noted. Mild (Grade I) mitral regurgitation. Mild tricuspid regurgitation.  No evidence of pulmonary hypertension.  Labs 06/22/2018: Glucose 84. BUN/Cr 15/0.9 eGFR 87. Na/K 139/4.7. Rest of the CMP normal.  H/H 15/45. MCV 91. Platelets 325  Chol 137, TG 110, HDL 40, LDL 76   Review of Systems  Constitution: Negative for decreased appetite, malaise/fatigue, weight gain and weight loss.  HENT: Negative for congestion.   Eyes: Negative for visual disturbance.  Cardiovascular: Negative for chest pain, claudication, dyspnea on exertion, leg swelling, palpitations and syncope.  Respiratory: Negative for shortness of breath.   Endocrine:  Negative for cold intolerance.  Hematologic/Lymphatic: Does not bruise/bleed easily.  Skin: Negative for itching and rash.  Musculoskeletal: Negative for myalgias.  Gastrointestinal: Negative for abdominal pain, nausea and vomiting.  Genitourinary: Negative for dysuria.  Neurological: Negative for dizziness and weakness.  Psychiatric/Behavioral: The patient is not nervous/anxious.   All other systems reviewed and are negative.      Objective:    Vitals:   09/25/18 1254  BP: 105/64  Pulse: 63     Physical Exam  Constitutional: He is oriented to person, place, and time. He appears well-developed and well-nourished. No distress.  HENT:  Head: Normocephalic and atraumatic.  Eyes: Pupils are equal, round, and reactive to light. Conjunctivae are normal.  Neck: No JVD present.  Cardiovascular: Normal rate, regular rhythm and intact distal pulses.  No murmur heard. Pulmonary/Chest: Effort normal and breath sounds normal. He has no wheezes. He has no rales.  Abdominal: Soft. Bowel sounds are normal. There is no rebound.  Musculoskeletal:        General: No edema.  Lymphadenopathy:     He has no cervical adenopathy.  Neurological: He is alert and oriented to person, place, and time. No cranial nerve deficit.  Skin: Skin is warm and dry.  Psychiatric: He has a normal mood and affect.  Nursing note and vitals reviewed.       Assessment & Recommendations:   58 year old Caucasian male with hypertension, nonobstructive CAD, ascending aorta aneurysm 45 mm.  Atypical chest pain: Prox LAD and D1 moderate CT FFR negative disease. Chest pain has since resolved.  Recommend primary prevention with Aspirin 81 mg, lipitor 10 mg daily.  Ascending aorta aneurysm: Stable 4.3-4.5 cm ascending aorta aneurysm. Continue atenolol and lisinopril. Repeat CTA 08/2019 Avoid lifting >20 lb weight  Hypertension: Controlled. Atenolol and lisinopril added as above.  Follow up in 1 year.  Nigel Mormon, MD [facility] Pager: [phone] Office: [phone] If no answer Cell [phone]

## 2018-09-25 ENCOUNTER — Ambulatory Visit: Payer: BLUE CROSS/BLUE SHIELD | Admitting: Cardiology

## 2018-09-25 ENCOUNTER — Other Ambulatory Visit: Payer: Self-pay

## 2018-09-25 ENCOUNTER — Encounter: Payer: Self-pay | Admitting: Cardiology

## 2018-09-25 VITALS — BP 105/64 | HR 63 | Ht 68.0 in | Wt 219.0 lb

## 2018-09-25 DIAGNOSIS — I712 Thoracic aortic aneurysm, without rupture, unspecified: Secondary | ICD-10-CM

## 2018-09-25 DIAGNOSIS — I2510 Atherosclerotic heart disease of native coronary artery without angina pectoris: Secondary | ICD-10-CM

## 2018-09-28 ENCOUNTER — Telehealth: Payer: Self-pay

## 2018-09-28 ENCOUNTER — Other Ambulatory Visit: Payer: Self-pay

## 2018-09-28 ENCOUNTER — Telehealth (INDEPENDENT_AMBULATORY_CARE_PROVIDER_SITE_OTHER): Payer: BLUE CROSS/BLUE SHIELD | Admitting: Cardiovascular Disease

## 2018-09-28 ENCOUNTER — Encounter: Payer: Self-pay | Admitting: Cardiovascular Disease

## 2018-09-28 VITALS — BP 105/63 | HR 56 | Ht 68.0 in | Wt 219.0 lb

## 2018-09-28 DIAGNOSIS — I10 Essential (primary) hypertension: Secondary | ICD-10-CM

## 2018-09-28 DIAGNOSIS — I712 Thoracic aortic aneurysm, without rupture: Secondary | ICD-10-CM | POA: Diagnosis not present

## 2018-09-28 DIAGNOSIS — I2510 Atherosclerotic heart disease of native coronary artery without angina pectoris: Secondary | ICD-10-CM

## 2018-09-28 DIAGNOSIS — I7121 Aneurysm of the ascending aorta, without rupture: Secondary | ICD-10-CM

## 2018-09-28 NOTE — Telephone Encounter (Signed)
Virtual Visit Pre-Appointment Phone Call  "(Name), I am calling you today to discuss your upcoming appointment. We are currently trying to limit exposure to the virus that causes COVID-19 by seeing patients at home rather than in the office."  1. "What is the BEST phone number to call the day of the visit?" - include this in appointment notes  2. "Do you have or have access to (through a family member/friend) a smartphone with video capability that we can use for your visit?" a. If yes - list this number in appt notes as "cell" (if different from BEST phone #) and list the appointment type as a VIDEO visit in appointment notes b. If no - list the appointment type as a PHONE visit in appointment notes  3. Confirm consent - "In the setting of the current Covid19 crisis, you are scheduled for a (phone or video) visit with your provider on (date) at (time).  Just as we do with many in-office visits, in order for you to participate in this visit, we must obtain consent.  If you'd like, I can send this to your mychart (if signed up) or email for you to review.  Otherwise, I can obtain your verbal consent now.  All virtual visits are billed to your insurance company just like a normal visit would be.  By agreeing to a virtual visit, we'd like you to understand that the technology does not allow for your provider to perform an examination, and thus may limit your provider's ability to fully assess your condition. If your provider identifies any concerns that need to be evaluated in person, we will make arrangements to do so.  Finally, though the technology is pretty good, we cannot assure that it will always work on either your or our end, and in the setting of a video visit, we may have to convert it to a phone-only visit.  In either situation, we cannot ensure that we have a secure connection.  Are you willing to proceed?" STAFF: Did the patient verbally acknowledge consent to telehealth visit? Document  YES/NO here: Yes  4. Advise patient to be prepared - "Two hours prior to your appointment, go ahead and check your blood pressure, pulse, oxygen saturation, and your weight (if you have the equipment to check those) and write them all down. When your visit starts, your provider will ask you for this information. If you have an Apple Watch or Kardia device, please plan to have heart rate information ready on the day of your appointment. Please have a pen and paper handy nearby the day of the visit as well."  5. Give patient instructions for MyChart download to smartphone OR [email or web address] as below if video visit (depending on what platform provider is using)  6. Inform patient they will receive a phone call 15 minutes prior to their appointment time (may be from unknown caller ID) so they should be prepared to answer    TELEPHONE CALL NOTE  Jonathan Burke has been deemed a candidate for a follow-up tele-health visit to limit community exposure during the Covid-19 pandemic. I spoke with the patient via phone to ensure availability of phone/video source, confirm preferred email & phone number, and discuss instructions and expectations.  I reminded Jonathan Burke to be prepared with any vital sign and/or heart rhythm information that could potentially be obtained via home monitoring, at the time of his visit. I reminded Jonathan Burke to expect a phone call prior to  his visit.  Bishop DublinSharon Ritisha Deitrick, [facility] MountainsideCMA 09/28/2018 10:51 AM   INSTRUCTIONS FOR DOWNLOADING THE MYCHART APP TO SMARTPHONE  - The patient must first make sure to have activated MyChart and know their login information - If Apple, go to [employer] Store and type in MyChart in the search bar and download the app. If Android, ask patient to go to [employer] Play Store and type in [facility]MyChart in the search bar and download the app. The app is free but as with any other app downloads, their phone may require them to verify saved payment information or Apple/Android  password.  - The patient will need to then log into the app with their MyChart username and password, and select [facility] as their healthcare provider to link the account. When it is time for your visit, go to the MyChart app, find appointments, and click Begin Video Visit. Be sure to Select Allow for your device to access the Microphone and Camera for your visit. You will then be connected, and your provider will be with you shortly.  **If they have any issues connecting, or need assistance please contact MyChart service desk [phone])**  **If using a computer, in order to ensure the best quality for their visit they will need to use either of the following Internet Browsers: [employer] Edge, or Google Chrome**  IF USING DOXIMITY or DOXY.ME - The patient will receive a link just prior to their visit by text.     FULL LENGTH CONSENT FOR TELE-HEALTH VISIT   I hereby voluntarily request, consent and authorize [facility] and its employed or contracted physicians, physician assistants, nurse practitioners or other licensed health care professionals (the Practitioner), to provide me with telemedicine health care services (the "Services") as deemed necessary by the treating Practitioner. I acknowledge and consent to receive the Services by the Practitioner via telemedicine. I understand that the telemedicine visit will involve communicating with the Practitioner through live audiovisual communication technology and the disclosure of certain medical information by electronic transmission. I acknowledge that I have been given the opportunity to request an in-person assessment or other available alternative prior to the telemedicine visit and am voluntarily participating in the telemedicine visit.  I understand that I have the right to withhold or withdraw my consent to the use of telemedicine in the course of my care at any time, without affecting my right to future care or treatment,  and that the Practitioner or I may terminate the telemedicine visit at any time. I understand that I have the right to inspect all information obtained and/or recorded in the course of the telemedicine visit and may receive copies of available information for a reasonable fee.  I understand that some of the potential risks of receiving the Services via telemedicine include:  Marland Kitchen. Delay or interruption in medical evaluation due to technological equipment failure or disruption; . Information transmitted may not be sufficient (e.g. poor resolution of images) to allow for appropriate medical decision making by the Practitioner; and/or  . In rare instances, security protocols could fail, causing a breach of personal health information.  Furthermore, I acknowledge that it is my responsibility to provide information about my medical history, conditions and care that is complete and accurate to the best of my ability. I acknowledge that Practitioner's advice, recommendations, and/or decision may be based on factors not within their control, such as incomplete or inaccurate data provided by me or distortions of diagnostic images or specimens that may result from electronic transmissions. I  understand that the practice of medicine is not an exact science and that Practitioner makes no warranties or guarantees regarding treatment outcomes. I acknowledge that I will receive a copy of this consent concurrently upon execution via email to the email address I last provided but may also request a printed copy by calling the office of [facility].    I understand that my insurance will be billed for this visit.   I have read or had this consent read to me. . I understand the contents of this consent, which adequately explains the benefits and risks of the Services being provided via telemedicine.  . I have been provided ample opportunity to ask questions regarding this consent and the Services and have had my questions  answered to my satisfaction. . I give my informed consent for the services to be provided through the use of telemedicine in my medical care  By participating in this telemedicine visit I agree to the above.

## 2018-09-28 NOTE — Progress Notes (Signed)
Virtual Visit via Video Note   This visit type was conducted due to national recommendations for restrictions regarding the COVID-19 Pandemic (e.g. social distancing) in an effort to limit this patient's exposure and mitigate transmission in our community.  Due to his co-morbid illnesses, this patient is at least at moderate risk for complications without adequate follow up.  This format is felt to be most appropriate for this patient at this time.  All issues noted in this document were discussed and addressed.  A limited physical exam was performed with this format.  Please refer to the patient's chart for his consent to telehealth for [facility].   Evaluation Performed:  Follow-up visit  Date:  09/28/2018   ID:  Jonathan Burke, DOB May 08, 1961, MRN [id number]  Patient Location: Home Provider Location: Office  PCP:  Ileana Ladd, MD  Cardiologist:  No primary care provider on file.  Electrophysiologist:  None   Chief Complaint:  Second opinion  History of Present Illness:    Jonathan Burke is a 58 y.o. male who is seen as a second opinion regarding a sending aortic aneurysm and nonobstructive coronary artery disease.  The patient has been following up with Dr. Rosemary Holms. He has known history of essential hypertension.  There is family history of sudden death involving his father and brother. The patient reports no recent chest pain.  He had cardiac evaluation in February with CTA of the coronary arteries.  Coronary calcium score was 74.  There was mild nonobstructive LAD disease that was not significant by FFR.  There was incidental finding of a sending aortic aneurysm measured 4.5 cm.  He had a repeat CT of the chest in April and the aneurysm was 4.3 cm.  The notes indicate that he underwent genetic testing for aortopathy which was negative.  Echocardiogram was done.  I was able to review the report but not the actual images.  EF was 50 to 55%.  Mild aortic valve calcifications were  noted but there was no mention of bicuspid aortic valve. The patient was placed on amlodipine but he had edema and then switch to atenolol and lisinopril.  He was also placed on small dose atorvastatin given mild atherosclerosis.  The patient has been doing well with no chest pain or shortness of breath. He is not a smoker.  The patient does not have symptoms concerning for COVID-19 infection (fever, chills, cough, or new shortness of breath).    Past Medical History:  Diagnosis Date  . Blood in urine   . 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 Chest pain   . Complication of anesthesia    states is hard to wake up  . Dental crowns present    also caps  . Dilated aortic root (HCC)   . Enlarged prostate   . GERD (gastroesophageal reflux disease)    daily OTC  . H/O hiatal hernia    pt unsure if hernia or not, but has a knot in his stomach  . Headache(784.0)    tension  . PONV (postoperative nausea and vomiting)   . Small bowel obstruction (HCC) 07/17/2012   Past Surgical History:  Procedure Laterality Date  . APPENDECTOMY    . CARPAL TUNNEL RELEASE  09/09/2011   Procedure: CARPAL TUNNEL RELEASE;  Surgeon: Wyn Forster., MD;  Location: [facility];  Service: Orthopedics;  Laterality: Left;  . CHOLECYSTECTOMY    . DIRECT LARYNGOSCOPY  07/07/2001   suspension  microdirect laryngoscopy with exc. left vocal cord mass  . ESOPHAGOGASTRODUODENOSCOPY N/A 06/05/2013   Procedure: ESOPHAGOGASTRODUODENOSCOPY (EGD);  Surgeon: Vertell Novak., MD;  Location: [facility] ENDOSCOPY;  Service: Endoscopy;  Laterality: N/A;  . LAPAROSCOPIC LYSIS OF ADHESIONS N/A 05/29/2013   Procedure: DIAGNOSTIC LAPAROSCOPY CONVERTED TO EXPLORATORY LAPAROTOMY, LYSIS OF ADHESIONS ;  Surgeon: Shelly Rubenstein, MD;  Location: [facility] OR;  Service: General;  Laterality: N/A;  . LUMBAR LAMINECTOMY/DECOMPRESSION MICRODISCECTOMY  10/29/1999   L5-S1  . SHOULDER SURGERY     left     Current Meds   Medication Sig  . aspirin EC 81 MG tablet Take 81 mg by mouth daily.  Marland Kitchen atenolol (TENORMIN) 25 MG tablet Take 1 tablet (25 mg total) by mouth 2 (two) times daily.  Marland Kitchen atorvastatin (LIPITOR) 10 MG tablet Take 1 tablet (10 mg total) by mouth daily.  . Cholecalciferol (VITAMIN D) 2000 UNITS CAPS Take 4,000 Units by mouth daily.  Marland Kitchen lisinopril (PRINIVIL,ZESTRIL) 10 MG tablet Take 1 tablet (10 mg total) by mouth daily.  . pantoprazole (PROTONIX) 40 MG tablet Take 1 tablet (40 mg total) by mouth daily. (Patient taking differently: Take 20 mg by mouth daily. )  . sildenafil (VIAGRA) 100 MG tablet Take 100 mg by mouth daily as needed for erectile dysfunction.     Allergies:   Adhesive [tape]   Social History   Tobacco Use  . Smoking status: Never Smoker  . Smokeless tobacco: Never Used  Substance Use Topics  . Alcohol use: No  . Drug use: No     Family Hx: The patient's family history includes Anuerysm in his sister; Arthritis in his sister and sister; COPD in his mother; Heart attack in his father; Heart disease in his mother; Hypertension in his brother and sister; Rectal cancer in his mother; Stroke in his sister.  ROS:   Please see the history of present illness.     All other systems reviewed and are negative.   Prior CV studies:   The following studies were reviewed today:  I reviewed recent cardiovascular studies as summarized above  Labs/Other Tests and Data Reviewed:    EKG:  No ECG reviewed.  Recent Labs: 04/13/2018: ALT 45; Hemoglobin 15.1; Platelets 279; TSH 1.760 08/14/2018: BUN 16; Creatinine, Ser 0.91; Potassium 4.8; Sodium 142   Recent Lipid Panel Lab Results  Component Value Date/Time   CHOL 155 09/05/2012 09:25 AM   TRIG 68 09/05/2012 09:25 AM   HDL 43 09/05/2012 09:25 AM   LDLCALC 98 09/05/2012 09:25 AM    Wt Readings from Last 3 Encounters:  09/28/18 219 lb (99.3 kg)  09/25/18 219 lb (99.3 kg)  08/14/18 230 lb (104.3 kg)     Objective:    Vital  Signs:  BP 105/63   Pulse (!) 56   Ht 5\' 8"  (1.727 m)   Wt 219 lb (99.3 kg)   BMI 33.30 kg/m    VITAL SIGNS:  reviewed GEN:  no acute distress EYES:  sclerae anicteric, EOMI - Extraocular Movements Intact RESPIRATORY:  normal respiratory effort, symmetric expansion CARDIOVASCULAR:  no peripheral edema SKIN:  no rash, lesions or ulcers. MUSCULOSKELETAL:  no obvious deformities. NEURO:  alert and oriented x 3, no obvious focal deficit PSYCH:  normal affect  ASSESSMENT & PLAN:    1. Ascending aortic aneurysm: Most recent CT this month showed a measurement of 4.3 cm.  So far there does not seem to be evidence of Marfan's, connective tissue disease or bicuspid aortic  valve.  Thus, the threshold for surgical intervention is 5.5 cm.  His echo will need to be reviewed to ensure that his aortic valve is not bicuspid given that there was a mention of mild calcifications.  Otherwise, I agree with repeat CT imaging in April 2021.  I agree with no heavy lifting of more than 20 pounds 2. Essential hypertension: Blood pressure seems to be well controlled. 3. Hyperlipidemia: His cholesterol is usually not high but he was started on atorvastatin due to coronary atherosclerosis noted on CTA. 4. I explained to him that I agree with his cardiac management so for but the patient wants to follow-up with me.  This is based on the recommendation of his daughter who worked with me at [facility] before.  COVID-19 Education: The signs and symptoms of COVID-19 were discussed with the patient and how to seek care for testing (follow up with PCP or arrange E-visit).  The importance of social distancing was discussed today.  Time:   Today, I have spent 30 minutes with the patient with telehealth technology discussing the above problems.     Medication Adjustments/Labs and Tests Ordered: Current medicines are reviewed at length with the patient today.  Concerns regarding medicines are outlined above.   Tests Ordered:  No orders of the defined types were placed in this encounter.   Medication Changes: No orders of the defined types were placed in this encounter.   Disposition:  Follow up in 4 month(s)  Signed, Lorine BearsMuhammad Makenley Shimp, MD  09/28/2018 12:17 PM     [facility]

## 2018-09-28 NOTE — Patient Instructions (Signed)
Medication Instructions:  No change in medications If you need a refill on your cardiac medications before your next appointment, please call your pharmacy.   Lab work: None If you have labs (blood work) drawn today and your tests are completely normal, you will receive your results only by: Marland Kitchen MyChart Message (if you have MyChart) OR . A paper copy in the mail If you have any lab test that is abnormal or we need to change your treatment, we will call you to review the results.  Testing/Procedures: None  Follow-Up: At [facility], you and your health needs are our priority.  As part of our continuing mission to provide you with exceptional heart care, we have created designated Provider Care Teams.  These Care Teams include your primary Cardiologist (physician) and Advanced Practice Providers (APPs -  Physician Assistants and Nurse Practitioners) who all work together to provide you with the care you need, when you need it. You will need a follow up appointment in 4 months.

## 2018-09-29 ENCOUNTER — Other Ambulatory Visit: Payer: Self-pay | Admitting: Cardiology

## 2018-09-29 DIAGNOSIS — I10 Essential (primary) hypertension: Secondary | ICD-10-CM

## 2018-09-29 DIAGNOSIS — I2510 Atherosclerotic heart disease of native coronary artery without angina pectoris: Secondary | ICD-10-CM

## 2018-09-29 NOTE — Telephone Encounter (Signed)
Please fill

## 2018-10-01 DIAGNOSIS — G4733 Obstructive sleep apnea (adult) (pediatric): Secondary | ICD-10-CM | POA: Diagnosis not present

## 2018-10-02 ENCOUNTER — Ambulatory Visit: Payer: BLUE CROSS/BLUE SHIELD | Admitting: Cardiology

## 2018-10-20 ENCOUNTER — Telehealth: Payer: Self-pay

## 2018-10-20 NOTE — Telephone Encounter (Signed)
Spouse Rene Kocher aware that you will call. [phone]

## 2018-10-20 NOTE — Telephone Encounter (Signed)
Spouse called stating that they discussed with you at [facility] about pt having issues with statin-bodyaches. These have continued and pt would like to know if he can be switched to something different. Please advise.//ah

## 2018-10-31 ENCOUNTER — Telehealth: Payer: Self-pay | Admitting: *Deleted

## 2018-10-31 NOTE — Telephone Encounter (Signed)
A message was left, re: follow up visit. 

## 2018-11-01 DIAGNOSIS — G4733 Obstructive sleep apnea (adult) (pediatric): Secondary | ICD-10-CM | POA: Diagnosis not present

## 2018-11-03 DIAGNOSIS — G4733 Obstructive sleep apnea (adult) (pediatric): Secondary | ICD-10-CM | POA: Diagnosis not present

## 2018-11-03 DIAGNOSIS — M25532 Pain in left wrist: Secondary | ICD-10-CM | POA: Diagnosis not present

## 2018-11-03 DIAGNOSIS — I77810 Thoracic aortic ectasia: Secondary | ICD-10-CM | POA: Diagnosis not present

## 2018-11-03 DIAGNOSIS — I10 Essential (primary) hypertension: Secondary | ICD-10-CM | POA: Diagnosis not present

## 2018-11-03 DIAGNOSIS — M25531 Pain in right wrist: Secondary | ICD-10-CM | POA: Diagnosis not present

## 2018-12-01 DIAGNOSIS — G4733 Obstructive sleep apnea (adult) (pediatric): Secondary | ICD-10-CM | POA: Diagnosis not present

## 2018-12-07 DIAGNOSIS — M170 Bilateral primary osteoarthritis of knee: Secondary | ICD-10-CM | POA: Diagnosis not present

## 2018-12-07 DIAGNOSIS — M25551 Pain in right hip: Secondary | ICD-10-CM | POA: Diagnosis not present

## 2018-12-07 DIAGNOSIS — M1712 Unilateral primary osteoarthritis, left knee: Secondary | ICD-10-CM | POA: Diagnosis not present

## 2018-12-13 DIAGNOSIS — M1711 Unilateral primary osteoarthritis, right knee: Secondary | ICD-10-CM | POA: Diagnosis not present

## 2018-12-13 DIAGNOSIS — M25661 Stiffness of right knee, not elsewhere classified: Secondary | ICD-10-CM | POA: Diagnosis not present

## 2018-12-26 ENCOUNTER — Other Ambulatory Visit: Payer: Self-pay | Admitting: Cardiology

## 2018-12-26 DIAGNOSIS — I10 Essential (primary) hypertension: Secondary | ICD-10-CM

## 2018-12-28 NOTE — Progress Notes (Signed)
I agree with the assessment and plan as directed by NP on this visit . I was available for consultation.  Glad the headaches responded ! Mikolaj Woolstenhulme, MD

## 2019-01-01 DIAGNOSIS — G4733 Obstructive sleep apnea (adult) (pediatric): Secondary | ICD-10-CM | POA: Diagnosis not present

## 2019-01-03 ENCOUNTER — Other Ambulatory Visit: Payer: Self-pay | Admitting: Cardiology

## 2019-01-03 DIAGNOSIS — I10 Essential (primary) hypertension: Secondary | ICD-10-CM

## 2019-01-03 DIAGNOSIS — I2510 Atherosclerotic heart disease of native coronary artery without angina pectoris: Secondary | ICD-10-CM

## 2019-01-09 ENCOUNTER — Other Ambulatory Visit: Payer: Self-pay

## 2019-01-09 ENCOUNTER — Ambulatory Visit: Payer: BC Managed Care – PPO | Admitting: Cardiovascular Disease

## 2019-01-09 ENCOUNTER — Encounter: Payer: Self-pay | Admitting: Cardiovascular Disease

## 2019-01-09 VITALS — BP 102/64 | HR 65 | Temp 97.9°F | Ht 68.0 in | Wt 226.4 lb

## 2019-01-09 DIAGNOSIS — I10 Essential (primary) hypertension: Secondary | ICD-10-CM

## 2019-01-09 DIAGNOSIS — Z01810 Encounter for preprocedural cardiovascular examination: Secondary | ICD-10-CM

## 2019-01-09 DIAGNOSIS — I712 Thoracic aortic aneurysm, without rupture: Secondary | ICD-10-CM | POA: Diagnosis not present

## 2019-01-09 DIAGNOSIS — I2510 Atherosclerotic heart disease of native coronary artery without angina pectoris: Secondary | ICD-10-CM

## 2019-01-09 DIAGNOSIS — I7121 Aneurysm of the ascending aorta, without rupture: Secondary | ICD-10-CM

## 2019-01-09 MED ORDER — ATENOLOL 25 MG PO TABS: 25.0000 mg | ORAL_TABLET | Freq: Every day | ORAL | 11 refills | Status: DC

## 2019-01-09 NOTE — Patient Instructions (Signed)
Medication Instructions:  Decrease Atenolol 25 mg to once a day  If you need a refill on your cardiac medications before your next appointment, please call your pharmacy.   Lab work: None  Testing/Procedures: None  Follow-Up: At [facility], you and your health needs are our priority.  As part of our continuing mission to provide you with exceptional heart care, we have created designated Provider Care Teams.  These Care Teams include your primary Cardiologist (physician) and Advanced Practice Providers (APPs -  Physician Assistants and Nurse Practitioners) who all work together to provide you with the care you need, when you need it. You will need a follow up appointment in 6 months.  Please call our office 2 months in advance to schedule this appointment.  You may see Dr. Fletcher Anon or one of the following Advanced Practice Providers on your designated Care Team:   Kerin Ransom, PA-C Roby Lofts, [place] . Sande Rives, PA-C  Any Other Special Instructions Will Be Listed Below (If Applicable).

## 2019-01-09 NOTE — Progress Notes (Signed)
Cardiology Office Note   Date:  01/09/2019   ID:  Jonathan Burke, DOB 04/17/1961, MRN [id number]  PCP:  Ileana LaddWong, Francis P, MD  Cardiologist:   Lorine BearsMuhammad Harmony Sandell, MD   No chief complaint on file.     History of Present Illness: Jonathan Burke is a 58 y.o. male who presents for a follow-up visit regarding ascending aortic aneurysm. He has known history of essential hypertension.  There is family history of sudden death involving his father and brother. He had cardiac evaluation in February with CTA of the coronary arteries.  Coronary calcium score was 74.  There was mild nonobstructive LAD disease that was not significant by FFR.  There was incidental finding of ascending aortic aneurysm measured 4.5 cm.  He had a repeat CT of the chest in April and the aneurysm was 4.3 cm.  The notes indicate that he underwent genetic testing for aortopathy which was negative.  Echocardiogram was done.  I was able to review the report but not the actual images.  EF was 50 to 55%.  Mild aortic valve calcifications were noted but there was no mention of bicuspid aortic valve. The patient was placed on amlodipine but he had edema and then switch to atenolol and lisinopril.  He was also placed on small dose atorvastatin given mild atherosclerosis.   He is not a smoker.  He stopped taking atorvastatin due to severe myalgia.  He has been doing well otherwise and denies any chest pain or shortness of breath.  He does complain of orthostatic dizziness since she has been on antihypertensive medications.  He needs to have knee replacement in the near future.    Past Medical History:  Diagnosis Date  . Blood in urine   . 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 Chest pain   . Complication of anesthesia    states is hard to wake up  . Dental crowns present    also caps  . Dilated aortic root (HCC)   . Enlarged prostate   . GERD (gastroesophageal reflux disease)    daily OTC  . H/O hiatal hernia    pt  unsure if hernia or not, but has a knot in his stomach  . Headache(784.0)    tension  . PONV (postoperative nausea and vomiting)   . Small bowel obstruction (HCC) 07/17/2012    Past Surgical History:  Procedure Laterality Date  . APPENDECTOMY    . CARPAL TUNNEL RELEASE  09/09/2011   Procedure: CARPAL TUNNEL RELEASE;  Surgeon: Wyn Forsterobert V Sypher Jr., MD;  Location: [facility];  Service: Orthopedics;  Laterality: Left;  . CHOLECYSTECTOMY    . 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 ESOPHAGOGASTRODUODENOSCOPY N/A 06/05/2013   Procedure: ESOPHAGOGASTRODUODENOSCOPY (EGD);  Surgeon: Vertell NovakJames L Edwards Jr., MD;  [facility];  Service: Endoscopy;  Laterality: N/A;  . LAPAROSCOPIC LYSIS OF ADHESIONS N/A 05/29/2013   Procedure: DIAGNOSTIC LAPAROSCOPY CONVERTED TO EXPLORATORY LAPAROTOMY, LYSIS OF ADHESIONS ;  Surgeon: Shelly Rubensteinouglas A Blackman, MD;  Location: [facility] OR;  Service: General;  Laterality: N/A;  . LUMBAR LAMINECTOMY/DECOMPRESSION MICRODISCECTOMY  10/29/1999   L5-S1  . SHOULDER SURGERY     left     Current Outpatient Medications  Medication Sig Dispense Refill  . aspirin EC 81 MG tablet Take 81 mg by mouth daily.    Marland Kitchen. atenolol (TENORMIN) 25 MG tablet Take 1 tablet (25 mg total) by  mouth daily. 30 tablet 11  . Cholecalciferol (VITAMIN D) 2000 UNITS CAPS Take 4,000 Units by mouth daily.    Marland Kitchen. lisinopril (ZESTRIL) 10 MG tablet TAKE ONE (1) TABLET EACH DAY 30 tablet 2  . pantoprazole (PROTONIX) 40 MG tablet Take 1 tablet (40 mg total) by mouth daily. (Patient taking differently: Take 20 mg by mouth daily. ) 30 tablet 1  . sildenafil (VIAGRA) 100 MG tablet Take 20 mg by mouth daily as needed for erectile dysfunction.     Marland Kitchen. atorvastatin (LIPITOR) 10 MG tablet TAKE ONE (1) TABLET EACH DAY (Patient not taking: Reported on 01/09/2019) 30 tablet 2   No current facility-administered medications for this visit.     Allergies:    Adhesive [tape]    Social History:  The patient  reports that he has never smoked. He has never used smokeless tobacco. He reports that he does not drink alcohol or use drugs.   Family History:  The patient's family history includes Anuerysm in his sister; Arthritis in his sister and sister; COPD in his mother; Heart attack in his father; Heart disease in his mother; Hypertension in his brother and sister; Rectal cancer in his mother; Stroke in his sister.    ROS:  Please see the history of present illness.   Otherwise, review of systems are positive for none.   All other systems are reviewed and negative.    PHYSICAL EXAM: VS:  BP 102/64   Pulse 65   Temp 97.9 F (36.6 C)   Ht 5\' 8"  (1.727 m)   Wt 226 lb 6.4 oz (102.7 kg)   SpO2 97%   BMI 34.42 kg/m  , BMI Body mass index is 34.42 kg/m. GEN: Well nourished, well developed, in no acute distress  HEENT: normal  Neck: no JVD, carotid bruits, or masses Cardiac: RRR; no murmurs, rubs, or gallops,no edema  Respiratory:  clear to auscultation bilaterally, normal work of breathing GI: soft, nontender, nondistended, + BS MS: no deformity or atrophy  Skin: warm and dry, no rash Neuro:  Strength and sensation are intact Psych: euthymic mood, full affect   EKG:  EKG is ordered today. The ekg ordered today demonstrates sinus bradycardia with no significant ST or T wave changes.  Heart rate is 58 bpm.   Recent Labs: 04/13/2018: ALT 45; Hemoglobin 15.1; Platelets 279; TSH 1.760 08/14/2018: BUN 16; Creatinine, Ser 0.91; Potassium 4.8; Sodium 142    Lipid Panel    Component Value Date/Time   CHOL 155 09/05/2012 0925   TRIG 68 09/05/2012 0925   HDL 43 09/05/2012 0925   LDLCALC 98 09/05/2012 0925      Wt Readings from Last 3 Encounters:  01/09/19 226 lb 6.4 oz (102.7 kg)  09/28/18 219 lb (99.3 kg)  09/25/18 219 lb (99.3 kg)       No flowsheet data found.    ASSESSMENT AND PLAN:  1. Ascending aortic aneurysm: Most  recent CT this month showed a measurement of 4.3 cm.  No evidence of Marfan's, connective tissue disease or bicuspid aortic valve.  Thus, the threshold for surgical intervention is 5.5 cm.  No murmurs by physical exam. Repeat CT imaging in April 2021.  I agree with no heavy lifting of more than 20 pounds 2. Essential hypertension: Blood pressure is on the low side with mild bradycardia and symptoms of orthostatic dizziness.  I elected to decrease atenolol to 25 mg once daily. 3. Hyperlipidemia: His cholesterol is usually not high but he was  started on atorvastatin due to coronary atherosclerosis noted on CTA.  He did not tolerate atorvastatin due to severe myalgia.  We can consider a different statin in the future. 4. Preop cardiovascular evaluation for knee surgery: The patient has no cardiac symptoms.  CTA this year showed no obstructive coronary artery disease.  His EKG is normal.  He can proceed with surgery at an overall low risk.    Disposition:   FU with me in 6 months  Signed,  Kathlyn Sacramento, MD  01/09/2019 4:52 PM    [place]

## 2019-01-12 DIAGNOSIS — K219 Gastro-esophageal reflux disease without esophagitis: Secondary | ICD-10-CM | POA: Diagnosis not present

## 2019-01-12 DIAGNOSIS — N529 Male erectile dysfunction, unspecified: Secondary | ICD-10-CM | POA: Diagnosis not present

## 2019-01-12 DIAGNOSIS — Z01818 Encounter for other preprocedural examination: Secondary | ICD-10-CM | POA: Diagnosis not present

## 2019-01-12 DIAGNOSIS — I77810 Thoracic aortic ectasia: Secondary | ICD-10-CM | POA: Diagnosis not present

## 2019-01-16 DIAGNOSIS — Z96651 Presence of right artificial knee joint: Secondary | ICD-10-CM | POA: Diagnosis not present

## 2019-01-16 DIAGNOSIS — M1711 Unilateral primary osteoarthritis, right knee: Secondary | ICD-10-CM | POA: Diagnosis not present

## 2019-02-01 DIAGNOSIS — G4733 Obstructive sleep apnea (adult) (pediatric): Secondary | ICD-10-CM | POA: Diagnosis not present

## 2019-02-20 DIAGNOSIS — Z96651 Presence of right artificial knee joint: Secondary | ICD-10-CM | POA: Diagnosis not present

## 2019-02-20 DIAGNOSIS — M1712 Unilateral primary osteoarthritis, left knee: Secondary | ICD-10-CM | POA: Diagnosis not present

## 2019-02-20 DIAGNOSIS — M1711 Unilateral primary osteoarthritis, right knee: Secondary | ICD-10-CM | POA: Diagnosis not present

## 2019-03-01 DIAGNOSIS — M1712 Unilateral primary osteoarthritis, left knee: Secondary | ICD-10-CM | POA: Diagnosis not present

## 2019-03-03 DIAGNOSIS — G4733 Obstructive sleep apnea (adult) (pediatric): Secondary | ICD-10-CM | POA: Diagnosis not present

## 2019-03-08 DIAGNOSIS — M1712 Unilateral primary osteoarthritis, left knee: Secondary | ICD-10-CM | POA: Diagnosis not present

## 2019-03-26 ENCOUNTER — Ambulatory Visit: Payer: BLUE CROSS/BLUE SHIELD | Admitting: Adult Health

## 2019-03-26 ENCOUNTER — Encounter: Payer: Self-pay | Admitting: Adult Health

## 2019-03-27 DIAGNOSIS — M24661 Ankylosis, right knee: Secondary | ICD-10-CM | POA: Diagnosis not present

## 2019-03-28 ENCOUNTER — Other Ambulatory Visit: Payer: Self-pay | Admitting: Cardiology

## 2019-03-28 DIAGNOSIS — I10 Essential (primary) hypertension: Secondary | ICD-10-CM

## 2019-04-02 DIAGNOSIS — M1711 Unilateral primary osteoarthritis, right knee: Secondary | ICD-10-CM | POA: Diagnosis not present

## 2019-04-03 DIAGNOSIS — M1711 Unilateral primary osteoarthritis, right knee: Secondary | ICD-10-CM | POA: Diagnosis not present

## 2019-04-03 DIAGNOSIS — G4733 Obstructive sleep apnea (adult) (pediatric): Secondary | ICD-10-CM | POA: Diagnosis not present

## 2019-04-05 DIAGNOSIS — M1711 Unilateral primary osteoarthritis, right knee: Secondary | ICD-10-CM | POA: Diagnosis not present

## 2019-04-06 DIAGNOSIS — M1711 Unilateral primary osteoarthritis, right knee: Secondary | ICD-10-CM | POA: Diagnosis not present

## 2019-04-09 DIAGNOSIS — M1711 Unilateral primary osteoarthritis, right knee: Secondary | ICD-10-CM | POA: Diagnosis not present

## 2019-04-11 DIAGNOSIS — M1711 Unilateral primary osteoarthritis, right knee: Secondary | ICD-10-CM | POA: Diagnosis not present

## 2019-04-28 ENCOUNTER — Other Ambulatory Visit: Payer: Self-pay | Admitting: Cardiology

## 2019-04-28 DIAGNOSIS — I10 Essential (primary) hypertension: Secondary | ICD-10-CM

## 2019-05-15 DIAGNOSIS — I10 Essential (primary) hypertension: Secondary | ICD-10-CM | POA: Diagnosis not present

## 2019-05-15 DIAGNOSIS — Z6833 Body mass index (BMI) 33.0-33.9, adult: Secondary | ICD-10-CM | POA: Diagnosis not present

## 2019-05-15 DIAGNOSIS — I77810 Thoracic aortic ectasia: Secondary | ICD-10-CM | POA: Diagnosis not present

## 2019-05-15 DIAGNOSIS — E669 Obesity, unspecified: Secondary | ICD-10-CM | POA: Diagnosis not present

## 2019-05-15 DIAGNOSIS — Z713 Dietary counseling and surveillance: Secondary | ICD-10-CM | POA: Diagnosis not present

## 2019-05-15 DIAGNOSIS — N529 Male erectile dysfunction, unspecified: Secondary | ICD-10-CM | POA: Diagnosis not present

## 2019-05-15 DIAGNOSIS — K219 Gastro-esophageal reflux disease without esophagitis: Secondary | ICD-10-CM | POA: Diagnosis not present

## 2019-05-22 DIAGNOSIS — G4733 Obstructive sleep apnea (adult) (pediatric): Secondary | ICD-10-CM | POA: Diagnosis not present

## 2019-06-15 ENCOUNTER — Encounter (HOSPITAL_BASED_OUTPATIENT_CLINIC_OR_DEPARTMENT_OTHER): Payer: Self-pay

## 2019-06-15 ENCOUNTER — Emergency Department (HOSPITAL_BASED_OUTPATIENT_CLINIC_OR_DEPARTMENT_OTHER): Payer: BC Managed Care – PPO

## 2019-06-15 ENCOUNTER — Other Ambulatory Visit: Payer: Self-pay

## 2019-06-15 ENCOUNTER — Emergency Department (HOSPITAL_BASED_OUTPATIENT_CLINIC_OR_DEPARTMENT_OTHER)
Admission: EM | Admit: 2019-06-15 | Discharge: 2019-06-15 | Disposition: A | Discharge status: Home / Self Care | Payer: BC Managed Care – PPO | Attending: Emergency Medicine | Admitting: Emergency Medicine

## 2019-06-15 DIAGNOSIS — E782 Mixed hyperlipidemia: Secondary | ICD-10-CM | POA: Insufficient documentation

## 2019-06-15 DIAGNOSIS — Z79899 Other long term (current) drug therapy: Secondary | ICD-10-CM | POA: Diagnosis not present

## 2019-06-15 DIAGNOSIS — R140 Abdominal distension (gaseous): Secondary | ICD-10-CM | POA: Diagnosis not present

## 2019-06-15 DIAGNOSIS — Z20822 Contact with and (suspected) exposure to covid-19: Secondary | ICD-10-CM | POA: Diagnosis not present

## 2019-06-15 DIAGNOSIS — R110 Nausea: Secondary | ICD-10-CM

## 2019-06-15 DIAGNOSIS — I2510 Atherosclerotic heart disease of native coronary artery without angina pectoris: Secondary | ICD-10-CM | POA: Insufficient documentation

## 2019-06-15 DIAGNOSIS — Z91048 Other nonmedicinal substance allergy status: Secondary | ICD-10-CM | POA: Diagnosis not present

## 2019-06-15 DIAGNOSIS — K625 Hemorrhage of anus and rectum: Secondary | ICD-10-CM | POA: Diagnosis not present

## 2019-06-15 DIAGNOSIS — R109 Unspecified abdominal pain: Secondary | ICD-10-CM | POA: Diagnosis not present

## 2019-06-15 DIAGNOSIS — Z7982 Long term (current) use of aspirin: Secondary | ICD-10-CM | POA: Insufficient documentation

## 2019-06-15 DIAGNOSIS — R1084 Generalized abdominal pain: Secondary | ICD-10-CM

## 2019-06-15 DIAGNOSIS — R112 Nausea with vomiting, unspecified: Secondary | ICD-10-CM | POA: Diagnosis not present

## 2019-06-15 LAB — COMPREHENSIVE METABOLIC PANEL
ALT: 39 U/L (ref 0–44)
AST: 27 U/L (ref 15–41)
Albumin: 4.8 g/dL (ref 3.5–5.0)
Alkaline Phosphatase: 47 U/L (ref 38–126)
Anion gap: 10 (ref 5–15)
BUN: 18 mg/dL (ref 6–20)
CO2: 26 mmol/L (ref 22–32)
Calcium: 9.3 mg/dL (ref 8.9–10.3)
Chloride: 101 mmol/L (ref 98–111)
Creatinine, Ser: 0.78 mg/dL (ref 0.61–1.24)
GFR calc Af Amer: >60 mL/min (ref >60)
GFR calc non Af Amer: >60 mL/min (ref >60)
Glucose, Bld: 92 mg/dL (ref 70–99)
Potassium: 4.1 mmol/L (ref 3.5–5.1)
Sodium: 137 mmol/L (ref 135–145)
Total Bilirubin: 1.1 mg/dL (ref 0.3–1.2)
Total Protein: 8.7 g/dL — ABNORMAL HIGH (ref 6.5–8.1)

## 2019-06-15 LAB — URINALYSIS, ROUTINE W REFLEX MICROSCOPIC
Bilirubin Urine: NEGATIVE
Glucose, UA: NEGATIVE mg/dL
Hgb urine dipstick: NEGATIVE
Ketones, ur: 15 mg/dL — AB
Leukocytes,Ua: NEGATIVE
Nitrite: NEGATIVE
Protein, ur: NEGATIVE mg/dL
Specific Gravity, Urine: >1.03 — ABNORMAL HIGH (ref 1.005–1.030)
pH: 6 (ref 5.0–8.0)

## 2019-06-15 LAB — CBC
HCT: 46.4 % (ref 39.0–52.0)
Hemoglobin: 15.4 g/dL (ref 13.0–17.0)
MCH: 30.7 pg (ref 26.0–34.0)
MCHC: 33.2 g/dL (ref 30.0–36.0)
MCV: 92.4 fL (ref 80.0–100.0)
Platelets: 350 10*3/uL (ref 150–400)
RBC: 5.02 MIL/uL (ref 4.22–5.81)
RDW: 12.7 % (ref 11.5–15.5)
WBC: 7 10*3/uL (ref 4.0–10.5)
nRBC: 0 % (ref 0.0–0.2)

## 2019-06-15 LAB — LIPASE, BLOOD: Lipase: 34 U/L (ref 11–51)

## 2019-06-15 MED ORDER — IOHEXOL 300 MG/ML  SOLN
100.0000 mL | Freq: Once | INTRAMUSCULAR | Status: AC | PRN
  Administered 2019-06-15: 100 mL via INTRAVENOUS

## 2019-06-15 MED ORDER — MORPHINE SULFATE (PF) 4 MG/ML IV SOLN
4.0000 mg | Freq: Once | INTRAVENOUS | Status: AC
  Administered 2019-06-15: 4 mg via INTRAVENOUS
  Filled 2019-06-15: qty 1

## 2019-06-15 MED ORDER — SODIUM CHLORIDE 0.9% FLUSH
3.0000 mL | Freq: Once | INTRAVENOUS | Status: DC
  Filled 2019-06-15: qty 3

## 2019-06-15 MED ORDER — ONDANSETRON HCL 4 MG/2ML IJ SOLN
4.0000 mg | Freq: Once | INTRAMUSCULAR | Status: AC
  Administered 2019-06-15: 4 mg via INTRAVENOUS
  Filled 2019-06-15: qty 2

## 2019-06-15 MED ORDER — HYDROCODONE-ACETAMINOPHEN 5-325 MG PO TABS
1.0000 | ORAL_TABLET | Freq: Once | ORAL | Status: AC
  Administered 2019-06-15: 1 via ORAL
  Filled 2019-06-15: qty 1

## 2019-06-15 NOTE — ED Provider Notes (Signed)
[facility] EMERGENCY DEPARTMENT Provider Note   CSN: [id number] Arrival date & time: 06/15/19  1751     History Chief Complaint  Patient presents with  . Abdominal Pain    Jonathan Burke is a 59 y.o. male past medical history of GERD, small bowel obstruction who presents for evaluation of abdominal pain, nausea, decreased appetite, loose stools.  He reports that this started about 3 days ago.  He states that when he ate dinner, he felt like it did not settle right.  He states he felt like he was going to vomit but never actually did.  He states that since then, he has not wanted to eat or drink much.  He states that he felt like his appetite was getting decreased and his abdomen was coming more distended.  He was also having some diffuse abdominal pain.  He states he has continued to be nauseous but denies any vomiting.  He states that his stools are very loose and are just like liquid.  He has not had a solid bowel movement since 4 days ago.  He states he still passing gas.  He is concerned because he has history of bowel obstructions in the past and was concerned that this may be similar.  He denies any fevers, chest pain, difficulty breathing, dysuria, hematuria.  He does have a history of abdominal surgery.   The history is provided by the patient.       Past Medical History:  Diagnosis Date  . Blood in urine   . 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 Chest pain   . Complication of anesthesia    states is hard to wake up  . Dental crowns present    also caps  . Dilated aortic root (HCC)   . Enlarged prostate   . GERD (gastroesophageal reflux disease)    daily OTC  . H/O hiatal hernia    pt unsure if hernia or not, but has a knot in his stomach  . Headache(784.0)    tension  . PONV (postoperative nausea and vomiting)   . Small bowel obstruction (HCC) 07/17/2012    Patient Active Problem List   Diagnosis Date Noted  . Thoracic aortic aneurysm without  rupture (HCC) 09/25/2018  . Essential hypertension 07/31/2018  . Coronary artery disease involving native coronary artery of native heart without angina pectoris 07/31/2018  . Aneurysm, ascending aorta (HCC) 07/31/2018  . Excessive daytime sleepiness 07/14/2018  . Snoring 07/14/2018  . Pulsatile tinnitus 07/14/2018  . Sleep apnea 07/14/2018  . Morning headache 07/14/2018  . OSA (obstructive sleep apnea) 04/13/2018  . Tinnitus 04/13/2018  . Partial small bowel obstruction (HCC) 05/21/2015  . Dyslipidemia (high LDL; low HDL) 09/05/2012  . BPH (benign prostatic hyperplasia) 09/05/2012  . SBO (small bowel obstruction) (HCC) 07/17/2012  . GERD (gastroesophageal reflux disease) 07/17/2012  . Obesity (BMI 30-39.9) 07/17/2012    Past Surgical History:  Procedure Laterality Date  . APPENDECTOMY    . CARPAL TUNNEL RELEASE  09/09/2011   Procedure: CARPAL TUNNEL RELEASE;  Surgeon: Wyn Forster., MD;  Location: [facility];  Service: Orthopedics;  Laterality: Left;  . CHOLECYSTECTOMY    . 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 ESOPHAGOGASTRODUODENOSCOPY N/A 06/05/2013   Procedure: ESOPHAGOGASTRODUODENOSCOPY (EGD);  Surgeon: Vertell Novak., MD;  Location: [facility] END[facility]COPY;  Service: Endoscopy;  Laterality: N/A;  . LAPAROSCOPIC LYSIS OF ADHESIONS  N/A 05/29/2013   Procedure: DIAGNOSTIC LAPAROSCOPY CONVERTED TO EXPLORATORY LAPAROTOMY, LYSIS OF ADHESIONS ;  Surgeon: Harl Bowie, MD;  Location: [place];  Service: General;  Laterality: N/A;  . LUMBAR LAMINECTOMY/DECOMPRESSION MICRODISCECTOMY  10/29/1999   L5-S1  . SHOULDER SURGERY     left       Family History  Problem Relation Age of Onset  . COPD Mother   . Heart disease Mother   . Rectal cancer Mother   . Heart attack Father   . Arthritis Sister   . Hypertension Brother   . Hypertension Sister   . Anuerysm Sister   . Stroke Sister   . Arthritis  Sister        RA    Social History   Tobacco Use  . Smoking status: Never Smoker  . Smokeless tobacco: Never Used  Substance Use Topics  . Alcohol use: No  . Drug use: No    Home Medications Prior to Admission medications   Medication Sig Start Date End Date Taking? Authorizing Provider  aspirin EC 81 MG tablet Take 81 mg by mouth daily.    [provider]  atenolol (TENORMIN) 25 MG tablet Take 1 tablet (25 mg total) by mouth daily. 01/09/19   Wellington Hampshire, MD  atorvastatin (LIPITOR) 10 MG tablet TAKE ONE (1) TABLET EACH DAY Patient not taking: Reported on 01/09/2019 09/29/18   Nigel Mormon, MD  Cholecalciferol (VITAMIN D) 2000 UNITS CAPS Take 4,000 Units by mouth daily.    [provider]  lisinopril (ZESTRIL) 10 MG tablet TAKE ONE (1) TABLET EACH DAY 04/30/19   Patwardhan, Manish J, MD  pantoprazole (PROTONIX) 40 MG tablet Take 1 tablet (40 mg total) by mouth daily. Patient taking differently: Take 20 mg by mouth daily.  06/05/13   Coralie Keens, MD  sildenafil (VIAGRA) 100 MG tablet Take 20 mg by mouth daily as needed for erectile dysfunction.     [provider]    Allergies    Adhesive [tape]  Review of Systems   Review of Systems  Constitutional: Negative for fever.  Respiratory: Negative for cough and shortness of breath.   Cardiovascular: Negative for chest pain.  Gastrointestinal: Positive for abdominal pain, diarrhea and nausea. Negative for vomiting.  Genitourinary: Negative for dysuria and hematuria.  Neurological: Negative for headaches.  All other systems reviewed and are negative.   Physical Exam Updated Vital Signs BP 118/68 (BP Location: Left Arm)   Pulse 66   Temp 98.1 F (36.7 C) (Oral)   Resp 16   Ht 5\' 8"  (1.727 m)   Wt 98.9 kg   SpO2 99%   BMI 33.15 kg/m   Physical Exam Vitals and nursing note reviewed.  Constitutional:      Appearance: Normal appearance. He is well-developed.  HENT:     Head:  Normocephalic and atraumatic.  Eyes:     General: Lids are normal.     Conjunctiva/sclera: Conjunctivae normal.     Pupils: Pupils are equal, round, and reactive to light.  Cardiovascular:     Rate and Rhythm: Normal rate and regular rhythm.     Pulses: Normal pulses.     Heart sounds: Normal heart sounds. No murmur. No friction rub. No gallop.   Pulmonary:     Effort: Pulmonary effort is normal.     Breath sounds: Normal breath sounds.  Abdominal:     General: Bowel sounds are decreased. There is distension.     Palpations: Abdomen  is soft. Abdomen is not rigid.     Tenderness: There is generalized abdominal tenderness. There is no guarding.     Hernia: A hernia is present. Hernia is present in the ventral area.     Comments: Ventral hernia noted.  No overlying warmth, erythema, edema.  Abdomen slightly distended.  Diffuse tenderness with no focal point.  Decreased bowel sounds noted.  Musculoskeletal:        General: Normal range of motion.     Cervical back: Full passive range of motion without pain.  Skin:    General: Skin is warm and dry.     Capillary Refill: Capillary refill takes less than 2 seconds.  Neurological:     Mental Status: He is alert and oriented to person, place, and time.  Psychiatric:        Speech: Speech normal.     ED Results / Procedures / Treatments   Labs (all labs ordered are listed, but only abnormal results are displayed) Labs Reviewed  COMPREHENSIVE METABOLIC PANEL - Abnormal; Notable for the following components:      Result Value   Total Protein 8.7 (*)    All other components within normal limits  URINALYSIS, ROUTINE W REFLEX MICROSCOPIC - Abnormal; Notable for the following components:   APPearance HAZY (*)    Specific Gravity, Urine >1.030 (*)    Ketones, ur 15 (*)    All other components within normal limits  SARS CORONAVIRUS 2 (TAT 6-24 HRS)  LIPASE, BLOOD  CBC    EKG None  Radiology CT ABDOMEN PELVIS W CONTRAST  Result  Date: 06/15/2019 CLINICAL DATA:  Abdominal pain for several days EXAM: CT ABDOMEN AND PELVIS WITH CONTRAST TECHNIQUE: Multidetector CT imaging of the abdomen and pelvis was performed using the standard protocol following bolus administration of intravenous contrast. CONTRAST:  OMNIPAQUE IOHEXOL 300 MG/ML  SOLN COMPARISON:  07/06/2016 FINDINGS: Lower chest: No acute abnormality. Hepatobiliary: Fatty infiltration of the liver is noted. The gallbladder has been surgically removed. Pancreas: Unremarkable. No pancreatic ductal dilatation or surrounding inflammatory changes. Spleen: Normal in size without focal abnormality. Adrenals/Urinary Tract: Adrenal glands are within normal limits. Kidneys demonstrate a normal enhancement pattern bilaterally. No renal calculi or obstructive changes are seen. Normal excretion of contrast is noted on delayed images. The bladder is partially distended. Stomach/Bowel: Colon is well visualized and predominately decompressed. No obstructive or inflammatory changes are seen. The appendix has been surgically removed. The stomach and small bowel show no obstructive changes. Vascular/Lymphatic: No significant vascular findings are present. No enlarged abdominal or pelvic lymph nodes. Reproductive: Prostate is unremarkable. Other: No free fluid is noted. Small fat containing supraumbilical hernias are identified. These are stable in appearance from the prior exam. No incarcerated bowel loops are seen. Musculoskeletal: Degenerative changes of the lumbar spine are noted. IMPRESSION: No acute abnormality noted. Fatty liver. Stable fat containing supraumbilical hernias Electronically Signed   By: Alcide Clever M.D.   On: 06/15/2019 20:50    Procedures Procedures (including critical care time)  Medications Ordered in ED Medications  sodium chloride flush (NS) 0.9 % injection 3 mL (3 mLs Intravenous Not Given 06/15/19 2115)  iohexol (OMNIPAQUE) 300 MG/ML solution 100 mL (100 mLs  Intravenous Contrast Given 06/15/19 2026)  morphine 4 MG/ML injection 4 mg (4 mg Intravenous Given 06/15/19 2110)  ondansetron (ZOFRAN) injection 4 mg (4 mg Intravenous Given 06/15/19 2110)  HYDROcodone-acetaminophen (NORCO/VICODIN) 5-325 MG per tablet 1 tablet (1 tablet Oral Given 06/15/19 2327)  ED Course  I have reviewed the triage vital signs and the nursing notes.  Pertinent labs & imaging results that were available during my care of the patient were reviewed by me and considered in my medical decision making (see chart for details).    MDM Rules/Calculators/A&P                       59 year old male who presents for evaluation of abdominal pain, distention, nausea this been ongoing for last 3 days.  Also reports he has had loose stools.  History of obstruction was concerned that might be going on again.  On initial ED arrival, he is afebrile, nontoxic-appearing.  On exam, he does have some slightly distended abdomen with some decreased bowel signs.  No fluid wave that would be concerning for ascites.  He has diffuse tenderness no focal point.  He does have ventral hernia noted but does not appear incarcerated or strangulated.  Concern for infectious etiology versus small bowel obstruction versus constipation.  Exam is not out of proportion to pain.  Doubt ischemic abdomen.  Do not suspect that his hernia is incarcerated or strangulated given overall reassuring appearance.  Labs, imaging ordered at triage.  CMP is unremarkable.  CBC without any significant leukocytosis or anemia.  Lipase is unremarkable.  UA negative for any infectious etiology.  CT on pelvis shows no acute abnormality.  No evidence of bowel obstruction.  He has stable fat-containing supraumbilical hernias.  Reevaluation.  Patient reports improved abdominal pain.  He still appears slightly bloated/distended.  He has ventral hernias there but is soft it is not overlying warmth or erythema.  He has not had any vomiting since  being in the emergency department he is hemodynamically stable.  At this time, given his reassuring work-up and no signs of obstruction on CT scan, I do not feel that he needs admission.  I did discuss with patient that this could be an early ileus/obstruction that is just not showing itself yet.  Patient does feel comfortable with going home.  I instructed him to have a low threshold for returning and that if he has any worsening concerning symptoms, he is to return to the emergency department immediately.  Additionally, given his vague symptoms, will plan outpatient Covid test. Discussed patient with Dr. Donnald Garre who is agreeable to plan. Patient had ample opportunity for questions and discussion. All patient's questions were answered with full understanding.   Portions of this note were generated with Scientist, clinical (histocompatibility and immunogenetics). Dictation errors may occur despite best attempts at proofreading.  Final Clinical Impression(s) / ED Diagnoses Final diagnoses:  Generalized abdominal pain  Nausea    Rx / DC Orders ED Discharge Orders    None       Maxwell Caul, PA-C 06/16/19 0030    Arby Barrette, MD 06/19/19 [phone]

## 2019-06-15 NOTE — Discharge Instructions (Signed)
As we discussed today, your CT scan was reassuring.  As we discussed, closely monitor your symptoms and return to the emergency department for any worsening abdominal pain, inability to eat or drink, vomiting, fevers or any other worsening or concerning symptoms.  You have a Covid test pending.  This will take about 24 hours to come back.  If you are positive, you will need to quarantine for 2 weeks.

## 2019-06-15 NOTE — ED Triage Notes (Signed)
Pt presents with abdominal pain, nausea, decreased appetite, and water like stools. Pt has hx of obstruction in past. Referred by [facility] physician.

## 2019-06-16 LAB — SARS CORONAVIRUS 2 (TAT 6-24 HRS): SARS Coronavirus 2: NEGATIVE

## 2019-06-25 DIAGNOSIS — Z1159 Encounter for screening for other viral diseases: Secondary | ICD-10-CM | POA: Diagnosis not present

## 2019-06-28 DIAGNOSIS — K625 Hemorrhage of anus and rectum: Secondary | ICD-10-CM | POA: Diagnosis not present

## 2019-07-10 ENCOUNTER — Other Ambulatory Visit: Payer: Self-pay

## 2019-07-10 ENCOUNTER — Ambulatory Visit: Payer: BC Managed Care – PPO | Admitting: Cardiovascular Disease

## 2019-07-10 VITALS — BP 110/70 | HR 63 | Temp 97.1°F | Ht 68.0 in | Wt 226.0 lb

## 2019-07-10 DIAGNOSIS — I712 Thoracic aortic aneurysm, without rupture, unspecified: Secondary | ICD-10-CM

## 2019-07-10 DIAGNOSIS — I7121 Aneurysm of the ascending aorta, without rupture: Secondary | ICD-10-CM

## 2019-07-10 DIAGNOSIS — I2510 Atherosclerotic heart disease of native coronary artery without angina pectoris: Secondary | ICD-10-CM | POA: Diagnosis not present

## 2019-07-10 DIAGNOSIS — I10 Essential (primary) hypertension: Secondary | ICD-10-CM | POA: Diagnosis not present

## 2019-07-10 MED ORDER — ROSUVASTATIN CALCIUM 5 MG PO TABS: 5.0000 mg | ORAL_TABLET | Freq: Every day | ORAL | 3 refills | Status: DC

## 2019-07-10 NOTE — Patient Instructions (Addendum)
Medication Instructions:  Your physician has recommended you make the following change in your medication:   STOP TAKING YOUR ATENOLOL.  START ROSUVASTATIN 5 MG BY MOUTH DAILY  *If you need a refill on your cardiac medications before your next appointment, please call your pharmacy*  Lab Work: Your physician recommends that you return for lab work in: 6 WEEKS  FASTING LIPID PROFILE COMPREHENSIVE METABOLIC PANEL  If you have labs (blood work) drawn today and your tests are completely normal, you will receive your results only by: Marland Kitchen MyChart Message (if you have MyChart) OR . A paper copy in the mail If you have any lab test that is abnormal or we need to change your treatment, we will call you to review the results.  Testing/Procedures: Your physician has requested that you have a CTA of your chest and aorta. CT Angiography (CTA), is a special type of CT scan that uses a computer to produce multi-dimensional views of major blood vessels throughout the body. In CT angiography, a contrast material is injected through an IV to help visualize the blood vessels  TO BE SCHEDULED  Follow-Up: At [facility], you and your health needs are our priority.  As part of our continuing mission to provide you with exceptional heart care, we have created designated Provider Care Teams.  These Care Teams include your primary Cardiologist (physician) and Advanced Practice Providers (APPs -  Physician Assistants and Nurse Practitioners) who all work together to provide you with the care you need, when you need it.  Your next appointment:   6 month(s)  The format for your next appointment:   In Person  Provider:   Lorine Bears, MD

## 2019-07-10 NOTE — Progress Notes (Signed)
Cardiology Office Note   Date:  07/10/2019   ID:  Jonathan Burke, DOB [birth year], MRN [id number]  PCP:  Ileana Ladd, MD  Cardiologist:   Lorine Bears, MD   No chief complaint on file.     History of Present Illness: Jonathan Burke is a 59 y.o. male who presents for a follow-up visit regarding ascending aortic aneurysm. He has known history of essential hypertension.  There is family history of sudden death involving his father and brother. He had cardiac evaluation in February with CTA of the coronary arteries.  Coronary calcium score was 74.  There was mild nonobstructive LAD disease that was not significant by FFR.  There was incidental finding of ascending aortic aneurysm measured 4.5 cm.  He had a repeat CT of the chest in April and the aneurysm was 4.3 cm.  The notes indicate that he underwent genetic testing for aortopathy which was negative.  Echocardiogram showed an EF was 50 to 55%.  Mild aortic valve calcifications were noted but there was no mention of bicuspid aortic valve. The patient was placed on amlodipine but he had edema and then switched to atenolol and lisinopril.   He is not a smoker.  He did not tolerate atorvastatin even small dose due to severe myalgia.  During last visit, the dose of atenolol was decreased to 25 mg daily due to orthostatic dizziness.  He reports some improvement but he continues to complain of dizziness with changing positions.  He underwent knee replacement with partial improvement in symptoms. No chest pain or shortness of breath.   Past Medical History:  Diagnosis Date  . Blood in urine   . 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 Chest pain   . Complication of anesthesia    states is hard to wake up  . Dental crowns present    also caps  . Dilated aortic root (HCC)   . Enlarged prostate   . GERD (gastroesophageal reflux disease)    daily OTC  . H/O hiatal hernia    pt unsure if hernia or not, but has a knot in his  stomach  . Headache(784.0)    tension  . PONV (postoperative nausea and vomiting)   . Small bowel obstruction (HCC) 07/17/2012    Past Surgical History:  Procedure Laterality Date  . APPENDECTOMY    . CARPAL TUNNEL RELEASE  09/09/2011   Procedure: CARPAL TUNNEL RELEASE;  Surgeon: Wyn Forster., MD;  Location: [facility];  Service: Orthopedics;  Laterality: Left;  . CHOLECYSTECTOMY    . 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 ESOPHAGOGASTRODUODENOSCOPY N/A 06/05/2013   Procedure: ESOPHAGOGASTRODUODENOSCOPY (EGD);  Surgeon: Vertell Novak., MD;  Location: [facility]COPY;  Service: Endoscopy;  Laterality: N/A;  . LAPAROSCOPIC LYSIS OF ADHESIONS N/A 05/29/2013   Procedure: DIAGNOSTIC LAPAROSCOPY CONVERTED TO EXPLORATORY LAPAROTOMY, LYSIS OF ADHESIONS ;  Surgeon: Shelly Rubenstein, MD;  Location: [facility] OR;  Service: General;  Laterality: N/A;  . LUMBAR LAMINECTOMY/DECOMPRESSION MICRODISCECTOMY  10/29/1999   L5-S1  . SHOULDER SURGERY     left     Current Outpatient Medications  Medication Sig Dispense Refill  . aspirin EC 81 MG tablet Take 81 mg by mouth daily.    Marland Kitchen atenolol (TENORMIN) 25 MG tablet Take 1 tablet (25 mg total) by mouth daily. 30 tablet 11  . Cholecalciferol (VITAMIN D) 2000 UNITS CAPS Take  4,000 Units by mouth daily.    Marland Kitchen lisinopril (ZESTRIL) 10 MG tablet TAKE ONE (1) TABLET EACH DAY 30 tablet 3  . pantoprazole (PROTONIX) 40 MG tablet Take 1 tablet (40 mg total) by mouth daily. (Patient taking differently: Take 20 mg by mouth daily. ) 30 tablet 1  . sildenafil (VIAGRA) 100 MG tablet Take 20 mg by mouth daily as needed for erectile dysfunction.      No current facility-administered medications for this visit.    Allergies:   Adhesive [tape]    Social History:  The patient  reports that he has never smoked. He has never used smokeless tobacco. He reports that he does not drink alcohol or  use drugs.   Family History:  The patient's family history includes Anuerysm in his sister; Arthritis in his sister and sister; COPD in his mother; Heart attack in his father; Heart disease in his mother; Hypertension in his brother and sister; Rectal cancer in his mother; Stroke in his sister.    ROS:  Please see the history of present illness.   Otherwise, review of systems are positive for none.   All other systems are reviewed and negative.    PHYSICAL EXAM: VS:  BP 110/70   Pulse 63   Temp (!) 97.1 F (36.2 C)   Ht 5\' 8"  (1.727 m)   Wt 226 lb (102.5 kg)   SpO2 98%   BMI 34.36 kg/m  , BMI Body mass index is 34.36 kg/m. GEN: Well nourished, well developed, in no acute distress  HEENT: normal  Neck: no JVD, carotid bruits, or masses Cardiac: RRR; no murmurs, rubs, or gallops,no edema  Respiratory:  clear to auscultation bilaterally, normal work of breathing GI: soft, nontender, nondistended, + BS MS: no deformity or atrophy  Skin: warm and dry, no rash Neuro:  Strength and sensation are intact Psych: euthymic mood, full affect   EKG:  EKG is ordered today. The ekg ordered today demonstrates normal sinus rhythm with no significant ST or T wave changes.     Recent Labs: 06/15/2019: ALT 39; BUN 18; Creatinine, Ser 0.78; Hemoglobin 15.4; Platelets 350; Potassium 4.1; Sodium 137    Lipid Panel    Component Value Date/Time   CHOL 155 09/05/2012 0925   TRIG 68 09/05/2012 0925   HDL 43 09/05/2012 0925   LDLCALC 98 09/05/2012 0925      Wt Readings from Last 3 Encounters:  07/10/19 226 lb (102.5 kg)  06/15/19 218 lb (98.9 kg)  01/09/19 226 lb 6.4 oz (102.7 kg)       No flowsheet data found.    ASSESSMENT AND PLAN:  1. Ascending aortic aneurysm: Most recent CT in April 2020 showed a measurement of 4.3 cm.  No evidence of Marfan's, connective tissue disease or bicuspid aortic valve.  Thus, the threshold for surgical intervention is 5.5 cm.  No murmurs by physical  exam.  I requested repeat CT imaging in April 2021.  Avoid heavy lifting. 2. Essential hypertension: He continues to complain of orthostatic dizziness likely due to atenolol.  Thus, I discontinued the medication.  If his blood pressure increases, we can always consider increasing the dose of lisinopril. 3. Hyperlipidemia: Most recent lipid profile in December showed an LDL of 80 and triglyceride of 94.  This was not on any treatment.  Given coronary atherosclerosis and mildly elevated calcium score on CTA, I elected to start small dose rosuvastatin 5 mg daily.  Check repeat labs in 6 weeks.  Disposition:   FU with me in 6 months  Signed,  Kathlyn Sacramento, MD  07/10/2019 8:39 AM    [place]

## 2019-07-12 DIAGNOSIS — Z471 Aftercare following joint replacement surgery: Secondary | ICD-10-CM | POA: Diagnosis not present

## 2019-07-12 DIAGNOSIS — Z96651 Presence of right artificial knee joint: Secondary | ICD-10-CM | POA: Diagnosis not present

## 2019-08-23 DIAGNOSIS — I2510 Atherosclerotic heart disease of native coronary artery without angina pectoris: Secondary | ICD-10-CM | POA: Diagnosis not present

## 2019-08-24 LAB — LIPID PANEL
Chol/HDL Ratio: 3.6 ratio (ref 0.0–5.0)
Cholesterol, Total: 141 mg/dL (ref 100–199)
HDL: 39 mg/dL — ABNORMAL LOW (ref >39)
LDL Chol Calc (NIH): 89 mg/dL (ref 0–99)
Triglycerides: 63 mg/dL (ref 0–149)
VLDL Cholesterol Cal: 13 mg/dL (ref 5–40)

## 2019-08-24 LAB — COMPREHENSIVE METABOLIC PANEL
ALT: 35 IU/L (ref 0–44)
AST: 23 IU/L (ref 0–40)
Albumin/Globulin Ratio: 1.5 (ref 1.2–2.2)
Albumin: 4.6 g/dL (ref 3.8–4.9)
Alkaline Phosphatase: 55 IU/L (ref 39–117)
BUN/Creatinine Ratio: 20 (ref 9–20)
BUN: 17 mg/dL (ref 6–24)
Bilirubin Total: 0.4 mg/dL (ref 0.0–1.2)
CO2: 25 mmol/L (ref 20–29)
Calcium: 9.3 mg/dL (ref 8.7–10.2)
Chloride: 103 mmol/L (ref 96–106)
Creatinine, Ser: 0.87 mg/dL (ref 0.76–1.27)
GFR calc Af Amer: 110 mL/min/{1.73_m2} (ref >59)
GFR calc non Af Amer: 95 mL/min/{1.73_m2} (ref >59)
Globulin, Total: 3.1 g/dL (ref 1.5–4.5)
Glucose: 82 mg/dL (ref 65–99)
Potassium: 5 mmol/L (ref 3.5–5.2)
Sodium: 141 mmol/L (ref 134–144)
Total Protein: 7.7 g/dL (ref 6.0–8.5)

## 2019-08-28 ENCOUNTER — Telehealth: Payer: Self-pay

## 2019-08-28 DIAGNOSIS — E785 Hyperlipidemia, unspecified: Secondary | ICD-10-CM

## 2019-08-28 NOTE — Telephone Encounter (Signed)
Given intolerance to low-dose rosuvastatin, I recommend that we switch to pravastatin 10 mg daily with repeat lipid panel and ALT in about 3 months.  If he does not tolerate this medication either, he will need to speak with Dr. Kirke Corin about other non-statin treatments like ezetimibe, PCSK9 inhibitors, or ezetimibe in the future.  Yvonne Kendall, MD [facility]

## 2019-08-28 NOTE — Telephone Encounter (Signed)
Patient made aware of lab results and Dr. Jari Sportsman recommendation.  Patient sts that he is not tolerating Rosuvastatin and had to stop the medication last week due to severe joint and muscle pain.  Advised the patient that I will fwd the update to Dr. Kirke Corin and call back with his recommendation. Patient verbalized understanding.

## 2019-08-28 NOTE — Telephone Encounter (Signed)
-----   Message from Iran Ouch, MD sent at 08/24/2019  1:46 PM EDT ----- Inform patient that labs were normal. Cholesterol was good but not at target of less than 70.  If he is tolerating rosuvastatin, I recommend increasing the dose to 10 mg.

## 2019-08-29 ENCOUNTER — Other Ambulatory Visit: Payer: Self-pay | Admitting: Cardiology

## 2019-08-29 DIAGNOSIS — I10 Essential (primary) hypertension: Secondary | ICD-10-CM

## 2019-08-30 DIAGNOSIS — Z23 Encounter for immunization: Secondary | ICD-10-CM | POA: Diagnosis not present

## 2019-08-30 MED ORDER — PRAVASTATIN SODIUM 20 MG PO TABS: 10.0000 mg | ORAL_TABLET | Freq: Every day | ORAL | 5 refills | Status: DC

## 2019-08-30 NOTE — Telephone Encounter (Signed)
Patient made aware of Dr. Serita Kyle recommendation. Patient is agreeable with the plan. Rx for Pravastatin 10 mg qd sent to the patients pharmacy. Patient will [facility] his fasting lipid and [facility]  Patient verbalized understanding to the instructions given and voiced appreciation for the call.

## 2019-09-03 ENCOUNTER — Other Ambulatory Visit: Payer: Self-pay

## 2019-09-03 ENCOUNTER — Ambulatory Visit (INDEPENDENT_AMBULATORY_CARE_PROVIDER_SITE_OTHER)
Admission: RE | Admit: 2019-09-03 | Discharge: 2019-09-03 | Disposition: A | Discharge status: Home / Self Care | Payer: BC Managed Care – PPO | Source: Ambulatory Visit | Attending: Cardiovascular Disease | Admitting: Cardiovascular Disease

## 2019-09-03 DIAGNOSIS — I712 Thoracic aortic aneurysm, without rupture, unspecified: Secondary | ICD-10-CM

## 2019-09-03 DIAGNOSIS — I7121 Aneurysm of the ascending aorta, without rupture: Secondary | ICD-10-CM

## 2019-09-03 MED ORDER — IOHEXOL 350 MG/ML SOLN
80.0000 mL | Freq: Once | INTRAVENOUS | Status: AC | PRN
  Administered 2019-09-03: 09:00:00 80 mL via INTRAVENOUS

## 2019-09-07 ENCOUNTER — Telehealth: Payer: Self-pay | Admitting: *Deleted

## 2019-09-07 DIAGNOSIS — I712 Thoracic aortic aneurysm, without rupture, unspecified: Secondary | ICD-10-CM

## 2019-09-07 DIAGNOSIS — Z01818 Encounter for other preprocedural examination: Secondary | ICD-10-CM

## 2019-09-07 NOTE — Telephone Encounter (Signed)
-----   Message from Iran Ouch, MD sent at 09/05/2019  4:14 PM EDT ----- Ascending aortic aneurysm is slightly more enlarged than before and now it measures 4.6 cm.  Recommend repeat CTA in 9 months.

## 2019-09-07 NOTE — Telephone Encounter (Signed)
Follow Up  Patient's wife is calling in with concerns about results. Please give patient's wife a call back.

## 2019-09-07 NOTE — Telephone Encounter (Signed)
Left a message for the patient to call back.  

## 2019-09-07 NOTE — Telephone Encounter (Addendum)
Patient made aware of results and verbalized understanding.  The patient has been concerned about an elevated blood pressure but was unable to give any specific readings. He stated that he felt like it was elevated due to occasional headaches at night.   He has been advised to keep a log of his blood pressures and to call back on Monday with those readings.

## 2019-09-11 NOTE — Telephone Encounter (Signed)
Patient's wife returning call. 

## 2019-09-11 NOTE — Telephone Encounter (Signed)
The patient's wife stated that they have made an appointment with Dr. Kirke Corin to discuss the results. Appointment on 4/27.   Current blood pressures at night were: 104/69 107/84 107/66 102/49

## 2019-09-25 ENCOUNTER — Encounter: Payer: Self-pay | Admitting: Cardiovascular Disease

## 2019-09-25 ENCOUNTER — Ambulatory Visit: Payer: BC Managed Care – PPO | Admitting: Cardiovascular Disease

## 2019-09-25 ENCOUNTER — Other Ambulatory Visit: Payer: Self-pay

## 2019-09-25 VITALS — BP 110/78 | HR 68 | Ht 68.0 in | Wt 229.0 lb

## 2019-09-25 DIAGNOSIS — I10 Essential (primary) hypertension: Secondary | ICD-10-CM | POA: Diagnosis not present

## 2019-09-25 DIAGNOSIS — E785 Hyperlipidemia, unspecified: Secondary | ICD-10-CM

## 2019-09-25 DIAGNOSIS — R0602 Shortness of breath: Secondary | ICD-10-CM | POA: Diagnosis not present

## 2019-09-25 DIAGNOSIS — I712 Thoracic aortic aneurysm, without rupture: Secondary | ICD-10-CM | POA: Diagnosis not present

## 2019-09-25 DIAGNOSIS — I7121 Aneurysm of the ascending aorta, without rupture: Secondary | ICD-10-CM

## 2019-09-25 NOTE — Patient Instructions (Signed)
Medication Instructions:  No changes *If you need a refill on your cardiac medications before your next appointment, please call your pharmacy*   Lab Work: None ordered If you have labs (blood work) drawn today and your tests are completely normal, you will receive your results only by: Marland Kitchen MyChart Message (if you have MyChart) OR . A paper copy in the mail If you have any lab test that is abnormal or we need to change your treatment, we will call you to review the results.   Testing/Procedures: Your physician has requested that you have an echocardiogram. Echocardiography is a painless test that uses sound waves to create images of your heart. It provides your doctor with information about the size and shape of your heart and how well your heart's chambers and valves are working. You may receive an ultrasound enhancing agent through an IV if needed to better visualize your heart during the echo.This procedure takes approximately one hour. There are no restrictions for this procedure. This will take place at the [address].     Follow-Up: At [facility], you and your health needs are our priority.  As part of our continuing mission to provide you with exceptional heart care, we have created designated Provider Care Teams.  These Care Teams include your primary Cardiologist (physician) and Advanced Practice Providers (APPs -  Physician Assistants and Nurse Practitioners) who all work together to provide you with the care you need, when you need it.  We recommend signing up for the patient portal called "MyChart".  Sign up information is provided on this After Visit Summary.  MyChart is used to connect with patients for Virtual Visits (Telemedicine).  Patients are able to view lab/test results, encounter notes, upcoming appointments, etc.  Non-urgent messages can be sent to your provider as well.   To learn more about what you can do with MyChart, go to [email or web address].     Your next appointment:   6 month(s)  The format for your next appointment:   In Person  Provider:   Lorine Bears, MD   Other Instructions Do not take Flouroquinolones Antibiotics:  fluoroquinolones include levofloxacin (Levaquin), ciprofloxacin (Cipro), ciprofloxacin extended-release tablets, moxifloxacin (Avelox), ofloxacin, gemifloxacin (Factive) and delafloxacin (Baxdela).  Do not lift anything over 20 pounds

## 2019-09-25 NOTE — Progress Notes (Signed)
Cardiology Office Note   Date:  09/25/2019   ID:  Jonathan Burke, DOB 10/28/1960, MRN [id number]  PCP:  Ileana Ladd, MD  Cardiologist:   Lorine Bears, MD   No chief complaint on file.     History of Present Illness: Jonathan Burke is a 59 y.o. male who presents for a follow-up visit regarding ascending aortic aneurysm. He has known history of essential hypertension.  There is family history of sudden death involving his father and brother. He had cardiac evaluation in February,2020 with CTA of the coronary arteries.  Coronary calcium score was 74.  There was mild nonobstructive LAD disease that was not significant by FFR.  There was incidental finding of ascending aortic aneurysm measured 4.5 cm.  He had a repeat CT of the chest in April and the aneurysm was 4.3 cm.  The notes indicate that he underwent genetic testing for aortopathy which was negative.  Echocardiogram showed an EF was 50 to 55%.  Mild aortic valve calcifications were noted but there was no mention of bicuspid aortic valve. The patient was placed on amlodipine but he had edema and then switched to atenolol and lisinopril.   He is not a smoker.  He had repeat CTA earlier this month which showed that the aneurysm was 4.6 cm.  He has been doing reasonably well with no chest pain.  However, he reports worsening exertional dyspnea.  He did not tolerate rosuvastatin or atorvastatin due to severe myalgia.  He is currently on pravastatin.  Atenolol was discontinued during last visit due to low blood pressure and orthostatic dizziness.    Past Medical History:  Diagnosis Date  . Blood in urine   . 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 Chest pain   . Complication of anesthesia    states is hard to wake up  . Dental crowns present    also caps  . Dilated aortic root (HCC)   . Enlarged prostate   . GERD (gastroesophageal reflux disease)    daily OTC  . H/O hiatal hernia    pt unsure if hernia or not, but  has a knot in his stomach  . Headache(784.0)    tension  . PONV (postoperative nausea and vomiting)   . Small bowel obstruction (HCC) 07/17/2012    Past Surgical History:  Procedure Laterality Date  . APPENDECTOMY    . CARPAL TUNNEL RELEASE  09/09/2011   Procedure: CARPAL TUNNEL RELEASE;  Surgeon: Wyn Forster., MD;  Location: [facility];  Service: Orthopedics;  Laterality: Left;  . CHOLECYSTECTOMY    . 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 ESOPHAGOGASTRODUODENOSCOPY N/A 06/05/2013   Procedure: ESOPHAGOGASTRODUODENOSCOPY (EGD);  Surgeon: Vertell Novak., MD;  Location[facility];  Service: Endoscopy;  Laterality: N/A;  . LAPAROSCOPIC LYSIS OF ADHESIONS N/A 05/29/2013   Procedure: DIAGNOSTIC LAPAROSCOPY CONVERTED TO EXPLORATORY LAPAROTOMY, LYSIS OF ADHESIONS ;  Surgeon: Shelly Rubenstein, MD;  Location: [facility] OR;  Service: General;  Laterality: N/A;  . LUMBAR LAMINECTOMY/DECOMPRESSION MICRODISCECTOMY  10/29/1999   L5-S1  . SHOULDER SURGERY     left     Current Outpatient Medications  Medication Sig Dispense Refill  . aspirin EC 81 MG tablet Take 81 mg by mouth daily.    . Cholecalciferol (VITAMIN D) 2000 UNITS CAPS Take 4,000 Units by mouth daily.    Marland Kitchen lisinopril (ZESTRIL) 10 MG tablet  TAKE ONE (1) TABLET EACH DAY 30 tablet 3  . pantoprazole (PROTONIX) 40 MG tablet Take 1 tablet (40 mg total) by mouth daily. (Patient taking differently: Take 20 mg by mouth daily. ) 30 tablet 1  . pravastatin (PRAVACHOL) 20 MG tablet Take 0.5 tablets (10 mg total) by mouth daily. 15 tablet 5  . sildenafil (VIAGRA) 100 MG tablet Take 20 mg by mouth daily as needed for erectile dysfunction.      No current facility-administered medications for this visit.    Allergies:   Adhesive [tape]    Social History:  The patient  reports that he has never smoked. He has never used smokeless tobacco. He reports that he does  not drink alcohol or use drugs.   Family History:  The patient's family history includes Anuerysm in his sister; Arthritis in his sister and sister; COPD in his mother; Heart attack in his father; Heart disease in his mother; Hypertension in his brother and sister; Rectal cancer in his mother; Stroke in his sister.    ROS:  Please see the history of present illness.   Otherwise, review of systems are positive for none.   All other systems are reviewed and negative.    PHYSICAL EXAM: VS:  BP 110/78   Pulse 68   Ht 5\' 8"  (1.727 m)   Wt 229 lb (103.9 kg)   SpO2 98%   BMI 34.82 kg/m  , BMI Body mass index is 34.82 kg/m. GEN: Well nourished, well developed, in no acute distress  HEENT: normal  Neck: no JVD, carotid bruits, or masses Cardiac: RRR; no murmurs, rubs, or gallops,no edema  Respiratory:  clear to auscultation bilaterally, normal work of breathing GI: soft, nontender, nondistended, + BS MS: no deformity or atrophy  Skin: warm and dry, no rash Neuro:  Strength and sensation are intact Psych: euthymic mood, full affect   EKG:  EKG is not ordered today.   Recent Labs: 06/15/2019: Hemoglobin 15.4; Platelets 350 08/23/2019: ALT 35; BUN 17; Creatinine, Ser 0.87; Potassium 5.0; Sodium 141    Lipid Panel    Component Value Date/Time   CHOL 141 08/23/2019 1158   CHOL 155 09/05/2012 0925   TRIG 63 08/23/2019 1158   TRIG 68 09/05/2012 0925   HDL 39 (L) 08/23/2019 1158   HDL 43 09/05/2012 0925   CHOLHDL 3.6 08/23/2019 1158   LDLCALC 89 08/23/2019 1158   LDLCALC 98 09/05/2012 0925      Wt Readings from Last 3 Encounters:  09/25/19 229 lb (103.9 kg)  07/10/19 226 lb (102.5 kg)  06/15/19 218 lb (98.9 kg)       No flowsheet data found.    ASSESSMENT AND PLAN:  1. Ascending aortic aneurysm: Most recent CT showed slight progression to 4.6 cm.  There might be some variability in measurement as his initial measurement last year was 4.5 cm.  His wife was very concerned  about the growth but I explained to her that this is still considered not a significant growth.    No evidence of Marfan's, connective tissue disease or bicuspid aortic valve.  Thus, the threshold for surgical intervention is 5.5 cm.  No murmurs by physical exam.  Avoid heavy lifting of more than 20 pounds and avoid fluoroquinolones antibiotics.  Repeat CT study later this year. 2. Essential hypertension: Blood pressure is controlled on lisinopril 3. Hyperlipidemia: Intolerance to atorvastatin and rosuvastatin but currently tolerating pravastatin. 4. Shortness of breath: Some worsening since last visit.  I requested  an echocardiogram.    Disposition:   FU with me in 6 months  Signed,  Kathlyn Sacramento, MD  09/25/2019 8:49 AM    [place]

## 2019-09-27 DIAGNOSIS — Z23 Encounter for immunization: Secondary | ICD-10-CM | POA: Diagnosis not present

## 2019-10-02 DIAGNOSIS — I77810 Thoracic aortic ectasia: Secondary | ICD-10-CM | POA: Diagnosis not present

## 2019-10-02 DIAGNOSIS — E669 Obesity, unspecified: Secondary | ICD-10-CM | POA: Diagnosis not present

## 2019-10-02 DIAGNOSIS — N529 Male erectile dysfunction, unspecified: Secondary | ICD-10-CM | POA: Diagnosis not present

## 2019-10-02 DIAGNOSIS — Z125 Encounter for screening for malignant neoplasm of prostate: Secondary | ICD-10-CM | POA: Diagnosis not present

## 2019-10-02 DIAGNOSIS — I10 Essential (primary) hypertension: Secondary | ICD-10-CM | POA: Diagnosis not present

## 2019-10-02 DIAGNOSIS — K219 Gastro-esophageal reflux disease without esophagitis: Secondary | ICD-10-CM | POA: Diagnosis not present

## 2019-10-12 ENCOUNTER — Ambulatory Visit (HOSPITAL_COMMUNITY): Payer: BC Managed Care – PPO | Attending: Cardiovascular Disease

## 2019-10-12 ENCOUNTER — Other Ambulatory Visit: Payer: Self-pay

## 2019-10-12 DIAGNOSIS — R0602 Shortness of breath: Secondary | ICD-10-CM

## 2019-10-18 ENCOUNTER — Telehealth: Payer: Self-pay | Admitting: *Deleted

## 2019-10-18 DIAGNOSIS — Z01818 Encounter for other preprocedural examination: Secondary | ICD-10-CM

## 2019-10-18 DIAGNOSIS — I7121 Aneurysm of the ascending aorta, without rupture: Secondary | ICD-10-CM

## 2019-10-18 NOTE — Telephone Encounter (Signed)
Patient made aware of results and verbalized understanding.  The patient would like to have the TEE done at [facility] with Dr. Kirke Corin. The patient has been advised that we will call back with specific instructions but he is aware of needing lab work and a covid test completed.

## 2019-10-18 NOTE — Telephone Encounter (Signed)
-----   Message from [place] Ouch, MD sent at 10/18/2019  7:52 AM EDT ----- Inform patient that echo showed normal ejection fraction.  His aortic valve might be bicuspid and if we confirm that, then surgery on the aortic aneurysm would be indicated once it reaches 50 mm and not 55 mm.  I do think we have to confirm this and thus I recommend scheduling a transesophageal echocardiogram to evaluate aortic valve.   I can do at [facility] on a Monday or it can be done at [facility] with one of our other cardiologists.  I am happy to call them if they have questions about this.

## 2019-10-22 ENCOUNTER — Encounter: Payer: Self-pay | Admitting: *Deleted

## 2019-10-22 NOTE — Telephone Encounter (Signed)
Called the patient to schedule the TEE. Instructions will be mailed.  You are scheduled for a TEE on 11/05/19 with Dr. Kirke Corin.  Arrive at the Medical Mall entrance at 7:30 at [facility], one hour prior to your procedure. Free valet service is available.  After entering the Medical Mall please check-in at the registration desk (1st desk on your right) to receive your armband. After receiving your armband someone will escort you to the cardiac cath/special procedures waiting area.  DIET: Nothing to eat or drink after midnight except a sip of water with medications (see medication instructions below)  Medication Instructions: Nothing to hold.  Labs:   Your provider would like for you to return on 11/01/19 to have the following labs drawn: CBC and BMET. You do not need an appointment for the lab. Once in our office lobby there is a podium where you can sign in and ring the doorbell to alert [place] that you are here. The lab is open from 8:00 am to 4:30 pm; closed for lunch from 12:45pm-1:45pm.  You will need to have the coronavirus test completed prior to your procedure. An appointment has been made at 8:55 on 11/01/19. This is a Drive Up Visit at the [facility] [address] Someone will direct you to the appropriate testing line. Please tell them that you are there for procedure testing. Stay in your car and someone will be with you shortly. Please make sure to have all other labs completed before this test because you will need to stay quarantined until your procedure.  You must have a responsible person to drive you home and stay in the waiting area during your procedure. Failure to do so could result in cancellation.  Bring your insurance cards.  *Special Note: Every effort is made to have your procedure done on time. Occasionally there are emergencies that occur at the hospital that may cause delays. Please be patient if a delay does occur.

## 2019-10-24 DIAGNOSIS — H35033 Hypertensive retinopathy, bilateral: Secondary | ICD-10-CM | POA: Diagnosis not present

## 2019-10-24 DIAGNOSIS — H25813 Combined forms of age-related cataract, bilateral: Secondary | ICD-10-CM | POA: Diagnosis not present

## 2019-10-31 ENCOUNTER — Other Ambulatory Visit (HOSPITAL_COMMUNITY)
Admission: RE | Admit: 2019-10-31 | Discharge: 2019-10-31 | Disposition: A | Discharge status: Home / Self Care | Payer: BLUE CROSS/BLUE SHIELD | Source: Ambulatory Visit | Attending: Cardiovascular Disease | Admitting: Cardiovascular Disease

## 2019-10-31 DIAGNOSIS — I712 Thoracic aortic aneurysm, without rupture: Secondary | ICD-10-CM | POA: Diagnosis not present

## 2019-10-31 DIAGNOSIS — Z20822 Contact with and (suspected) exposure to covid-19: Secondary | ICD-10-CM | POA: Insufficient documentation

## 2019-10-31 DIAGNOSIS — Z01818 Encounter for other preprocedural examination: Secondary | ICD-10-CM | POA: Diagnosis not present

## 2019-10-31 DIAGNOSIS — Z01812 Encounter for preprocedural laboratory examination: Secondary | ICD-10-CM | POA: Diagnosis not present

## 2019-10-31 LAB — BASIC METABOLIC PANEL
BUN/Creatinine Ratio: 19 (ref 9–20)
BUN: 15 mg/dL (ref 6–24)
CO2: 22 mmol/L (ref 20–29)
Calcium: 9.5 mg/dL (ref 8.7–10.2)
Chloride: 101 mmol/L (ref 96–106)
Creatinine, Ser: 0.8 mg/dL (ref 0.76–1.27)
GFR calc Af Amer: 114 mL/min/{1.73_m2} (ref >59)
GFR calc non Af Amer: 98 mL/min/{1.73_m2} (ref >59)
Glucose: 99 mg/dL (ref 65–99)
Potassium: 4.5 mmol/L (ref 3.5–5.2)
Sodium: 139 mmol/L (ref 134–144)

## 2019-10-31 LAB — CBC
Hematocrit: 42.9 % (ref 37.5–51.0)
Hemoglobin: 15.1 g/dL (ref 13.0–17.7)
MCH: 31.9 pg (ref 26.6–33.0)
MCHC: 35.2 g/dL (ref 31.5–35.7)
MCV: 91 fL (ref 79–97)
Platelets: 291 10*3/uL (ref 150–450)
RBC: 4.73 x10E6/uL (ref 4.14–5.80)
RDW: 11.9 % (ref 11.6–15.4)
WBC: 6.9 10*3/uL (ref 3.4–10.8)

## 2019-10-31 LAB — SARS CORONAVIRUS 2 (TAT 6-24 HRS): SARS Coronavirus 2: NEGATIVE

## 2019-11-01 ENCOUNTER — Other Ambulatory Visit (HOSPITAL_COMMUNITY): Payer: BC Managed Care – PPO

## 2019-11-05 ENCOUNTER — Ambulatory Visit (HOSPITAL_BASED_OUTPATIENT_CLINIC_OR_DEPARTMENT_OTHER)
Admission: RE | Admit: 2019-11-05 | Discharge: 2019-11-05 | Disposition: A | Discharge status: Home / Self Care | Payer: BLUE CROSS/BLUE SHIELD | Source: Home / Self Care | Attending: Cardiovascular Disease | Admitting: Cardiovascular Disease

## 2019-11-05 ENCOUNTER — Encounter
Admission: RE | Disposition: A | Discharge status: Home / Self Care | Payer: Self-pay | Source: Home / Self Care | Attending: Cardiovascular Disease

## 2019-11-05 ENCOUNTER — Ambulatory Visit
Admission: RE | Admit: 2019-11-05 | Discharge: 2019-11-05 | Disposition: A | Discharge status: Home / Self Care | Payer: BLUE CROSS/BLUE SHIELD | Attending: Cardiovascular Disease | Admitting: Cardiovascular Disease

## 2019-11-05 ENCOUNTER — Encounter: Payer: Self-pay | Admitting: Cardiovascular Disease

## 2019-11-05 ENCOUNTER — Other Ambulatory Visit: Payer: Self-pay

## 2019-11-05 DIAGNOSIS — I77810 Thoracic aortic ectasia: Secondary | ICD-10-CM

## 2019-11-05 DIAGNOSIS — Z825 Family history of asthma and other chronic lower respiratory diseases: Secondary | ICD-10-CM | POA: Insufficient documentation

## 2019-11-05 DIAGNOSIS — K219 Gastro-esophageal reflux disease without esophagitis: Secondary | ICD-10-CM | POA: Diagnosis not present

## 2019-11-05 DIAGNOSIS — Z79899 Other long term (current) drug therapy: Secondary | ICD-10-CM | POA: Insufficient documentation

## 2019-11-05 DIAGNOSIS — Z823 Family history of stroke: Secondary | ICD-10-CM | POA: Insufficient documentation

## 2019-11-05 DIAGNOSIS — Z8261 Family history of arthritis: Secondary | ICD-10-CM | POA: Diagnosis not present

## 2019-11-05 DIAGNOSIS — Q231 Congenital insufficiency of aortic valve: Secondary | ICD-10-CM

## 2019-11-05 DIAGNOSIS — E785 Hyperlipidemia, unspecified: Secondary | ICD-10-CM | POA: Insufficient documentation

## 2019-11-05 DIAGNOSIS — Z9049 Acquired absence of other specified parts of digestive tract: Secondary | ICD-10-CM | POA: Insufficient documentation

## 2019-11-05 DIAGNOSIS — Z888 Allergy status to other drugs, medicaments and biological substances status: Secondary | ICD-10-CM | POA: Diagnosis not present

## 2019-11-05 DIAGNOSIS — N400 Enlarged prostate without lower urinary tract symptoms: Secondary | ICD-10-CM | POA: Insufficient documentation

## 2019-11-05 DIAGNOSIS — Z800 Family history of malignant neoplasm of digestive organs: Secondary | ICD-10-CM | POA: Diagnosis not present

## 2019-11-05 DIAGNOSIS — Z8249 Family history of ischemic heart disease and other diseases of the circulatory system: Secondary | ICD-10-CM | POA: Diagnosis not present

## 2019-11-05 DIAGNOSIS — R079 Chest pain, unspecified: Secondary | ICD-10-CM | POA: Diagnosis not present

## 2019-11-05 DIAGNOSIS — I2510 Atherosclerotic heart disease of native coronary artery without angina pectoris: Secondary | ICD-10-CM | POA: Diagnosis not present

## 2019-11-05 DIAGNOSIS — I712 Thoracic aortic aneurysm, without rupture: Secondary | ICD-10-CM | POA: Diagnosis not present

## 2019-11-05 DIAGNOSIS — I358 Other nonrheumatic aortic valve disorders: Secondary | ICD-10-CM | POA: Diagnosis not present

## 2019-11-05 DIAGNOSIS — Z7982 Long term (current) use of aspirin: Secondary | ICD-10-CM | POA: Insufficient documentation

## 2019-11-05 DIAGNOSIS — I10 Essential (primary) hypertension: Secondary | ICD-10-CM | POA: Diagnosis not present

## 2019-11-05 DIAGNOSIS — Q2381 Bicuspid aortic valve: Secondary | ICD-10-CM

## 2019-11-05 DIAGNOSIS — Z91048 Other nonmedicinal substance allergy status: Secondary | ICD-10-CM | POA: Insufficient documentation

## 2019-11-05 DIAGNOSIS — I359 Nonrheumatic aortic valve disorder, unspecified: Secondary | ICD-10-CM | POA: Diagnosis present

## 2019-11-05 HISTORY — DX: Sleep apnea, unspecified: G47.30

## 2019-11-05 HISTORY — PX: TEE WITHOUT CARDIOVERSION: SHX5443

## 2019-11-05 SURGERY — ECHOCARDIOGRAM, TRANSESOPHAGEAL
Anesthesia: Moderate Sedation

## 2019-11-05 MED ORDER — MIDAZOLAM HCL 5 MG/5ML IJ SOLN
INTRAMUSCULAR | Status: AC
  Filled 2019-11-05: qty 5

## 2019-11-05 MED ORDER — BUTAMBEN-TETRACAINE-BENZOCAINE 2-2-14 % EX AERO
INHALATION_SPRAY | CUTANEOUS | Status: AC
  Filled 2019-11-05: qty 5

## 2019-11-05 MED ORDER — SODIUM CHLORIDE FLUSH 0.9 % IV SOLN
INTRAVENOUS | Status: AC
  Filled 2019-11-05: qty 10

## 2019-11-05 MED ORDER — LOSARTAN POTASSIUM 25 MG PO TABS
25.0000 mg | ORAL_TABLET | Freq: Every day | ORAL | 6 refills | Status: DC
Start: 2019-11-05 — End: 2020-04-30

## 2019-11-05 MED ORDER — MIDAZOLAM HCL 2 MG/2ML IJ SOLN
INTRAMUSCULAR | Status: AC | PRN
  Administered 2019-11-05: 2 mg via INTRAVENOUS

## 2019-11-05 MED ORDER — LIDOCAINE VISCOUS HCL 2 % MT SOLN
OROMUCOSAL | Status: AC
  Filled 2019-11-05: qty 15

## 2019-11-05 MED ORDER — FENTANYL CITRATE (PF) 100 MCG/2ML IJ SOLN
INTRAMUSCULAR | Status: AC | PRN
  Administered 2019-11-05: 50 ug via INTRAVENOUS

## 2019-11-05 MED ORDER — SODIUM CHLORIDE 0.9 % IV SOLN: INTRAVENOUS | Status: DC

## 2019-11-05 MED ORDER — FENTANYL CITRATE (PF) 100 MCG/2ML IJ SOLN
INTRAMUSCULAR | Status: AC
  Filled 2019-11-05: qty 2

## 2019-11-05 NOTE — Progress Notes (Signed)
Dr. Darrold Junker at bedside, speaking with pt. And his spouse re: PCI results. Both verbalized understanding of conversation.

## 2019-11-05 NOTE — CV Procedure (Signed)
A transesophageal echocardiogram was performed without complications.  Please see procedure note.  During this procedure the patient is administered a total of Versed 2 mg and Fentanyl 50 mcg to achieve and maintain moderate conscious sedation.  The patient's heart rate, blood pressure, and oxygen saturation are monitored continuously during the procedure. The period of conscious sedation is 13 minutes, of which I was present face-to-face 100% of this time.

## 2019-11-05 NOTE — H&P (Signed)
Cardiology Office Note   Date:  09/25/2019   ID:  Jonathan Burke, DOB 1960/08/16, MRN [id number]  PCP:  Vernie Shanks, MD    Cardiologist:   Kathlyn Sacramento, MD   No chief complaint on file.     History of Present Illness: Jonathan Burke is a 58 y.o. male who presents for a follow-up visit regarding ascending aortic aneurysm. He has known history of essential hypertension.  There is family history of sudden death involving his father and brother. He had cardiac evaluation in February,2020 with CTA of the coronary arteries.  Coronary calcium score was 74.  There was mild nonobstructive LAD disease that was not significant by FFR.  There was incidental finding of ascending aortic aneurysm measured 4.5 cm.  He had a repeat CT of the chest in April and the aneurysm was 4.3 cm.  The notes indicate that he underwent genetic testing for aortopathy which was negative.  Echocardiogram showed an EF was 50 to 55%.  Mild aortic valve calcifications were noted but there was no mention of bicuspid aortic valve. The patient was placed on amlodipine but he had edema and then switched to atenolol and lisinopril.   He is not a smoker.  He had repeat CTA earlier this month which showed that the aneurysm was 4.6 cm.  He has been doing reasonably well with no chest pain.  However, he reports worsening exertional dyspnea.  He did not tolerate rosuvastatin or atorvastatin due to severe myalgia.  He is currently on pravastatin.  Atenolol was discontinued during last visit due to low blood pressure and orthostatic dizziness.        Past Medical History:  Diagnosis Date  . Blood in urine   . 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 Chest pain   . Complication of anesthesia    states is hard to wake up  . Dental crowns present    also caps  . Dilated aortic root ([place])   . Enlarged prostate   . GERD (gastroesophageal reflux disease)    daily OTC  . H/O hiatal hernia    pt unsure  if hernia or not, but has a knot in his stomach  . Headache(784.0)    tension  . PONV (postoperative nausea and vomiting)   . Small bowel obstruction ([place]) 07/17/2012         Past Surgical History:  Procedure Laterality Date  . APPENDECTOMY    . CARPAL TUNNEL RELEASE  09/09/2011   Procedure: CARPAL TUNNEL RELEASE;  Surgeon: Cammie Sickle., MD;  Location: [place];  Service: Orthopedics;  Laterality: Left;  . CHOLECYSTECTOMY    . 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 ESOPHAGOGASTRODUODENOSCOPY N/A 06/05/2013   Procedure: ESOPHAGOGASTRODUODENOSCOPY (EGD);  Surgeon: Winfield Cunas., MD;  Location[facility] ENDOSCOPY;  Service: Endoscopy;  Laterality: N/A;  . LAPAROSCOPIC LYSIS OF ADHESIONS N/A 05/29/2013   Procedure: DIAGNOSTIC LAPAROSCOPY CONVERTED TO EXPLORATORY LAPAROTOMY, LYSIS OF ADHESIONS ;  Surgeon: Harl Bowie, MD;  Location: [place];  Service: General;  Laterality: N/A;  . LUMBAR LAMINECTOMY/DECOMPRESSION MICRODISCECTOMY  10/29/1999   L5-S1  . SHOULDER SURGERY     left           Current Outpatient Medications  Medication Sig Dispense Refill  . aspirin EC 81 MG tablet Take 81 mg by mouth daily.    . Cholecalciferol (VITAMIN D) 2000 UNITS CAPS  Take 4,000 Units by mouth daily.    Marland Kitchen lisinopril (ZESTRIL) 10 MG tablet TAKE ONE (1) TABLET EACH DAY 30 tablet 3  . pantoprazole (PROTONIX) 40 MG tablet Take 1 tablet (40 mg total) by mouth daily. (Patient taking differently: Take 20 mg by mouth daily. ) 30 tablet 1  . pravastatin (PRAVACHOL) 20 MG tablet Take 0.5 tablets (10 mg total) by mouth daily. 15 tablet 5  . sildenafil (VIAGRA) 100 MG tablet Take 20 mg by mouth daily as needed for erectile dysfunction.      No current facility-administered medications for this visit.    Allergies:   Adhesive [tape]    Social History:  The patient  reports that he has never  smoked. He has never used smokeless tobacco. He reports that he does not drink alcohol or use drugs.   Family History:  The patient's family history includes Anuerysm in his sister; Arthritis in his sister and sister; COPD in his mother; Heart attack in his father; Heart disease in his mother; Hypertension in his brother and sister; Rectal cancer in his mother; Stroke in his sister.    ROS:  Please see the history of present illness.   Otherwise, review of systems are positive for none.   All other systems are reviewed and negative.    PHYSICAL EXAM: VS:  BP 110/78   Pulse 68   Ht 5\' 8"  (1.727 m)   Wt 229 lb (103.9 kg)   SpO2 98%   BMI 34.82 kg/m  , BMI Body mass index is 34.82 kg/m. GEN: Well nourished, well developed, in no acute distress  HEENT: normal  Neck: no JVD, carotid bruits, or masses Cardiac: RRR; no murmurs, rubs, or gallops,no edema  Respiratory:  clear to auscultation bilaterally, normal work of breathing GI: soft, nontender, nondistended, + BS MS: no deformity or atrophy  Skin: warm and dry, no rash Neuro:  Strength and sensation are intact Psych: euthymic mood, full affect   EKG:  EKG is not ordered today.   Recent Labs: 06/15/2019: Hemoglobin 15.4; Platelets 350 08/23/2019: ALT 35; BUN 17; Creatinine, Ser 0.87; Potassium 5.0; Sodium 141    Lipid Panel Labs (Brief)          Component Value Date/Time   CHOL 141 08/23/2019 1158   CHOL 155 09/05/2012 0925   TRIG 63 08/23/2019 1158   TRIG 68 09/05/2012 0925   HDL 39 (L) 08/23/2019 1158   HDL 43 09/05/2012 0925   CHOLHDL 3.6 08/23/2019 1158   LDLCALC 89 08/23/2019 1158   LDLCALC 98 09/05/2012 0925           Wt Readings from Last 3 Encounters:  09/25/19 229 lb (103.9 kg)  07/10/19 226 lb (102.5 kg)  06/15/19 218 lb (98.9 kg)       No flowsheet data found.    ASSESSMENT AND PLAN:  1. Ascending aortic aneurysm: Most recent CT showed slight progression to 4.6 cm.   There might be some variability in measurement as his initial measurement last year was 4.5 cm.  His wife was very concerned about the growth but I explained to her that this is still considered not a significant growth.    No evidence of Marfan's, connective tissue disease or bicuspid aortic valve.  Thus, the threshold for surgical intervention is 5.5 cm.  No murmurs by physical exam.  Avoid heavy lifting of more than 20 pounds and avoid fluoroquinolones antibiotics.  Repeat CT study later this year. 2. Essential hypertension: Blood  pressure is controlled on lisinopril 3. Hyperlipidemia: Intolerance to atorvastatin and rosuvastatin but currently tolerating pravastatin. 4. Shortness of breath: Some worsening since last visit.  I requested an echocardiogram.    Disposition:   FU with me in 6 months  Signed,  Lorine Bears, MD  09/25/2019 8:49 AM    [facility]  Addendum on June 7 of 2021: Echocardiogram was performed as an outpatient which suggested probable bicuspid aortic valve with normal ejection fraction.  Given history of ascending aortic aneurysm, we need to determine with certainty if his aortic valve is bicuspid or not as it will change the surgical threshold for treating his ascending aortic aneurysm.  The patient is here for a transesophageal echocardiogram.  I discussed the procedure in details as well as risk and benefits.  By physical exam, heart is regular with no murmurs.  Lungs are clear to auscultation with no significant edema.

## 2019-11-05 NOTE — Progress Notes (Signed)
*  PRELIMINARY RESULTS* Echocardiogram Echocardiogram Transesophageal has been performed.  Cristela Blue 11/05/2019, 9:08 AM

## 2019-11-07 MED ORDER — EPHEDRINE 5 MG/ML INJ
INTRAVENOUS | Status: AC
  Filled 2019-11-07: qty 10

## 2019-11-26 ENCOUNTER — Ambulatory Visit (INDEPENDENT_AMBULATORY_CARE_PROVIDER_SITE_OTHER): Payer: BLUE CROSS/BLUE SHIELD | Admitting: Family

## 2019-11-26 ENCOUNTER — Other Ambulatory Visit: Payer: Self-pay

## 2019-11-26 ENCOUNTER — Encounter: Payer: Self-pay | Admitting: Family

## 2019-11-26 VITALS — BP 116/80 | HR 67 | Ht 68.0 in | Wt 229.4 lb

## 2019-11-26 DIAGNOSIS — I712 Thoracic aortic aneurysm, without rupture: Secondary | ICD-10-CM

## 2019-11-26 DIAGNOSIS — I10 Essential (primary) hypertension: Secondary | ICD-10-CM

## 2019-11-26 DIAGNOSIS — E785 Hyperlipidemia, unspecified: Secondary | ICD-10-CM | POA: Diagnosis not present

## 2019-11-26 DIAGNOSIS — Q231 Congenital insufficiency of aortic valve: Secondary | ICD-10-CM

## 2019-11-26 DIAGNOSIS — I7121 Aneurysm of the ascending aorta, without rupture: Secondary | ICD-10-CM

## 2019-11-26 NOTE — Progress Notes (Signed)
Office Visit    Patient Name: Jonathan Burke Date of Encounter: 11/26/2019  Primary Care Provider:  Ileana Ladd, MD Primary Cardiologist:  No primary care provider on file. Electrophysiologist:  None   Chief Complaint    Jonathan Burke is a 59 y.o. male with a hx of HTN, ascending aortic aneurysm, mild nonobstructive coronary disease, HLD presents today for follow up after TEE.   Past Medical History    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   Chest pain    Complication of anesthesia    states is hard to wake up   Dental crowns present    also caps   Dilated aortic root (HCC)    Enlarged prostate    GERD (gastroesophageal reflux disease)    daily OTC   H/O hiatal hernia    pt unsure if hernia or not, but has a knot in his stomach   Headache(784.0)    tension   PONV (postoperative nausea and vomiting)    Sleep apnea    pt non compliant with CPAP usage   Small bowel obstruction (HCC) 07/17/2012   Past Surgical History:  Procedure Laterality Date   APPENDECTOMY     CARPAL TUNNEL RELEASE  09/09/2011   Procedure: CARPAL TUNNEL RELEASE;  Surgeon: Wyn Forster., MD;  Location: [facility];  Service: Orthopedics;  Laterality: Left;   CHOLECYSTECTOMY     DIRECT LARYNGOSCOPY  07/07/2001   suspension microdirect laryngoscopy with exc. left vocal cord mass   ESOPHAGOGASTRODUODENOSCOPY N/A 06/05/2013   Procedure: ESOPHAGOGASTRODUODENOSCOPY (EGD);  Surgeon: Vertell Novak., MD;  Location[facility] ENDOSCOPY;  Service: Endoscopy;  Laterality: N/A;   LAPAROSCOPIC LYSIS OF ADHESIONS N/A 05/29/2013   Procedure: DIAGNOSTIC LAPAROSCOPY CONVERTED TO EXPLORATORY LAPAROTOMY, LYSIS OF ADHESIONS ;  Surgeon: Shelly Rubenstein, MD;  Location: [facility] OR;  Service: General;  Laterality: N/A;   LUMBAR LAMINECTOMY/DECOMPRESSION MICRODISCECTOMY  10/29/1999   L5-S1   SHOULDER SURGERY     left    TEE WITHOUT CARDIOVERSION N/A 11/05/2019   Procedure: TRANSESOPHAGEAL ECHOCARDIOGRAM (TEE);  Surgeon: Iran Ouch, MD;  Location: [facility] ORS;  Service: Cardiovascular;  Laterality: N/A;    Allergies  Allergies  Allergen Reactions   Adhesive [Tape] Other (See Comments)    PULLS SKIN OFF. Paper tape only    History of Present Illness    Jonathan Burke is a 59 y.o. male with a hx of HTN, ascending aortic aneurysm, mild nonobstructive coronary disease, HLD. He was last seen for TEE 11/05/19 by Dr. Kirke Corin.  Family history notable of sudden death involving his father and brother.  Cardiac evaluation February 2020 with CTA of coronary arteries.  Calcium score of 74.  Noted mild nonobstructive LAD disease of his not significant by FFR.  There is an incidental finding of ascending aortic aneurysm measuring 4.5 cm.  Repeat CT of the chest in April with aneurysm 4.3 cm.  Previous notes indicate underwent genetic testing for aortopathy which was negative.  Echocardiogram 07/2018 with LVEF 50-55%, mild aortic valve calcification but no mention of aortic valve.  HLD with intolerance to rosuvastatin and atorvastatin due to myalgia. Tolerating pravastatin.   Amlodipine has been previously discontinued due to edema. Atenolol has previously been discontinued due to low blood pressure and orthostatic dizziness.  Lisinopril is been previously discontinued due to cough.  Repeat CTA 08/2019 showing aneurysm 4.6  cm.  He noted a worsening exertional dyspnea.  He was recommended for echocardiogram.   Echo 10/12/2019 with concern for bicuspid aortic valve which would change surgical threshold for aortic aneurysm from 33mm to 4mm -aneurysm by that study measured 47 mm.  He was subsequently recommended for transesophageal echocardiogram. TEE 11/05/19 with LVEF 60-65%, no RWMA, RV normal size and function, no LA/LA appendage thrombus, trivial MR, aortic valve bicuspid (fusion of R and L coronary cusps), mild AV sclerosis  without stenosis, moderate to severe dilation of ascending aorta measuring 29mm, negative bubble study.   We reviewed his transesophageal echocardiogram and plan for referral to cardiothoracic surgery.  He is wife is present for the visit and she is very concerned and wants to make sure he gets prompt evaluation and she worries he will put things off.  Endorses improvement in his cough since switching from lisinopril to losartan but does still endorse some productive cough with solid white phlegm particularly in the evening early morning.  He does endorse waking up with congestion in the morning we discussed adding an over-the-counter antihistamine such as cetirizine or loratadine.  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  We discussed lifting restrictions.  EKGs/Labs/Other Studies Reviewed:   The following studies were reviewed today:  TEE 11/26/2019  1. Left ventricular ejection fraction, by estimation, is 60 to 65%. The  left ventricle has normal function. The left ventricle has no regional  wall motion abnormalities.   2. Right ventricular systolic function is normal. The right ventricular  size is normal.   3. No left atrial/left atrial appendage thrombus was detected.   4. The mitral valve is normal in structure. Trivial mitral valve  regurgitation. No evidence of mitral stenosis.   5. The aortic valve is bicuspid. There is fusion of the right and left  coronary cusps. Aortic valve regurgitation is not visualized. Mild aortic  valve sclerosis is present, with no evidence of aortic valve stenosis.   6. Aortic dilatation noted. There is moderate to severe dilatation of the  ascending aorta measuring 48 mm.   7. Agitated saline contrast bubble study was negative, with no evidence  of any interatrial shunt.   Transthoracic echocardiogram 10/12/2019 1. Left ventricular ejection fraction, by estimation, is 60 to 65%. The  left  ventricle has normal function. The left ventricle has no regional  wall motion abnormalities. Left ventricular diastolic parameters were  normal.   2. Right ventricular systolic function is normal. The right ventricular  size is mildly enlarged. There is normal pulmonary artery systolic  pressure. The estimated right ventricular systolic pressure is 26.2 mmHg.   3. Right atrial size was mildly dilated.   4. The mitral valve is normal in structure. Trivial mitral valve  regurgitation.   5. The left and right coronary cusps appear to be fused. The aortic valve  is probably bicuspid. Aortic valve regurgitation is not visualized.   6. No evidence of coarctation. Aortic dilatation noted. There is moderate  dilatation of the ascending aorta measuring 47 mm.   7. The inferior vena cava is normal in size with greater than 50%  respiratory variability, suggesting right atrial pressure of 3 mmHg.   Comparison(s): Prior images unable to be directly viewed, comparison made  by report only.   FINDINGS   Left Ventricle: Left ventricular ejection fraction, by estimation, is 60  to 65%. The left ventricle has normal function. The left ventricle  has no  regional wall motion abnormalities. The left ventricular internal cavity  size was normal in size. There is   no left ventricular hypertrophy. Left ventricular diastolic parameters  were normal. Normal left ventricular filling pressure.   Right Ventricle: The right ventricular size is mildly enlarged. No  increase in right ventricular wall thickness. Right ventricular systolic  function is normal. There is normal pulmonary artery systolic pressure.  The tricuspid regurgitant velocity is 2.41   m/s, and with an assumed right atrial pressure of 3 mmHg, the estimated  right ventricular systolic pressure is 26.2 mmHg.   Left Atrium: Left atrial size was normal in size.   Right Atrium: Right atrial size was mildly dilated.   Pericardium: There is no  evidence of pericardial effusion.   Mitral Valve: The mitral valve is normal in structure. Trivial mitral  valve regurgitation.   Tricuspid Valve: The tricuspid valve is grossly normal. Tricuspid valve  regurgitation is mild.   Aortic Valve: The left and right coronary cusps appear to be fused. The  aortic valve is bicuspid. . There is moderate thickening and mild  calcification of the aortic valve. Aortic valve regurgitation is not  visualized. There is moderate thickening of the   aortic valve. There is mild calcification of the aortic valve. Aortic  valve mean gradient measures 5.3 mmHg. Aortic valve peak gradient measures  10.6 mmHg. Aortic valve area, by VTI measures 3.60 cm.   Pulmonic Valve: The pulmonic valve was not well visualized. Pulmonic valve  regurgitation is not visualized.   Aorta: No evidence of coarctation. Aortic dilatation noted. There is  moderate dilatation of the ascending aorta measuring 47 mm.   Venous: The inferior vena cava is normal in size with greater than 50%  respiratory variability, suggesting right atrial pressure of 3 mmHg.   IAS/Shunts: No atrial level shunt detected by color flow Doppler.    EKG:  EKG is ordered today.  The ekg ordered today demonstrates NSR 67 bpm with no acute St/T wave changes.   Recent Labs: 08/23/2019: ALT 35 10/31/2019: BUN 15; Creatinine, Ser 0.80; Hemoglobin 15.1; Platelets 291; Potassium 4.5; Sodium 139  Recent Lipid Panel    Component Value Date/Time   CHOL 141 08/23/2019 1158   CHOL 155 09/05/2012 0925   TRIG 63 08/23/2019 1158   TRIG 68 09/05/2012 0925   HDL 39 (L) 08/23/2019 1158   HDL 43 09/05/2012 0925   CHOLHDL 3.6 08/23/2019 1158   LDLCALC 89 08/23/2019 1158   LDLCALC 98 09/05/2012 0925    Home Medications   Current Meds  Medication Sig   Ascorbic Acid (VITAMIN C) 500 MG CAPS Take 1,000 mg by mouth at bedtime.    aspirin EC 81 MG tablet Take 81 mg by mouth daily.   Cholecalciferol (VITAMIN D)  2000 UNITS CAPS Take 4,000 Units by mouth daily.   losartan (COZAAR) 25 MG tablet Take 1 tablet (25 mg total) by mouth daily.   pantoprazole (PROTONIX) 20 MG tablet Take 20 mg by mouth daily.   pravastatin (PRAVACHOL) 20 MG tablet Take 0.5 tablets (10 mg total) by mouth daily.   sildenafil (VIAGRA) 100 MG tablet Take 20 mg by mouth daily as needed for erectile dysfunction.    vitamin B-12 (CYANOCOBALAMIN) 500 MCG tablet Take 500 mcg by mouth daily.   zinc gluconate 50 MG tablet Take 50 mg by mouth at bedtime.    Review of Systems    Review of Systems  Constitutional: Negative for chills,  fever and malaise/fatigue.  Cardiovascular: Negative for chest pain, dyspnea on exertion, leg swelling, near-syncope, orthopnea, palpitations and syncope.  Respiratory: Positive for cough and shortness of breath. Negative for wheezing.   Gastrointestinal: Negative for nausea and vomiting.  Neurological: Negative for dizziness, light-headedness and weakness.   All other systems reviewed and are otherwise negative except as noted above.  Physical Exam    VS:  BP 116/80 (BP Location: Left Arm, Patient Position: Sitting, Cuff Size: Normal)    Pulse 67    Ht 5\' 8"  (1.727 m)    Wt 229 lb 6 oz (104 kg)    SpO2 98%    BMI 34.88 kg/m  , BMI Body mass index is 34.88 kg/m. GEN: Well nourished, well developed, in no acute distress. HEENT: normal. Neck: Supple, no JVD, carotid bruits, or masses. Cardiac: RRR, no murmurs, rubs, or gallops. No clubbing, cyanosis, edema.  Radials/DP/PT 2+ and equal bilaterally.  Respiratory:  Respirations regular and unlabored, clear to auscultation bilaterally. GI: Soft, nontender, nondistended, BS + x 4. MS: No deformity or atrophy. Skin: Warm and dry, no rash. Neuro:  Strength and sensation are intact. Psych: Normal affect.  Assessment & Plan    1. Ascending aortic aneurysm with bicuspid aortic valve -measurement by CT 08/2019 4.6 cm, echo 09/2019 4.7 cm, TEE 6/21 4.8 cm.   TEE 10/2019 confirmed bicuspid aortic valve, mild sclerosis, no aortic stenosis with normal LVEF.  Referred to cardiovascular surgery to establish care as he has had progression in ascending aortic aneurysm and threshold for intervention 5.0 cm in setting of bicuspid aortic valve.  No murmur noted by physical exam.  Avoid lifting more than 20 pounds and avoid fluoroquinolone antibiotics.   2. HTN-blood pressure well controlled.  Cough improving since discontinuation of lisinopril and transition to losartan.  Continue present antihypertensive regimen.  3. Cough -reports improvement in cough since stopping the lisinopril but continued productive cough sometimes in the morning.  Likely etiology postnasal drainage.  Encouraged loratadine or cetirizine.  4. HLD -intolerant to atorvastatin rosuvastatin.  Continue pravastatin.   Disposition: Referred to cardiothoracic surgery.  Follow up in 2 month(s) with Dr. Fletcher Anon or APP  Loel Dubonnet, NP 11/26/2019, 8:38 PM

## 2019-11-26 NOTE — Patient Instructions (Addendum)
Medication Instructions:  - Your physician recommends that you continue on your current medications as directed. Please refer to the Current Medication list given to you today.  *If you need a refill on your cardiac medications before your next appointment, please call your pharmacy*   Lab Work: - none ordered  If you have labs (blood work) drawn today and your tests are completely normal, you will receive your results only by: Marland Kitchen MyChart Message (if you have MyChart) OR . A paper copy in the mail If you have any lab test that is abnormal or we need to change your treatment, we will call you to review the results.   Testing/Procedures: - none ordered   Follow-Up: At [facility], you and your health needs are our priority.  As part of our continuing mission to provide you with exceptional heart care, we have created designated Provider Care Teams.  These Care Teams include your primary Cardiologist (physician) and Advanced Practice Providers (APPs -  Physician Assistants and Nurse Practitioners) who all work together to provide you with the care you need, when you need it.  We recommend signing up for the patient portal called "MyChart".  Sign up information is provided on this After Visit Summary.  MyChart is used to connect with patients for Virtual Visits (Telemedicine).  Patients are able to view lab/test results, encounter notes, upcoming appointments, etc.  Non-urgent messages can be sent to your provider as well.   To learn more about what you can do with MyChart, go to [email or web address].    Your next appointment:   2 month(s)  The format for your next appointment:   In Person  Provider:    You may see Lorine Bears, MD or one of the following Advanced Practice Providers on your designated Care Team:    Nicolasa Ducking, NP  Eula Listen, PA-C  Marisue Ivan, PA-C    Other Instructions 1) - You have been referred to : [facility]  ([facility])- Dr. Evelene Croon - you should be contacted directly by their office to schedule an appointment  [address] [place], [place] [address] [phone]   2) For cough/ allergeries: Try Cetirizine (Zyrtec) or Loratidine (Claritin) over the counter.

## 2019-11-27 ENCOUNTER — Telehealth: Payer: Self-pay

## 2019-11-27 NOTE — Telephone Encounter (Signed)
   Primary Cardiologist: Lorine Bears, MD  Chart reviewed as part of pre-operative protocol coverage. Callback, please find out type of anesthesia. If standard IV sedation, anticipate regular cataract clearance can be provided.  Laurann Montana, PA-C 11/27/2019, 4:50 PM

## 2019-11-27 NOTE — Telephone Encounter (Signed)
   [facility] Pre-operative Risk Assessment    HEARTCARE STAFF: - Please ensure there is not already an duplicate clearance open for this procedure. - Under Visit Info/Reason for Call, type in Other and utilize the format Clearance MM/DD/YY or Clearance TBD. Do not use dashes or single digits. - If request is for dental extraction, please clarify the # of teeth to be extracted.  Request for surgical clearance:  1. What type of surgery is being performed? Cataract surgery   2. When is this surgery scheduled? Right eye 12/11/2019 Left eye 01/01/2020   3. What type of clearance is required (medical clearance vs. Pharmacy clearance to hold med vs. Both)? Both  4. Are there any medications that need to be held prior to surgery and how long? ASA   5. Practice name and name of physician performing surgery? [facility],    6. What is the office phone number? [phone]   7.   What is the office fax number? [phone]  8.   Anesthesia type (None, local, MAC, general) ? Not listed   Jonathan Burke 11/27/2019, 4:18 PM  _________________________________________________________________   (provider comments below)

## 2019-11-27 NOTE — Telephone Encounter (Signed)
Call placed to [facility] to confirm what type of Anesthesia will be used for the Cataract Surgery, left Joni Reining, surgery scheduler a message to call back with that information and to ask for the preop call back pool.

## 2019-11-28 NOTE — Telephone Encounter (Signed)
Follow up  Boneta Lucks from Dr. Gordy Councilman office called back and left a message. She said the anesthesia will use for pt's procedure is versed and if the pt still feel uncomfortable they may add Fentanyl

## 2019-11-28 NOTE — Telephone Encounter (Signed)
   Primary Cardiologist: Lorine Bears, MD  Chart reviewed as part of pre-operative protocol coverage. Cataract extractions are recognized in guidelines as low risk surgeries that do not typically require specific preoperative testing or holding of blood thinner therapy. Therefore, given past medical history and time since last visit, based on ACC/AHA guidelines, BLESSED COTHAM would be at acceptable risk for the planned procedure without further cardiovascular testing.   I will route this recommendation to the requesting party via Epic fax function and remove from pre-op pool.  Please call with questions.  Ronney Asters, NP 11/28/2019, 12:54 PM

## 2019-11-29 NOTE — Telephone Encounter (Signed)
Forwarded to requesting provider via Epic fax function

## 2019-11-29 NOTE — Telephone Encounter (Signed)
Pt notified of clearance

## 2019-12-05 ENCOUNTER — Encounter: Payer: Self-pay | Admitting: Surgery

## 2019-12-05 ENCOUNTER — Other Ambulatory Visit: Payer: Self-pay

## 2019-12-05 ENCOUNTER — Institutional Professional Consult (permissible substitution): Payer: BLUE CROSS/BLUE SHIELD | Admitting: Surgery

## 2019-12-05 VITALS — BP 113/74 | HR 96 | Temp 97.7°F | Resp 16 | Ht 68.0 in | Wt 229.0 lb

## 2019-12-05 DIAGNOSIS — Q231 Congenital insufficiency of aortic valve: Secondary | ICD-10-CM

## 2019-12-05 DIAGNOSIS — I77810 Thoracic aortic ectasia: Secondary | ICD-10-CM | POA: Diagnosis not present

## 2019-12-05 NOTE — Progress Notes (Signed)
Cardiothoracic Surgery Consultation   PCP is Ileana Ladd, MD Referring Provider is Ileana Ladd, MD  Chief Complaint  Patient presents with   TAA    and BICUSPID AORTIC VALVE per ECHO 10/12/19, TEE 11/05/19, CTA CHEST 09/03/19    HPI:  The patient is a 59 year old gentleman who presented in 2019 with bilateral tinnitus and hearing loss.  He had a family history of intracranial aneurysm in his sister and had a MRI of the brain which was unremarkable.  He began having headaches and dizziness and had a CT angio of the neck which showed only a 3.9 cm ascending aortic aneurysm was not completely evaluated on that study.  He subsequently underwent a CTA of the chest on 07/07/2018 to further evaluate his ascending aorta and showed a maximum diameter at the level of the main pulmonary artery was 4.5 cm.  The reported some chest discomfort and was noted to have some coronary calcifications on a CTA of the chest and underwent a gated coronary CT on 07/26/2018.  This showed the maximum diameter of the ascending aorta to be 4.3 cm.  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  An FFR study was performed which showed no obstruction in the LAD system.  He then underwent a follow-up CTA of the chest on 09/08/2018 showed be maximum diameter of the ascending aortic aneurysm to measure 4.3 cm.  Continued follow-up was recommended and he underwent a follow-up scan on 09/03/2019 which showed the maximum diameter of the ascending aorta to be 4.6 cm.  Aortic root appeared normal in caliber at the sinus level.  He had a 2D echocardiogram on 10/12/2019 which showed normal left ventricular systolic function with ejection fraction of 60 to 65%.  The aortic valve is difficult to visualize but appeared probably bicuspid.  He self underwent a TEE on 11/05/2019 to further evaluate the aortic valve and is clearly showed a bicuspid aortic valve with fusion of the right and left coronary cusps.  There is no aortic  insufficiency and mild aortic valve sclerosis without stenosis.  TEE measured the ascending aortic aneurysm at 4.8 cm.   The patient is here today with his wife.  He does report some chest tightness and shortness of breath with exertion.  He has a strenuous job doing heavy physical exertion and is able to do that without much problem.  He has a strong family history of heart disease at an early age with his father dying in his 19s of myocardial infarction.  There is no family history of aneurysm, bicuspid aortic valve, or connective tissue disorder.  The patient said that Dr. Rosemary Holms did a genetic evaluation that ruled out connective tissue disorder.    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   Chest pain    Complication of anesthesia    states is hard to wake up   Dental crowns present    also caps   Dilated aortic root (HCC)    Enlarged prostate    GERD (gastroesophageal reflux disease)    daily OTC   H/O hiatal hernia    pt unsure if hernia or not, but has a knot in his stomach   Headache(784.0)    tension   PONV (postoperative nausea and vomiting)    Sleep apnea    pt non compliant with CPAP usage   Small bowel obstruction (HCC) 07/17/2012  Past Surgical History:  Procedure Laterality Date   APPENDECTOMY     CARPAL TUNNEL RELEASE  09/09/2011   Procedure: CARPAL TUNNEL RELEASE;  Surgeon: Wyn Forsterobert V Sypher Jr., MD;  Location: [facility];  Service: Orthopedics;  Laterality: Left;   CHOLECYSTECTOMY     DIRECT LARYNGOSCOPY  07/07/2001   suspension microdirect laryngoscopy with exc. left vocal cord mass   ESOPHAGOGASTRODUODENOSCOPY N/A 06/05/2013   Procedure: ESOPHAGOGASTRODUODENOSCOPY (EGD);  Surgeon: Vertell NovakJames L Edwards Jr., MD;  [facility];  Service: Endoscopy;  Laterality: N/A;   LAPAROSCOPIC LYSIS OF ADHESIONS N/A 05/29/2013   Procedure: DIAGNOSTIC LAPAROSCOPY CONVERTED TO  EXPLORATORY LAPAROTOMY, LYSIS OF ADHESIONS ;  Surgeon: Shelly Rubensteinouglas A Blackman, MD;  Location: [facility];  Service: General;  Laterality: N/A;   LUMBAR LAMINECTOMY/DECOMPRESSION MICRODISCECTOMY  10/29/1999   L5-S1   SHOULDER SURGERY     left   TEE WITHOUT CARDIOVERSION N/A 11/05/2019   Procedure: TRANSESOPHAGEAL ECHOCARDIOGRAM (TEE);  Surgeon: Iran OuchArida, Muhammad A, MD;  Location: [facility];  Service: Cardiovascular;  Laterality: N/A;    Family History  Problem Relation Age of Onset   COPD Mother    Heart disease Mother    Rectal cancer Mother    Heart attack Father    Arthritis Sister    Hypertension Brother    Hypertension Sister    Anuerysm Sister    Stroke Sister    Arthritis Sister        RA    Social History Social History   Tobacco Use   Smoking status: Never Smoker   Smokeless tobacco: Never Used  Building services engineerVaping Use   Vaping Use: Never used  Substance Use Topics   Alcohol use: No   Drug use: No    Current Outpatient Medications  Medication Sig Dispense Refill   Ascorbic Acid (VITAMIN C) 500 MG CAPS Take 1,000 mg by mouth at bedtime.      aspirin EC 81 MG tablet Take 81 mg by mouth daily.     Cholecalciferol (VITAMIN D) 2000 UNITS CAPS Take 4,000 Units by mouth daily.     losartan (COZAAR) 25 MG tablet Take 1 tablet (25 mg total) by mouth daily. 30 tablet 6   pantoprazole (PROTONIX) 20 MG tablet Take 20 mg by mouth daily.     pravastatin (PRAVACHOL) 20 MG tablet Take 0.5 tablets (10 mg total) by mouth daily. 15 tablet 5   sildenafil (VIAGRA) 100 MG tablet Take 20 mg by mouth daily as needed for erectile dysfunction.      vitamin B-12 (CYANOCOBALAMIN) 500 MCG tablet Take 1,000 mcg by mouth daily.      zinc gluconate 50 MG tablet Take 50 mg by mouth at bedtime.     No current facility-administered medications for this visit.    Allergies  Allergen Reactions   Adhesive [Tape] Other (See Comments)    PULLS SKIN OFF. Paper tape only    Review of  Systems:  Cardiac: Some exertional chest tightness and shortness of breath.  No orthopnea or PND.  No peripheral edema.  No dizziness or syncope. Respiratory: No cough or sputum production.  He has a history of sleep apnea. GI: History of reflux and hiatal hernia. ENT: History of hearing loss.  Last dental visit was 07/2019.  Physical Exam: BP 113/74 (BP Location: Left Arm, Patient Position: Sitting, Cuff Size: Large)    Pulse 96    Temp 97.7 F (36.5 C)    Resp 16    Ht 5\' 8"  (1.727 m)  Wt 229 lb (103.9 kg)    SpO2 97% Comment: RA   BMI 34.82 kg/m  General: Well-developed and in no acute distress. HEENT: Mart[place]ll/AT, PERLA, EOMI, teeth in good condition. Cardiac: Regular rate and rhythm with no murmur. Lungs: Clear Extremities: No peripheral edema   Diagnostic Tests:  Patient Name:  BLUFORD SEDLER Date of Exam: 10/12/2019  Medical Rec #: [id number]   Height:    68.0 in  Accession #:  [id number]   Weight:    229.0 lb  Date of Birth: 15-Sep-1960    BSA:     2.165 m  Patient Age:  58 years    BP:      110/78 mmHg  Patient Gender: M       HR:      69 bpm.  Exam Location: [address]   Procedure: 2D Echo, Cardiac Doppler and Color Doppler   Indications:  R06.00 Shortness of breath    History:    Patient has prior history of Echocardiogram examinations,  most         recent 07/20/2018. CTA of coronaries 2020-Coronary calcium  score         74. nonobstructive LAD disease., CT 08/2019 ascending  aortic         anuerysm was 46 mm., Signs/Symptoms:Shortness of Breath;  Risk         Factors:Hypertension and Dyslipidemia. Previous echo  revealed         MIld LVH, normal RWMA adn LVEF 50-55%. Aortic root 37 mm.    Sonographer:  Farrel Conners RDCS  Referring Phys: 20 MUHAMMAD A ARIDA   IMPRESSIONS    1. Left ventricular ejection fraction, by estimation, is 60 to 65%. The  left  ventricle has normal function. The left ventricle has no regional  wall motion abnormalities. Left ventricular diastolic parameters were  normal.  2. Right ventricular systolic function is normal. The right ventricular  size is mildly enlarged. There is normal pulmonary artery systolic  pressure. The estimated right ventricular systolic pressure is 26.2 mmHg.  3. Right atrial size was mildly dilated.  4. The mitral valve is normal in structure. Trivial mitral valve  regurgitation.  5. The left and right coronary cusps appear to be fused. The aortic valve  is probably bicuspid. Aortic valve regurgitation is not visualized.  6. No evidence of coarctation. Aortic dilatation noted. There is moderate  dilatation of the ascending aorta measuring 47 mm.  7. The inferior vena cava is normal in size with greater than 50%  respiratory variability, suggesting right atrial pressure of 3 mmHg.   Comparison(s): Prior images unable to be directly viewed, comparison made  by report only.   FINDINGS  Left Ventricle: Left ventricular ejection fraction, by estimation, is 60  to 65%. The left ventricle has normal function. The left ventricle has no  regional wall motion abnormalities. The left ventricular internal cavity  size was normal in size. There is  no left ventricular hypertrophy. Left ventricular diastolic parameters  were normal. Normal left ventricular filling pressure.   Right Ventricle: The right ventricular size is mildly enlarged. No  increase in right ventricular wall thickness. Right ventricular systolic  function is normal. There is normal pulmonary artery systolic pressure.  The tricuspid regurgitant velocity is 2.41  m/s, and with an assumed right atrial pressure of 3 mmHg, the estimated  right ventricular systolic pressure is 26.2 mmHg.   Left Atrium: Left atrial size was normal in size.   Right Atrium:  Right atrial size was mildly dilated.   Pericardium: There is no  evidence of pericardial effusion.   Mitral Valve: The mitral valve is normal in structure. Trivial mitral  valve regurgitation.   Tricuspid Valve: The tricuspid valve is grossly normal. Tricuspid valve  regurgitation is mild.   Aortic Valve: The left and right coronary cusps appear to be fused. The  aortic valve is bicuspid. . There is moderate thickening and mild  calcification of the aortic valve. Aortic valve regurgitation is not  visualized. There is moderate thickening of the  aortic valve. There is mild calcification of the aortic valve. Aortic  valve mean gradient measures 5.3 mmHg. Aortic valve peak gradient measures  10.6 mmHg. Aortic valve area, by VTI measures 3.60 cm.   Pulmonic Valve: The pulmonic valve was not well visualized. Pulmonic valve  regurgitation is not visualized.   Aorta: No evidence of coarctation. Aortic dilatation noted. There is  moderate dilatation of the ascending aorta measuring 47 mm.   Venous: The inferior vena cava is normal in size with greater than 50%  respiratory variability, suggesting right atrial pressure of 3 mmHg.   IAS/Shunts: No atrial level shunt detected by color flow Doppler.     LEFT VENTRICLE  PLAX 2D  LVIDd:     5.08 cm Diastology  LVIDs:     3.06 cm LV e' lateral:  12.30 cm/s  LV PW:     1.05 cm LV E/e' lateral: 5.8  LV IVS:    0.89 cm LV e' medial:  9.03 cm/s  LVOT diam:   2.80 cm LV E/e' medial: 7.9  LV SV:     127  LV SV Index:  59  LVOT Area:   6.16 cm               3D Volume EF:             3D EF:    56 %             LV EDV:    179 ml             LV ESV:    78 ml             LV SV:    101 ml   RIGHT VENTRICLE  RV S prime:   11.70 cm/s  TAPSE (M-mode): 2.1 cm   LEFT ATRIUM       Index    RIGHT ATRIUM      Index  LA diam:    3.60 cm 1.66 cm/m RA Area:   19.50 cm  LA Vol  (A2C):  49.6 ml 22.91 ml/m RA Volume:  49.50 ml 22.87 ml/m  LA Vol (A4C):  45.9 ml 21.20 ml/m  LA Biplane Vol: 48.8 ml 22.54 ml/m  AORTIC VALVE  AV Area (Vmax):  3.11 cm  AV Area (Vmean):  3.05 cm  AV Area (VTI):   3.60 cm  AV Vmax:      162.45 cm/s  AV Vmean:     106.524 cm/s  AV VTI:      0.353 m  AV Peak Grad:   10.6 mmHg  AV Mean Grad:   5.3 mmHg  LVOT Vmax:     82.00 cm/s  LVOT Vmean:    52.800 cm/s  LVOT VTI:     0.206 m  LVOT/AV VTI ratio: 0.58    AORTA  Ao Root diam: 3.90 cm  Ao Asc diam: 4.75 cm   MITRAL  VALVE        TRICUSPID VALVE  MV Area (PHT):       TR Peak grad:  23.2 mmHg  MV Decel Time:       TR Vmax:    241.00 cm/s  MV E velocity: 71.30 cm/s  MV A velocity: 61.60 cm/s SHUNTS  MV E/A ratio: 1.16    Systemic VTI: 0.21 m               Systemic Diam: 2.80 cm   Thurmon Fair MD  Electronically signed by Thurmon Fair MD  Signature Date/Time: 10/12/2019/1:12:15 PM     Patient Name:  Jonathan Burke Date of Exam: 11/05/2019  Medical Rec #: [id number]   Height:    68.0 in  Accession #:  [id number]   Weight:    220.0 lb  Date of Birth: [birth year]    BSA:     2.128 m  Patient Age:  59 years    BP:      118/81 mmHg  Patient Gender: M       HR:      85 bpm.  Exam Location: [facility]   Procedure: Transesophageal Echo and Saline Contrast Bubble Study   Indications:   Aortic valve disorder 424.1    History:     Patient has prior history of Echocardiogram examinations,  most          recent 10/12/2019. Signs/Symptoms:Chest Pain; Risk  Factors:Sleep          Apnea. Dilated Aortic root.    Sonographer:   Cristela Blue RDCS (AE)  Referring Phys: [id number] MUHAMMAD A ARIDA  Diagnosing Phys: Lorine Bears MD   PROCEDURE: The transesophogeal probe was passed without difficulty through  the esophogus of the  patient. Local oropharyngeal anesthetic was provided  with Benzocaine spray and viscous lidocaine. Sedation performed by  performing physician. Image quality  was good. The patient's vital signs; including heart rate, blood pressure,  and oxygen saturation; remained stable throughout the procedure. The  patient developed no complications during the procedure.   IMPRESSIONS    1. Left ventricular ejection fraction, by estimation, is 60 to 65%. The  left ventricle has normal function. The left ventricle has no regional  wall motion abnormalities.  2. Right ventricular systolic function is normal. The right ventricular  size is normal.  3. No left atrial/left atrial appendage thrombus was detected.  4. The mitral valve is normal in structure. Trivial mitral valve  regurgitation. No evidence of mitral stenosis.  5. The aortic valve is bicuspid. There is fusion of the right and left  coronary cusps. Aortic valve regurgitation is not visualized. Mild aortic  valve sclerosis is present, with no evidence of aortic valve stenosis.  6. Aortic dilatation noted. There is moderate to severe dilatation of the  ascending aorta measuring 48 mm.  7. Agitated saline contrast bubble study was negative, with no evidence  of any interatrial shunt.   FINDINGS  Left Ventricle: Left ventricular ejection fraction, by estimation, is 60  to 65%. The left ventricle has normal function. The left ventricle has no  regional wall motion abnormalities. The left ventricular internal cavity  size was normal in size. There is  no left ventricular hypertrophy.   Right Ventricle: The right ventricular size is normal. No increase in  right ventricular wall thickness. Right ventricular systolic function is  normal.   Left Atrium: Left atrial size was normal in size. No left atrial/left  atrial appendage thrombus was  detected.   Right Atrium: Right atrial size was normal in size.   Pericardium: There is no  evidence of pericardial effusion.   Mitral Valve: The mitral valve is normal in structure. Normal mobility of  the mitral valve leaflets. Trivial mitral valve regurgitation. No evidence  of mitral valve stenosis.   Tricuspid Valve: The tricuspid valve is normal in structure. Tricuspid  valve regurgitation is trivial. No evidence of tricuspid stenosis.   Aortic Valve: The aortic valve is bicuspid. Aortic valve regurgitation is  not visualized. Mild aortic valve sclerosis is present, with no evidence  of aortic valve stenosis.   Pulmonic Valve: The pulmonic valve was normal in structure. Pulmonic valve  regurgitation is not visualized. No evidence of pulmonic stenosis.   Aorta: Aortic dilatation noted. There is moderate to severe dilatation of  the ascending aorta measuring 48 mm.   Venous: The inferior vena cava was not well visualized.   IAS/Shunts: No atrial level shunt detected by color flow Doppler. Agitated  saline contrast was given intravenously to evaluate for intracardiac  shunting. Agitated saline contrast bubble study was negative, with no  evidence of any interatrial shunt.       AORTA  Ao Root diam: 4.73 cm   Lorine Bears MD  Electronically signed by Lorine Bears MD  Signature Date/Time: 11/05/2019/9:29:15 AM    CLINICAL DATA:  59 year old male undergoing surveillance follow-up for thoracic aortic aneurysm.  EXAM: CT ANGIOGRAPHY CHEST WITH CONTRAST  TECHNIQUE: Multidetector CT imaging of the chest was performed using the standard protocol during bolus administration of intravenous contrast. Multiplanar CT image reconstructions and MIPs were obtained to evaluate the vascular anatomy.  CONTRAST:  80mL OMNIPAQUE IOHEXOL 350 MG/ML SOLN  COMPARISON:  Prior CT chest 09/08/2018  FINDINGS: Cardiovascular: Fusiform aneurysmal dilation of the tubular portion of the ascending thoracic aorta. The maximal transverse diameter is 4.6 cm, slightly enlarged  compared to 4.3 cm previously. The aortic root remains normal in caliber at the sinuses of Valsalva. The aortic valve is diffusely thickened and calcified. The transverse and descending thoracic aorta remain normal in caliber. Conventional 3 vessel arch anatomy. No significant aortic atherosclerotic plaque. Calcifications are present along the coronary arteries. The heart is normal in size. No pericardial effusion.  Mediastinum/Nodes: Unremarkable CT appearance of the thyroid gland. No suspicious mediastinal or hilar adenopathy. No soft tissue mediastinal mass. The thoracic esophagus is unremarkable.  Lungs/Pleura: Lungs are clear. No pleural effusion or pneumothorax.  Upper Abdomen: Surgical changes of prior cholecystectomy. No intra or extrahepatic biliary ductal dilatation. Omental fat containing midline ventral abdominal hernia in the mid abdomen. No acute abnormality within the visualized abdomen.  Musculoskeletal: No acute fracture or aggressive appearing lytic or blastic osseous lesion.  Review of the MIP images confirms the above findings.  IMPRESSION: 1. Enlarging fusiform thoracic aortic aneurysm now measuring up to 4.6 cm. Recommend semi-annual imaging followup by CTA or MRA and referral to cardiothoracic surgery if not already obtained. This recommendation follows 2010 ACCF/AHA/AATS/ACR/ASA/SCA/SCAI/SIR/STS/SVM Guidelines for the Diagnosis and Management of Patients With Thoracic Aortic Disease. Circulation. 2010; 121: Z610-R604. Aortic aneurysm NOS (ICD10-I71.9) 2. Thickened and calcified aortic valve suggests underlying aortic stenosis. 3. Coronary artery calcifications. Please note that although the presence of coronary artery calcium documents the presence of coronary artery disease, the severity of this disease and any potential stenosis cannot be assessed on this non-gated CT examination. Assessment for potential risk factor modification, dietary therapy  or pharmacologic therapy may be warranted, if clinically indicated. 4.  Omental fat containing midline ventral abdominal hernia.  Signed,  Sterling Big, MD, RPVI  [facility]  [facility]   Electronically Signed   By: Malachy Moan M.D.   On: 09/03/2019 09:30   Impression:  This 59 year old gentleman has a functionally bicuspid aortic valve without stenosis or insufficiency and a fusiform ascending aortic aneurysm which was measured at 4.5 cm on CT scan of the chest in February 2020.  A follow-up scan in April 2020 measured aneurysm at 4.3 cm.  His most recent CTA of the chest on 09/03/2019 measured it at 4.6 cm.  His 2D echocardiogram in May 2021 measured at 4.7 cm and TV measured at 4.8 cm on 11/05/2019.  I think the discrepancy is in the 80s measurements over this short period of time or just due to measurement error and differences in measurement that occur with CT versus echocardiogram.  I would consider CT scan much more accurate and the measurement is probably around 4.5 cm.  All these measurements are still below the surgical threshold of 5.0 cm with a bicuspid aortic valve.  I do not think there is any indication for surgical treatment at this time but he will require continued follow-up.  I reviewed all the CT images and echocardiogram with the patient and his wife answered their questions.  I discussed the importance of continued good blood pressure control and preventing further enlargement and acute aortic dissection.  I advised him against doing any heavy lifting that may require a Valsalva maneuver and could raise his blood pressure suddenly.  This is generally around 35 pounds.  I have recommended that he have a follow-up CTA of the chest in 1 year to reassess his aneurysm.  He is in agreement with that.  Plan:  I will see him back in 1 year with a CTA of the chest.  He will continue to follow his primary physician and  Dr. Kirke Corin.  I spent 60 minutes performing this consultation and > 50% of this time was spent face to face counseling and coordinating the care of this patient's ascending aortic aneurysm.   Alleen Borne, MD [facility] [phone]

## 2019-12-06 ENCOUNTER — Encounter: Payer: BLUE CROSS/BLUE SHIELD | Admitting: Surgery

## 2019-12-07 ENCOUNTER — Encounter: Payer: BLUE CROSS/BLUE SHIELD | Admitting: Surgery

## 2020-01-29 ENCOUNTER — Ambulatory Visit: Payer: BLUE CROSS/BLUE SHIELD | Admitting: Cardiovascular Disease

## 2020-01-29 ENCOUNTER — Other Ambulatory Visit: Payer: Self-pay | Admitting: Cardiovascular Disease

## 2020-01-29 DIAGNOSIS — E785 Hyperlipidemia, unspecified: Secondary | ICD-10-CM

## 2020-01-29 NOTE — Telephone Encounter (Signed)
Refill request

## 2020-04-01 ENCOUNTER — Telehealth: Payer: Self-pay | Admitting: Cardiovascular Disease

## 2020-04-01 DIAGNOSIS — U071 COVID-19: Secondary | ICD-10-CM | POA: Diagnosis not present

## 2020-04-01 NOTE — Telephone Encounter (Signed)
Discussed with Jennings Books, NP who sts that it would be fine. Patient made aware by Ladona Ridgel at the front desk.

## 2020-04-01 NOTE — Telephone Encounter (Signed)
Patient has tested positive for covid and is being offered a regen covid injection today at 1 pm. Patient wants to make sure that this is safe due to his enlarged aorta  Please advise

## 2020-04-03 DIAGNOSIS — U071 COVID-19: Secondary | ICD-10-CM | POA: Diagnosis not present

## 2020-04-03 DIAGNOSIS — R0602 Shortness of breath: Secondary | ICD-10-CM | POA: Diagnosis not present

## 2020-04-03 DIAGNOSIS — R0789 Other chest pain: Secondary | ICD-10-CM | POA: Diagnosis not present

## 2020-04-30 ENCOUNTER — Other Ambulatory Visit: Payer: Self-pay | Admitting: Cardiovascular Disease

## 2020-04-30 NOTE — Telephone Encounter (Signed)
Rx request sent to pharmacy.  

## 2020-05-04 DIAGNOSIS — Z6835 Body mass index (BMI) 35.0-35.9, adult: Secondary | ICD-10-CM | POA: Diagnosis not present

## 2020-05-04 DIAGNOSIS — J0100 Acute maxillary sinusitis, unspecified: Secondary | ICD-10-CM | POA: Diagnosis not present

## 2020-05-04 DIAGNOSIS — R059 Cough, unspecified: Secondary | ICD-10-CM | POA: Diagnosis not present

## 2020-05-05 DIAGNOSIS — H2513 Age-related nuclear cataract, bilateral: Secondary | ICD-10-CM | POA: Diagnosis not present

## 2020-05-05 DIAGNOSIS — H2511 Age-related nuclear cataract, right eye: Secondary | ICD-10-CM | POA: Diagnosis not present

## 2020-05-05 DIAGNOSIS — H25013 Cortical age-related cataract, bilateral: Secondary | ICD-10-CM | POA: Diagnosis not present

## 2020-05-05 DIAGNOSIS — I10 Essential (primary) hypertension: Secondary | ICD-10-CM | POA: Diagnosis not present

## 2020-05-05 DIAGNOSIS — H25043 Posterior subcapsular polar age-related cataract, bilateral: Secondary | ICD-10-CM | POA: Diagnosis not present

## 2020-05-27 DIAGNOSIS — H2511 Age-related nuclear cataract, right eye: Secondary | ICD-10-CM | POA: Diagnosis not present

## 2020-05-27 DIAGNOSIS — H2512 Age-related nuclear cataract, left eye: Secondary | ICD-10-CM | POA: Diagnosis not present

## 2020-06-03 DIAGNOSIS — H2512 Age-related nuclear cataract, left eye: Secondary | ICD-10-CM | POA: Diagnosis not present

## 2020-06-03 DIAGNOSIS — H2511 Age-related nuclear cataract, right eye: Secondary | ICD-10-CM | POA: Diagnosis not present

## 2020-06-11 DIAGNOSIS — H2512 Age-related nuclear cataract, left eye: Secondary | ICD-10-CM | POA: Diagnosis not present

## 2020-07-03 ENCOUNTER — Telehealth: Payer: Self-pay | Admitting: *Deleted

## 2020-07-03 NOTE — Telephone Encounter (Signed)
Mr. Huskins wife, Rene Kocher, called regarding test results from May 2021. Unsuccessful attempt at returning phone call. Left VM for return call back.

## 2020-08-05 DIAGNOSIS — H16223 Keratoconjunctivitis sicca, not specified as Sjogren's, bilateral: Secondary | ICD-10-CM | POA: Diagnosis not present

## 2020-08-21 ENCOUNTER — Other Ambulatory Visit: Payer: Self-pay | Admitting: *Deleted

## 2020-08-21 DIAGNOSIS — G4733 Obstructive sleep apnea (adult) (pediatric): Secondary | ICD-10-CM | POA: Diagnosis not present

## 2020-08-21 DIAGNOSIS — I77810 Thoracic aortic ectasia: Secondary | ICD-10-CM

## 2020-09-02 ENCOUNTER — Other Ambulatory Visit: Payer: Self-pay | Admitting: *Deleted

## 2020-09-02 DIAGNOSIS — E785 Hyperlipidemia, unspecified: Secondary | ICD-10-CM

## 2020-09-02 MED ORDER — PRAVASTATIN SODIUM 20 MG PO TABS: 10.0000 mg | ORAL_TABLET | Freq: Every day | ORAL | 0 refills | Status: DC

## 2020-09-10 ENCOUNTER — Ambulatory Visit: Payer: BLUE CROSS/BLUE SHIELD | Admitting: Surgery

## 2020-09-10 ENCOUNTER — Ambulatory Visit
Admission: RE | Admit: 2020-09-10 | Discharge: 2020-09-10 | Disposition: A | Discharge status: Home / Self Care | Payer: BLUE CROSS/BLUE SHIELD | Source: Ambulatory Visit | Attending: Surgery | Admitting: Surgery

## 2020-09-10 ENCOUNTER — Other Ambulatory Visit: Payer: Self-pay

## 2020-09-10 DIAGNOSIS — I700 Atherosclerosis of aorta: Secondary | ICD-10-CM | POA: Diagnosis not present

## 2020-09-10 DIAGNOSIS — I2510 Atherosclerotic heart disease of native coronary artery without angina pectoris: Secondary | ICD-10-CM | POA: Diagnosis not present

## 2020-09-10 DIAGNOSIS — I712 Thoracic aortic aneurysm, without rupture: Secondary | ICD-10-CM | POA: Diagnosis not present

## 2020-09-10 DIAGNOSIS — I358 Other nonrheumatic aortic valve disorders: Secondary | ICD-10-CM | POA: Diagnosis not present

## 2020-09-10 DIAGNOSIS — I77810 Thoracic aortic ectasia: Secondary | ICD-10-CM

## 2020-09-10 MED ORDER — IOPAMIDOL (ISOVUE-370) INJECTION 76%
75.0000 mL | Freq: Once | INTRAVENOUS | Status: AC | PRN
  Administered 2020-09-10: 75 mL via INTRAVENOUS

## 2020-09-11 ENCOUNTER — Encounter: Payer: Self-pay | Admitting: Surgery

## 2020-09-11 ENCOUNTER — Ambulatory Visit: Payer: BLUE CROSS/BLUE SHIELD | Admitting: Surgery

## 2020-09-11 VITALS — BP 133/80 | HR 76 | Temp 98.1°F | Resp 20 | Ht 68.0 in | Wt 234.0 lb

## 2020-09-11 DIAGNOSIS — I712 Thoracic aortic aneurysm, without rupture, unspecified: Secondary | ICD-10-CM

## 2020-09-11 NOTE — Progress Notes (Signed)
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  His 2D echocardiogram in May 2021 measured the aorta at 4.7 cm.  He does report some intermittent left chest pressure and shortness of breath.  He had a prior cardiac work-up in February 2020 with a gated cardiac CTA showing nonobstructive coronary disease in the LAD system with a negative FFR.  Current Outpatient Medications  Medication Sig Dispense Refill  . Ascorbic Acid (VITAMIN C) 500 MG CAPS Take 1,000 mg by mouth at bedtime.     Marland Kitchen aspirin EC 81 MG tablet Take 81 mg by mouth daily.    . Cholecalciferol (VITAMIN D) 2000 UNITS CAPS Take 4,000 Units by mouth daily.    Marland Kitchen losartan (COZAAR) 25 MG tablet TAKE ONE (1) TABLET EACH DAY 30 tablet 6  . pantoprazole (PROTONIX) 20 MG tablet Take 20 mg by mouth daily.    . pravastatin (PRAVACHOL) 20 MG tablet Take 0.5 tablets (10 mg total) by mouth daily. 30 tablet 0  . sildenafil (VIAGRA) 100 MG tablet Take 20 mg by mouth daily as needed for erectile dysfunction.     . vitamin B-12 (CYANOCOBALAMIN) 500 MCG tablet Take 1,000 mcg by mouth daily.     Marland Kitchen zinc gluconate 50 MG tablet Take 50 mg by mouth at bedtime.     No current facility-administered medications for this visit.     Physical Exam: BP 133/80 (BP Location: Right Arm, Patient Position: Sitting, Cuff Size: Large)   Pulse 76   Temp 98.1 F (36.7 C) (Skin)   Resp 20   Ht 5\' 8"  (1.727 m)   Wt 234 lb (106.1 kg)   SpO2 94% Comment: RA  BMI 35.58 kg/m  He looks well. Cardiac exam shows a regular rate and rhythm with normal heart sounds.  There is no murmur. Lungs are clear. There is no peripheral edema  Diagnostic Tests:  CLINICAL DATA:  History of aneurysmal disease of the ascending thoracic aorta.  EXAM: CT ANGIOGRAPHY CHEST WITH CONTRAST  TECHNIQUE: Multidetector  CT imaging of the chest was performed using the standard protocol during bolus administration of intravenous contrast. Multiplanar CT image reconstructions and MIPs were obtained to evaluate the vascular anatomy.  CONTRAST:  14mL ISOVUE-370 IOPAMIDOL (ISOVUE-370) INJECTION 76%  COMPARISON:  Prior CTA of the chest on 09/03/2019 as well as additional prior studies.  FINDINGS: Cardiovascular: The aortic root measures approximately 3.8 cm at the level of the sinuses of Valsalva. The ascending thoracic aorta demonstrates stable dilatation with maximal measured diameter on the current study of approximately 4.5 cm. The proximal arch measures 2.8 cm and the distal arch 2.3 cm. The descending thoracic aorta measures 2.3 cm. There is no evidence of aortic dissection. The aortic valve is calcified and thickened in appearance. Stable mild calcified plaque at the level of the aortic arch. Visualized proximal great vessels demonstrate normal patency and normal branching anatomy.  The heart size is normal. No pericardial fluid identified. Mild amount of calcified plaque visible in the distribution of the LAD. Central pulmonary arteries are normal in caliber.  Mediastinum/Nodes: No enlarged mediastinal, hilar, or axillary lymph nodes. Thyroid gland, trachea, and esophagus demonstrate no significant findings.  Lungs/Pleura: There is no evidence of pulmonary edema, consolidation, pneumothorax, nodule or pleural fluid.  Upper Abdomen: No acute abnormality.  Musculoskeletal: No chest wall abnormality.  No acute or significant osseous findings.  Review of the MIP images confirms the above findings.  IMPRESSION: 1. Stable dilatation of the ascending thoracic aorta with maximal measured diameter on the current study of approximately 4.5 cm. Ascending thoracic aortic aneurysm. Recommend semi-annual imaging followup by CTA or MRA and referral to cardiothoracic surgery if not already  obtained. This recommendation follows 2010 ACCF/AHA/AATS/ACR/ASA/SCA/SCAI/SIR/STS/SVM Guidelines for the Diagnosis and Management of Patients With Thoracic Aortic Disease. Circulation. 2010; 121: O770-H403. Aortic aneurysm NOS (ICD10-I71.9) 2. Calcified and thickened aortic valve. 3. Mild amount of calcified plaque in the distribution of the LAD. 4. Aortic atherosclerosis.  Aortic aneurysm NOS (ICD10-I71.9).   Electronically Signed   By: Irish Lack M.D.   On: 09/10/2020 09:47   Impression:  He has a bicuspid aortic valve with a stable 4.5 cm fusiform ascending aortic aneurysm.  This is well below the surgical threshold of 5 to 5.5 cm for patient with a bicuspid aortic valve.  I reviewed the CTA images with him and answered his questions.  I stressed the importance of continued good blood pressure control in preventing further enlargement and acute aortic dissection.  Plan:  He will continue to follow-up with cardiology.  I will see him back in 1 year with a CTA of the chest.  I spent 15 minutes performing this established patient evaluation and > 50% of this time was spent face to face counseling and coordinating the care of this patient's aortic aneurysm.    Alleen Borne, MD [facility] [phone]

## 2020-09-18 ENCOUNTER — Encounter: Payer: Self-pay | Admitting: Surgery

## 2020-10-04 ENCOUNTER — Other Ambulatory Visit: Payer: Self-pay | Admitting: Cardiovascular Disease

## 2020-10-04 DIAGNOSIS — E785 Hyperlipidemia, unspecified: Secondary | ICD-10-CM

## 2020-10-06 NOTE — Telephone Encounter (Signed)
Pt last seen 10/2019, was supposed to follow-up 2 months after. Please call pt to schedule overdue follow-up appointment with Dr. Kirke Corin or APP. Thank you!

## 2020-10-06 NOTE — Telephone Encounter (Signed)
Scheduled

## 2020-10-07 ENCOUNTER — Ambulatory Visit: Payer: BLUE CROSS/BLUE SHIELD | Admitting: Cardiovascular Disease

## 2020-10-21 DIAGNOSIS — J0100 Acute maxillary sinusitis, unspecified: Secondary | ICD-10-CM | POA: Diagnosis not present

## 2020-10-27 DIAGNOSIS — J40 Bronchitis, not specified as acute or chronic: Secondary | ICD-10-CM | POA: Diagnosis not present

## 2020-10-27 DIAGNOSIS — R059 Cough, unspecified: Secondary | ICD-10-CM | POA: Diagnosis not present

## 2020-10-27 DIAGNOSIS — J9801 Acute bronchospasm: Secondary | ICD-10-CM | POA: Diagnosis not present

## 2020-10-27 DIAGNOSIS — J029 Acute pharyngitis, unspecified: Secondary | ICD-10-CM | POA: Diagnosis not present

## 2020-10-27 NOTE — Progress Notes (Deleted)
Cardiology Office Note   Date:  10/27/2020   ID:  Jonathan Burke, DOB 10-01-1960, MRN [id number]  PCP:  Ileana Ladd, MD  Cardiologist:   Lorine Bears, MD   No chief complaint on file.     History of Present Illness: Jonathan Burke is a 60 y.o. male who presents for a follow-up visit regarding ascending aortic aneurysm. He has known history of essential hypertension.  There is family history of sudden death involving his father and brother. He had cardiac evaluation in February,2020 with CTA of the coronary arteries.  Coronary calcium score was 74.  There was mild nonobstructive LAD disease that was not significant by FFR.  There was incidental finding of ascending aortic aneurysm measured 4.5 cm.  He had a repeat CT of the chest in April and the aneurysm was 4.3 cm.  The notes indicate that he underwent genetic testing for aortopathy which was negative.  Echocardiogram showed an EF was 50 to 55%.  Mild aortic valve calcifications were noted but there was no mention of bicuspid aortic valve. The patient was placed on amlodipine but he had edema and then switched to atenolol and lisinopril.   He is not a smoker.  He had repeat CTA earlier this month which showed that the aneurysm was 4.6 cm.  He has been doing reasonably well with no chest pain.  However, he reports worsening exertional dyspnea.  He did not tolerate rosuvastatin or atorvastatin due to severe myalgia.  He is currently on pravastatin.  Atenolol was discontinued during last visit due to low blood pressure and orthostatic dizziness.    Past Medical History:  Diagnosis Date  . Blood in urine   . 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 Chest pain   . Complication of anesthesia    states is hard to wake up  . Dental crowns present    also caps  . Dilated aortic root (HCC)   . Enlarged prostate   . GERD (gastroesophageal reflux disease)    daily OTC  . H/O hiatal hernia    pt unsure if hernia or not, but  has a knot in his stomach  . Headache(784.0)    tension  . PONV (postoperative nausea and vomiting)   . Sleep apnea    pt non compliant with CPAP usage  . Small bowel obstruction (HCC) 07/17/2012    Past Surgical History:  Procedure Laterality Date  . APPENDECTOMY    . CARPAL TUNNEL RELEASE  09/09/2011   Procedure: CARPAL TUNNEL RELEASE;  Surgeon: Wyn Forster., MD;  Location: [facility];  Service: Orthopedics;  Laterality: Left;  . CHOLECYSTECTOMY    . 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 ESOPHAGOGASTRODUODENOSCOPY N/A 06/05/2013   Procedure: ESOPHAGOGASTRODUODENOSCOPY (EGD);  Surgeon: Vertell Novak., MD;  [facility]: [facility];  Service: Endoscopy;  Laterality: N/A;  . LAPAROSCOPIC LYSIS OF ADHESIONS N/A 05/29/2013   Procedure: DIAGNOSTIC LAPAROSCOPY CONVERTED TO EXPLORATORY LAPAROTOMY, LYSIS OF ADHESIONS ;  Surgeon: Shelly Rubenstein, MD;  Location: [facility] OR;  Service: General;  Laterality: N/A;  . LUMBAR LAMINECTOMY/DECOMPRESSION MICRODISCECTOMY  10/29/1999   L5-S1  . SHOULDER SURGERY     left  . TEE WITHOUT CARDIOVERSION N/A 11/05/2019   Procedure: TRANSESOPHAGEAL ECHOCARDIOGRAM (TEE);  Surgeon: Iran Ouch, MD;  Location: [facility] ORS;  Service: Cardiovascular;  Laterality: N/A;     Current Outpatient Medications  Medication Sig Dispense Refill  . Ascorbic Acid (VITAMIN C) 500 MG CAPS Take 1,000 mg by mouth at bedtime.     Marland Kitchen aspirin EC 81 MG tablet Take 81 mg by mouth daily.    . Cholecalciferol (VITAMIN D) 2000 UNITS CAPS Take 4,000 Units by mouth daily.    Marland Kitchen losartan (COZAAR) 25 MG tablet TAKE ONE (1) TABLET EACH DAY 30 tablet 6  . pantoprazole (PROTONIX) 20 MG tablet Take 20 mg by mouth daily.    . pravastatin (PRAVACHOL) 20 MG tablet TAKE 1/2 TABLET DAILY 15 tablet 0  . sildenafil (VIAGRA) 100 MG tablet Take 20 mg by mouth daily as needed for erectile dysfunction.     . vitamin B-12  (CYANOCOBALAMIN) 500 MCG tablet Take 1,000 mcg by mouth daily.     Marland Kitchen zinc gluconate 50 MG tablet Take 50 mg by mouth at bedtime.     No current facility-administered medications for this visit.    Allergies:   Adhesive [tape]    Social History:  The patient  reports that he has never smoked. He has never used smokeless tobacco. He reports that he does not drink alcohol and does not use drugs.   Family History:  The patient's family history includes Anuerysm in his sister; Arthritis in his sister and sister; COPD in his mother; Heart attack in his father; Heart disease in his mother; Hypertension in his brother and sister; Rectal cancer in his mother; Stroke in his sister.    ROS:  Please see the history of present illness.   Otherwise, review of systems are positive for none.   All other systems are reviewed and negative.    PHYSICAL EXAM: VS:  There were no vitals taken for this visit. , BMI There is no height or weight on file to calculate BMI. GEN: Well nourished, well developed, in no acute distress  HEENT: normal  Neck: no JVD, carotid bruits, or masses Cardiac: RRR; no murmurs, rubs, or gallops,no edema  Respiratory:  clear to auscultation bilaterally, normal work of breathing GI: soft, nontender, nondistended, + BS MS: no deformity or atrophy  Skin: warm and dry, no rash Neuro:  Strength and sensation are intact Psych: euthymic mood, full affect   EKG:  EKG is not ordered today.   Recent Labs: 10/31/2019: BUN 15; Creatinine, Ser 0.80; Hemoglobin 15.1; Platelets 291; Potassium 4.5; Sodium 139    Lipid Panel    Component Value Date/Time   CHOL 141 08/23/2019 1158   CHOL 155 09/05/2012 0925   TRIG 63 08/23/2019 1158   TRIG 68 09/05/2012 0925   HDL 39 (L) 08/23/2019 1158   HDL 43 09/05/2012 0925   CHOLHDL 3.6 08/23/2019 1158   LDLCALC 89 08/23/2019 1158   LDLCALC 98 09/05/2012 0925      Wt Readings from Last 3 Encounters:  09/11/20 234 lb (106.1 kg)  12/05/19  229 lb (103.9 kg)  11/26/19 229 lb 6 oz (104 kg)       No flowsheet data found.    ASSESSMENT AND PLAN:  1. Ascending aortic aneurysm: Most recent CT showed slight progression to 4.6 cm.  There might be some variability in measurement as his initial measurement last year was 4.5 cm.  His wife was very concerned about the growth but I explained to her that this is still considered not a significant growth.    No evidence of Marfan's, connective tissue disease or bicuspid aortic valve.  Thus, the threshold for surgical intervention is 5.5  cm.  No murmurs by physical exam.  Avoid heavy lifting of more than 20 pounds and avoid fluoroquinolones antibiotics.  Repeat CT study later this year. 2. Essential hypertension: Blood pressure is controlled on lisinopril 3. Hyperlipidemia: Intolerance to atorvastatin and rosuvastatin but currently tolerating pravastatin. 4. Shortness of breath: Some worsening since last visit.  I requested an echocardiogram.    Disposition:   FU with me in 6 months  Signed,  Lorine Bears, MD  10/27/2020 2:53 PM    [facility]

## 2020-10-28 ENCOUNTER — Ambulatory Visit: Payer: BLUE CROSS/BLUE SHIELD | Admitting: Cardiovascular Disease

## 2020-11-10 DIAGNOSIS — H18593 Other hereditary corneal dystrophies, bilateral: Secondary | ICD-10-CM | POA: Diagnosis not present

## 2020-11-25 ENCOUNTER — Other Ambulatory Visit: Payer: Self-pay

## 2020-11-25 ENCOUNTER — Ambulatory Visit: Payer: BC Managed Care – PPO | Admitting: Cardiovascular Disease

## 2020-11-25 ENCOUNTER — Encounter: Payer: Self-pay | Admitting: Cardiovascular Disease

## 2020-11-25 VITALS — BP 118/68 | Resp 18 | Ht 68.0 in | Wt 238.0 lb

## 2020-11-25 DIAGNOSIS — I712 Thoracic aortic aneurysm, without rupture: Secondary | ICD-10-CM

## 2020-11-25 DIAGNOSIS — E785 Hyperlipidemia, unspecified: Secondary | ICD-10-CM | POA: Diagnosis not present

## 2020-11-25 DIAGNOSIS — R0602 Shortness of breath: Secondary | ICD-10-CM | POA: Diagnosis not present

## 2020-11-25 DIAGNOSIS — R079 Chest pain, unspecified: Secondary | ICD-10-CM

## 2020-11-25 DIAGNOSIS — Q231 Congenital insufficiency of aortic valve: Secondary | ICD-10-CM

## 2020-11-25 DIAGNOSIS — I10 Essential (primary) hypertension: Secondary | ICD-10-CM

## 2020-11-25 DIAGNOSIS — I7121 Aneurysm of the ascending aorta, without rupture: Secondary | ICD-10-CM

## 2020-11-25 NOTE — Progress Notes (Signed)
Cardiology Office Note   Date:  11/25/2020   ID:  Jonathan Burke, DOB December 14, 1960, MRN [id number]  PCP:  Ileana Ladd, MD  Cardiologist:   Lorine Bears, MD   No chief complaint on file.     History of Present Illness: Jonathan Burke is a 60 y.o. male who presents for a follow-up visit regarding mild coronary atherosclerosis, bicuspid aortic valve and ascending aortic aneurysm. He has known history of essential hypertension.  There is family history of sudden death involving his father and brother. He had cardiac evaluation in February,2020 with CTA of the coronary arteries.  Coronary calcium score was 74.  There was mild nonobstructive LAD disease that was not significant by FFR.  There was incidental finding of ascending aortic aneurysm measured 4.5 cm.   The notes indicate that he underwent genetic testing for aortopathy which was negative.  Echocardiogram showed an EF was 50 to 55%.  Mild aortic valve calcifications were noted. The patient was placed on amlodipine but he had edema and then switched to atenolol and lisinopril.   He is not a smoker.  He did not tolerate rosuvastatin or atorvastatin due to severe myalgia.  He is currently on pravastatin.  Atenolol was discontinued due to low blood pressure and orthostatic dizziness.  A transesophageal echocardiogram was done in June 2021 which showed an EF of 60 to 65%, bicuspid aortic valve with fusion of the right and left coronary cusps with mild aortic valve calcifications without significant stenosis.  The ascending aorta measured 48 mm.  The patient has been following with Dr. Laneta Simmers.  Most recent CTA showed stable ascending thoracic aortic aneurysm with maximum diameter of 4.5 cm.  He reports worsening exertional dyspnea since last visit with intermittent episodes of sharp chest discomfort.  He gained 10 pounds since last year.  He has been feeling more tired since last year after he had COVID infection.  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/2011   Chest pain    Complication of anesthesia    states is hard to wake up   Dental crowns present    also caps   Dilated aortic root (HCC)    Enlarged prostate    GERD (gastroesophageal reflux disease)    daily OTC   H/O hiatal hernia    pt unsure if hernia or not, but has a knot in his stomach   Headache(784.0)    tension   PONV (postoperative nausea and vomiting)    Sleep apnea    pt non compliant with CPAP usage   Small bowel obstruction (HCC) 07/17/2012    Past Surgical History:  Procedure Laterality Date   APPENDECTOMY     CARPAL TUNNEL RELEASE  09/09/2011   Procedure: CARPAL TUNNEL RELEASE;  Surgeon: Wyn Forster., MD;  Location: [facility];  Service: Orthopedics;  Laterality: Left;   CHOLECYSTECTOMY     DIRECT LARYNGOSCOPY  07/07/2001   suspension microdirect laryngoscopy with exc. left vocal cord mass   ESOPHAGOGASTRODUODENOSCOPY N/A 06/05/2013   Procedure: ESOPHAGOGASTRODUODENOSCOPY (EGD);  Surgeon: Vertell Novak., MD;  Location: [facility] END[facility]COPY;  Service: Endoscopy;  Laterality: N/A;   LAPAROSCOPIC LYSIS OF ADHESIONS N/A 05/29/2013   Procedure: DIAGNOSTIC LAPAROSCOPY CONVERTED TO EXPLORATORY LAPAROTOMY, LYSIS OF ADHESIONS ;  Surgeon: Shelly Rubenstein, MD;  Location: [facility] OR;  Service: General;  Laterality: N/A;   LUMBAR LAMINECTOMY/DECOMPRESSION MICRODISCECTOMY  10/29/1999   L5-S1   SHOULDER SURGERY     left   TEE WITHOUT CARDIOVERSION N/A 11/05/2019   Procedure: TRANSESOPHAGEAL ECHOCARDIOGRAM (TEE);  Surgeon: Iran Ouch, MD;  Location: [facility] ORS;  Service: Cardiovascular;  Laterality: N/A;     Current Outpatient Medications  Medication Sig Dispense Refill   Ascorbic Acid (VITAMIN C) 500 MG CAPS Take 1,000 mg by mouth at bedtime.      aspirin EC 81 MG tablet Take 81 mg by mouth daily.     Cholecalciferol (VITAMIN D) 2000 UNITS CAPS Take 4,000 Units by  mouth daily.     losartan (COZAAR) 25 MG tablet TAKE ONE (1) TABLET EACH DAY 30 tablet 6   pantoprazole (PROTONIX) 20 MG tablet Take 20 mg by mouth daily.     pravastatin (PRAVACHOL) 20 MG tablet TAKE 1/2 TABLET DAILY 15 tablet 0   sildenafil (VIAGRA) 100 MG tablet Take 20 mg by mouth daily as needed for erectile dysfunction.      vitamin B-12 (CYANOCOBALAMIN) 500 MCG tablet Take 1,000 mcg by mouth daily.      zinc gluconate 50 MG tablet Take 50 mg by mouth at bedtime.     No current facility-administered medications for this visit.    Allergies:   Adhesive [tape]    Social History:  The patient  reports that he has never smoked. He has never used smokeless tobacco. He reports that he does not drink alcohol and does not use drugs.   Family History:  The patient's family history includes Anuerysm in his sister; Arthritis in his sister and sister; COPD in his mother; Heart attack in his father; Heart disease in his mother; Hypertension in his brother and sister; Rectal cancer in his mother; Stroke in his sister.    ROS:  Please see the history of present illness.   Otherwise, review of systems are positive for none.   All other systems are reviewed and negative.    PHYSICAL EXAM: VS:  BP 118/68 (BP Location: Left Arm, Patient Position: Sitting, Cuff Size: Normal)   Resp 18   Ht 5\' 8"  (1.727 m)   Wt 238 lb (108 kg)   SpO2 96%   BMI 36.19 kg/m  , BMI Body mass index is 36.19 kg/m. GEN: Well nourished, well developed, in no acute distress  HEENT: normal  Neck: no JVD, carotid bruits, or masses Cardiac: RRR; no murmurs, rubs, or gallops,no edema  Respiratory:  clear to auscultation bilaterally, normal work of breathing GI: soft, nontender, nondistended, + BS MS: no deformity or atrophy  Skin: warm and dry, no rash Neuro:  Strength and sensation are intact Psych: euthymic mood, full affect   EKG:  EKG is ordered today. EKG showed normal sinus rhythm with no significant ST or T  wave changes.   Recent Labs: No results found for requested labs within last 8760 hours.    Lipid Panel    Component Value Date/Time   CHOL 141 08/23/2019 1158   CHOL 155 09/05/2012 0925   TRIG 63 08/23/2019 1158   TRIG 68 09/05/2012 0925   HDL 39 (L) 08/23/2019 1158   HDL 43 09/05/2012 0925   CHOLHDL 3.6 08/23/2019 1158   LDLCALC 89 08/23/2019 1158   LDLCALC 98 09/05/2012 0925      Wt Readings from Last 3 Encounters:  11/25/20 238 lb (108 kg)  09/11/20 234 lb (106.1 kg)  12/05/19 229 lb (103.9 kg)       No flowsheet data found.  ASSESSMENT AND PLAN:  1. Ascending aortic aneurysm: I reviewed most recent CTA which showed stable size of 4.5 cm.  Repeat study in 1 year.  He does have bicuspid aortic valve and thus the threshold for surgical repair is 5 cm.   Continue to avoid heavy lifting of more than 20 pounds and avoid fluoroquinolones antibiotics.    2. Essential hypertension: His blood pressure remains well controlled and I advised him to continue lisinopril.  3. Hyperlipidemia: He did not tolerate atorvastatin or rosuvastatin in the past due to myalgia but he is tolerating pravastatin which will be continued.   4.  Worsening exertional dyspnea and atypical chest pain: The patient had mild LAD disease in 2020 on cardiac CTA.  We have to exclude the possibility of progression and thus I requested a Lexiscan Myoview.  He does not think he can get his heart rate up enough on a treadmill.  5.  Bicuspid aortic valve: This was not associated with significant regurgitation or stenosis.    Disposition:   FU with me in 6 months  Signed,  Lorine Bears, MD  11/25/2020 9:28 AM    [facility]

## 2020-11-25 NOTE — Patient Instructions (Signed)
Medication Instructions:  No changes *If you need a refill on your cardiac medications before your next appointment, please call your pharmacy*   Lab Work: None ordered If you have labs (blood work) drawn today and your tests are completely normal, you will receive your results only by: MyChart Message (if you have MyChart) OR A paper copy in the mail If you have any lab test that is abnormal or we need to change your treatment, we will call you to review the results.   Testing/Procedures: Your physician has requested that you have a lexiscan myoview. For further information please visit [email or web address] Please follow instruction sheet, as given. This will take place at [address] [facility] [place], [address]  How to prepare for your Myocardial Perfusion Test: Do not eat or drink 3 hours prior to your test, except you may have water. Do not consume products containing caffeine (regular or decaffeinated) 12 hours prior to your test. (ex: coffee, chocolate, sodas, tea). Do bring a list of your current medications with you.  If not listed below, you may take your medications as normal. Do wear comfortable clothes (no dresses or overalls) and walking shoes, tennis shoes preferred (No heels or open toe shoes are allowed). Do NOT wear cologne, perfume, aftershave, or lotions (deodorant is allowed). The test will take approximately 3 to 4 hours to complete If these instructions are not followed, your test will have to be rescheduled.  Follow-Up: At [facility] you and your health needs are our priority.  As part of our continuing mission to provide you with exceptional heart care, we have created designated Provider Care Teams.  These Care Teams include your primary Cardiologist (physician) and Advanced Practice Providers (APPs -  Physician Assistants and Nurse Practitioners) who all work together to provide you with the care you need, when you need it.  We recommend signing up for the patient  portal called "MyChart".  Sign up information is provided on this After Visit Summary.  MyChart is used to connect with patients for Virtual Visits (Telemedicine).  Patients are able to view lab/test results, encounter notes, upcoming appointments, etc.  Non-urgent messages can be sent to your provider as well.   To learn more about what you can do with MyChart, go to [email or web address].    Your next appointment:   6 month(s)  The format for your next appointment:   In Person  Provider:   Lorine Bears, MD

## 2020-11-26 DIAGNOSIS — N401 Enlarged prostate with lower urinary tract symptoms: Secondary | ICD-10-CM | POA: Diagnosis not present

## 2020-11-26 DIAGNOSIS — R3912 Poor urinary stream: Secondary | ICD-10-CM | POA: Diagnosis not present

## 2020-12-03 ENCOUNTER — Telehealth (HOSPITAL_COMMUNITY): Payer: Self-pay | Admitting: *Deleted

## 2020-12-03 NOTE — Telephone Encounter (Signed)
Close encounter 

## 2020-12-05 ENCOUNTER — Ambulatory Visit (HOSPITAL_COMMUNITY)
Admission: RE | Admit: 2020-12-05 | Discharge: 2020-12-05 | Disposition: A | Discharge status: Home / Self Care | Payer: BC Managed Care – PPO | Source: Ambulatory Visit | Attending: Cardiology | Admitting: Cardiology

## 2020-12-05 ENCOUNTER — Other Ambulatory Visit: Payer: Self-pay

## 2020-12-05 DIAGNOSIS — R079 Chest pain, unspecified: Secondary | ICD-10-CM | POA: Diagnosis not present

## 2020-12-05 DIAGNOSIS — R0602 Shortness of breath: Secondary | ICD-10-CM

## 2020-12-05 LAB — MYOCARDIAL PERFUSION IMAGING
LV dias vol: 140 mL (ref 62–150)
LV sys vol: 70 mL
Peak HR: 88 {beats}/min
Rest HR: 69 {beats}/min
SDS: 0
SRS: 1
SSS: 1
TID: 1.1

## 2020-12-05 MED ORDER — TECHNETIUM TC 99M TETROFOSMIN IV KIT
31.2000 | PACK | Freq: Once | INTRAVENOUS | Status: AC | PRN
  Administered 2020-12-05: 31.2 via INTRAVENOUS
  Filled 2020-12-05: qty 32

## 2020-12-05 MED ORDER — TECHNETIUM TC 99M TETROFOSMIN IV KIT
10.5000 | PACK | Freq: Once | INTRAVENOUS | Status: AC | PRN
  Administered 2020-12-05: 10.5 via INTRAVENOUS
  Filled 2020-12-05: qty 11

## 2020-12-05 MED ORDER — REGADENOSON 0.4 MG/5ML IV SOLN
0.4000 mg | Freq: Once | INTRAVENOUS | Status: AC
Start: 2020-12-05 — End: 2020-12-05
  Administered 2020-12-05: 0.4 mg via INTRAVENOUS

## 2020-12-11 ENCOUNTER — Other Ambulatory Visit: Payer: Self-pay

## 2020-12-11 DIAGNOSIS — E785 Hyperlipidemia, unspecified: Secondary | ICD-10-CM

## 2020-12-11 MED ORDER — PRAVASTATIN SODIUM 20 MG PO TABS: 10.0000 mg | ORAL_TABLET | Freq: Every day | ORAL | 3 refills | Status: DC

## 2021-01-05 ENCOUNTER — Other Ambulatory Visit: Payer: Self-pay

## 2021-01-05 ENCOUNTER — Other Ambulatory Visit: Payer: Self-pay | Admitting: Cardiovascular Disease

## 2021-02-25 ENCOUNTER — Telehealth: Payer: Self-pay | Admitting: Cardiovascular Disease

## 2021-02-25 NOTE — Telephone Encounter (Signed)
*  STAT* If patient is at the pharmacy, call can be transferred to refill team.   1. Which medications need to be refilled? (please list name of each medication and dose if known)  pantoprazole (PROTONIX) 20 MG tablet  2. Which pharmacy/location (including street and city if local pharmacy) is medication to be sent to? [facility] [place], [place] - [address]  3. Do they need a 30 day or 90 day supply? 90 day refill

## 2021-02-26 ENCOUNTER — Other Ambulatory Visit: Payer: Self-pay

## 2021-02-26 MED ORDER — PANTOPRAZOLE SODIUM 20 MG PO TBEC: 20.0000 mg | DELAYED_RELEASE_TABLET | Freq: Every day | ORAL | 0 refills | Status: DC

## 2021-02-26 NOTE — Telephone Encounter (Signed)
Patient's wife was calling to make sure a prescription was put in because patient is has about 2 pills left

## 2021-04-21 ENCOUNTER — Other Ambulatory Visit: Payer: Self-pay | Admitting: Cardiovascular Disease

## 2021-04-27 ENCOUNTER — Other Ambulatory Visit: Payer: Self-pay | Admitting: Cardiovascular Disease

## 2021-05-04 IMAGING — CT CT ABD-PELV W/ CM
2 of 5 series · 17 of 46 positions shown, 19 images · IV contrast (omnipaque)
Comparison: 07/06/2016

CLINICAL DATA: Abdominal pain for several days

EXAM:
CT ABDOMEN AND PELVIS WITH CONTRAST
TECHNIQUE: Multidetector CT imaging of the abdomen and pelvis was performed
using the standard protocol following bolus administration of
intravenous contrast.
CONTRAST:  100mL OMNIPAQUE IOHEXOL 300 MG/ML  SOLN

[Series 2: axial st · axial · 0.92mm/px · z∈[-472,-42]mm · 14 of 98 slices shown, 16 images]
[im 6/98  soft-tissue]
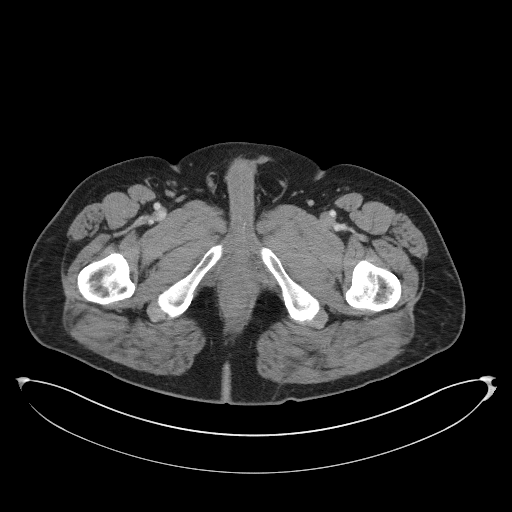
[im 6/98  bone]
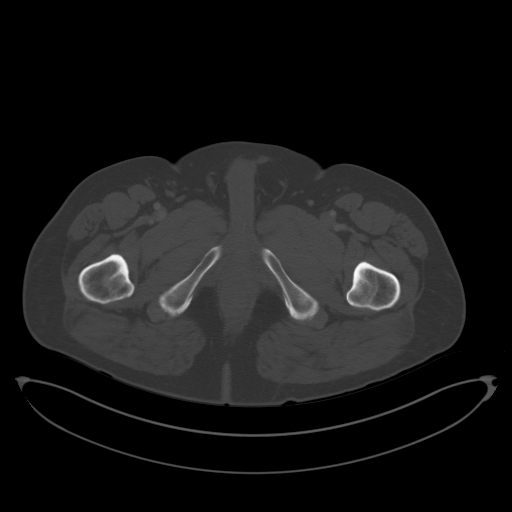
[im 11/98  soft-tissue]
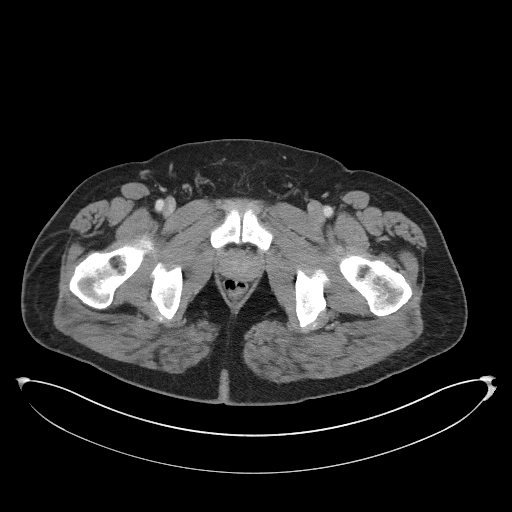
[im 22/98  soft-tissue]
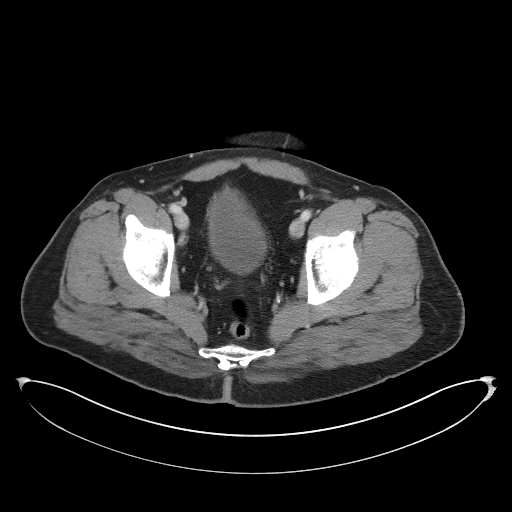
[im 27/98  soft-tissue]
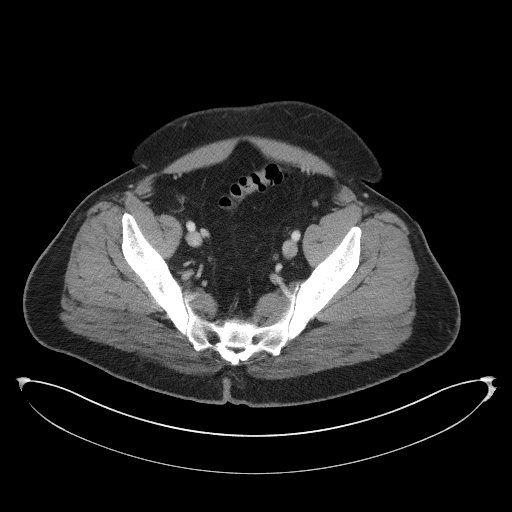
[im 33/98  soft-tissue]
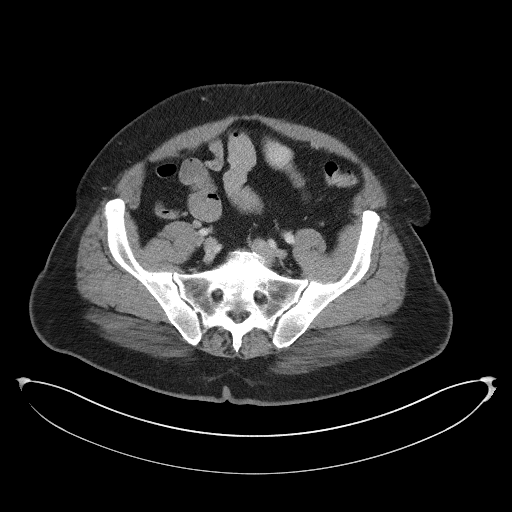
[im 38/98  soft-tissue]
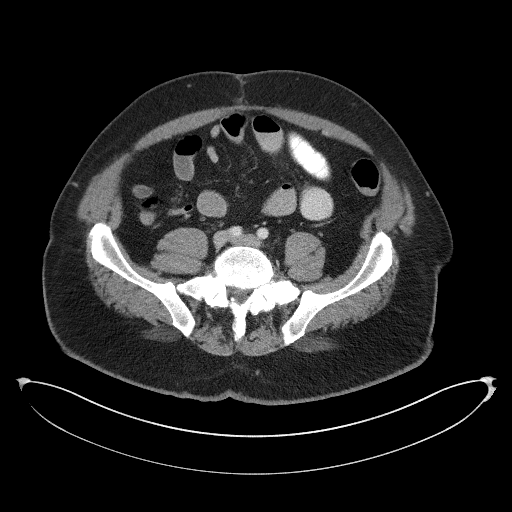
[im 44/98  soft-tissue]
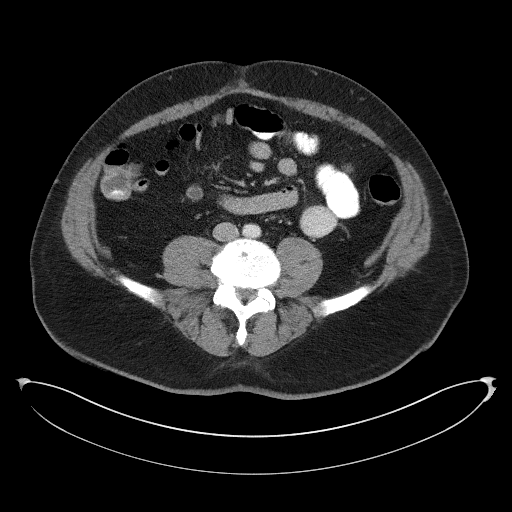
[im 54/98  soft-tissue]
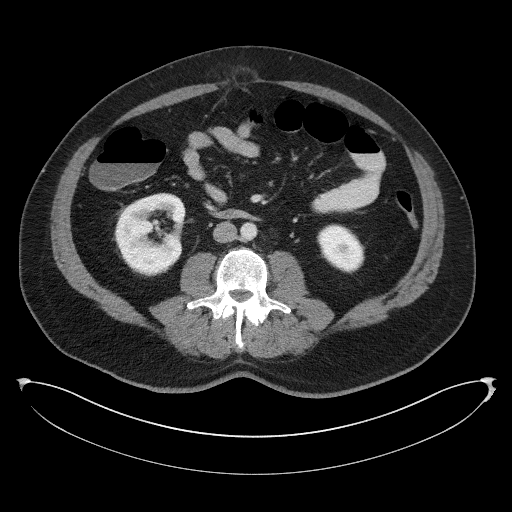
[im 60/98  soft-tissue]
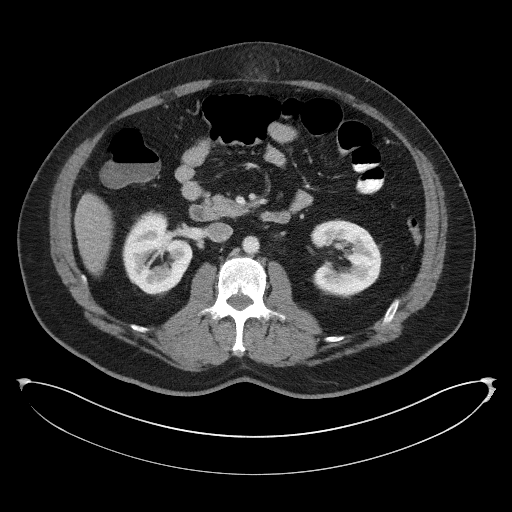
[im 60/98  bone]
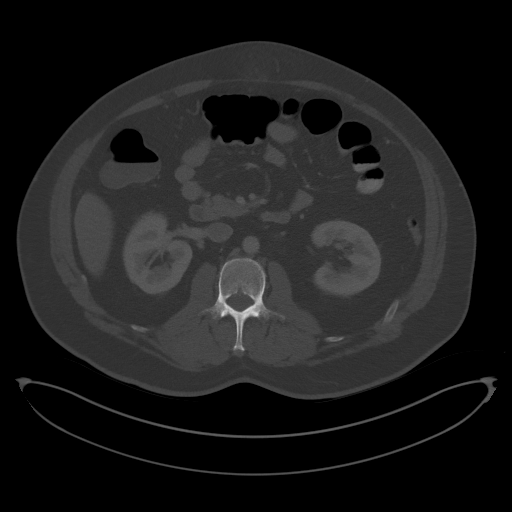
[im 65/98  soft-tissue]
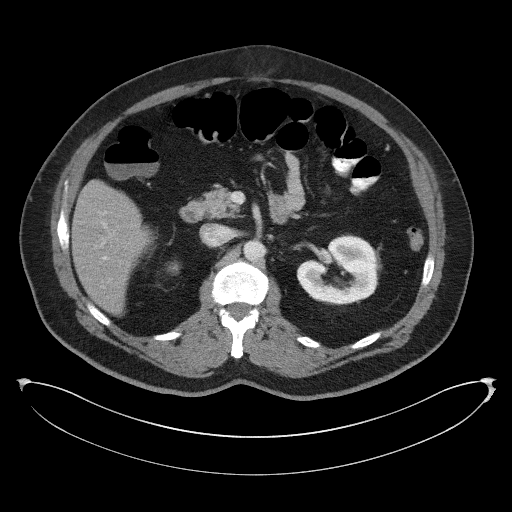
[im 71/98  soft-tissue]
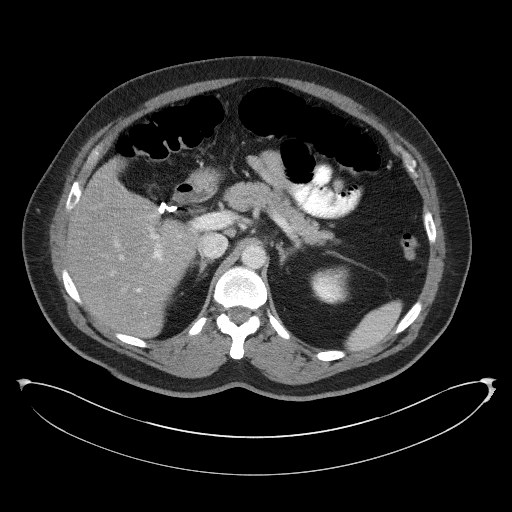
[im 76/98  soft-tissue]
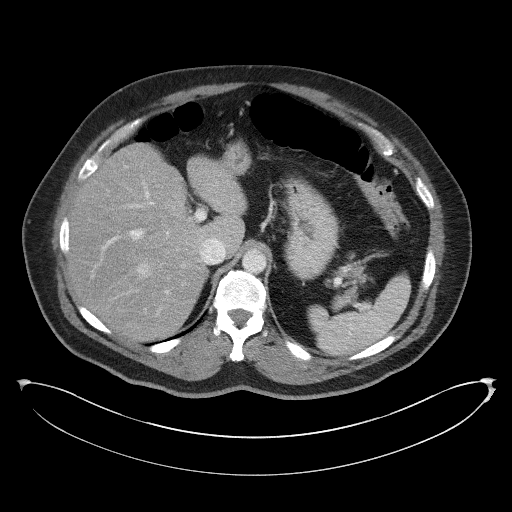
[im 87/98  soft-tissue]
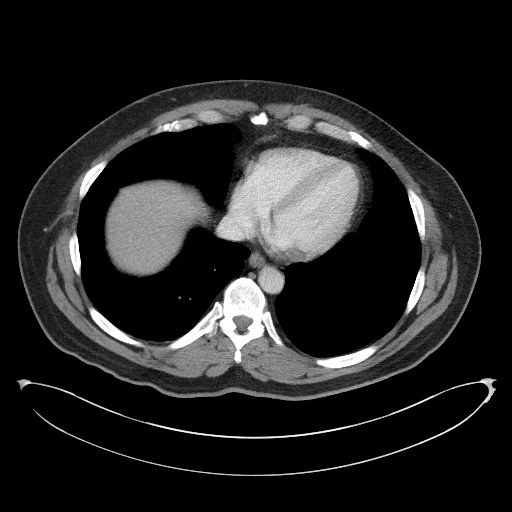
[im 92/98  soft-tissue]
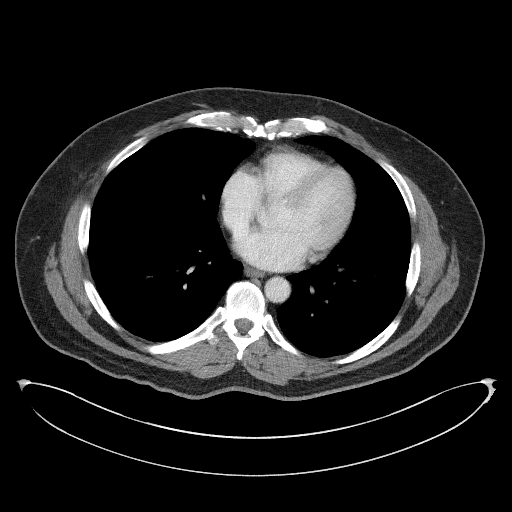

[Series 5: coronal st · coronal · 0.90mm/px · 3 of 98 slices shown]
[im 33/98  soft-tissue]
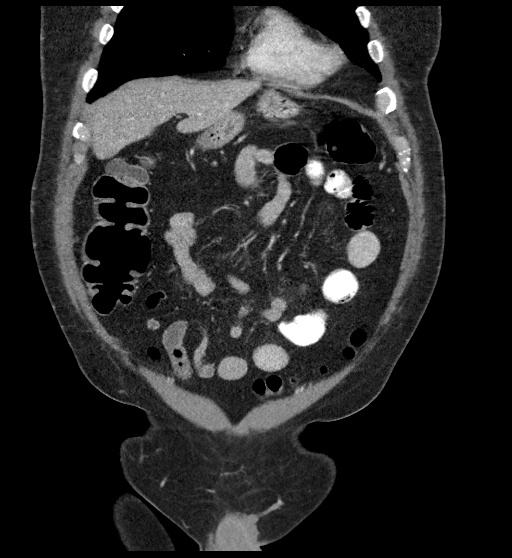
[im 44/98  soft-tissue]
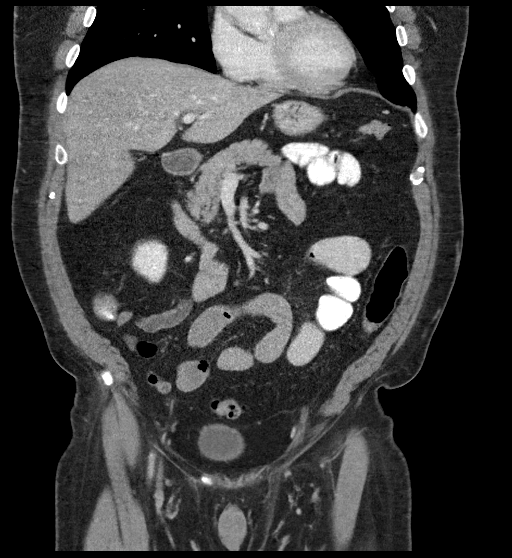
[im 54/98  soft-tissue]
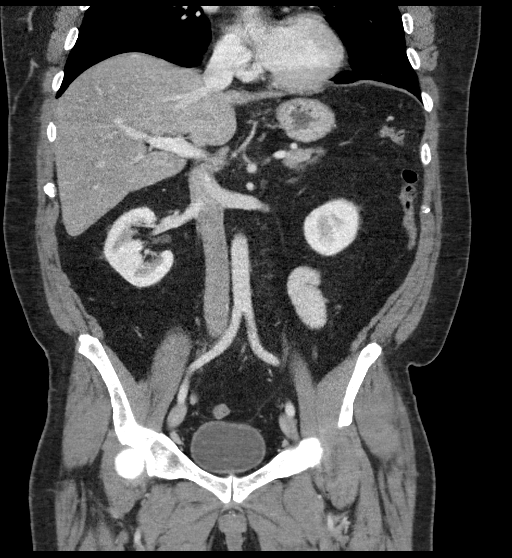

[17 of 46 positions shown; findings below may reference images not displayed]

FINDINGS: Lower chest: No acute abnormality.

Hepatobiliary: Fatty infiltration of the liver is noted. The
gallbladder has been surgically removed.

Pancreas: Unremarkable. No pancreatic ductal dilatation or
surrounding inflammatory changes.

Spleen: Normal in size without focal abnormality.

Adrenals/Urinary Tract: Adrenal glands are within normal limits.
Kidneys demonstrate a normal enhancement pattern bilaterally. No
renal calculi or obstructive changes are seen. Normal excretion of
contrast is noted on delayed images. The bladder is partially
distended.

Stomach/Bowel: Colon is well visualized and predominately
decompressed. No obstructive or inflammatory changes are seen. The
appendix has been surgically removed. The stomach and small bowel
show no obstructive changes.

Vascular/Lymphatic: No significant vascular findings are present. No
enlarged abdominal or pelvic lymph nodes.

Reproductive: Prostate is unremarkable.

Other: No free fluid is noted. Small fat containing supraumbilical
hernias are identified. These are stable in appearance from the
prior exam. No incarcerated bowel loops are seen.

Musculoskeletal: Degenerative changes of the lumbar spine are noted.
IMPRESSION: No acute abnormality noted.

Fatty liver.

Stable fat containing supraumbilical hernias

## 2021-05-12 ENCOUNTER — Encounter: Payer: Self-pay | Admitting: Cardiovascular Disease

## 2021-05-12 ENCOUNTER — Ambulatory Visit: Payer: BC Managed Care – PPO | Admitting: Cardiovascular Disease

## 2021-05-12 ENCOUNTER — Other Ambulatory Visit: Payer: Self-pay

## 2021-05-12 VITALS — BP 124/78 | HR 76 | Ht 68.0 in | Wt 235.6 lb

## 2021-05-12 DIAGNOSIS — Q231 Congenital insufficiency of aortic valve: Secondary | ICD-10-CM

## 2021-05-12 DIAGNOSIS — I7121 Aneurysm of the ascending aorta, without rupture: Secondary | ICD-10-CM

## 2021-05-12 DIAGNOSIS — E785 Hyperlipidemia, unspecified: Secondary | ICD-10-CM

## 2021-05-12 DIAGNOSIS — I10 Essential (primary) hypertension: Secondary | ICD-10-CM | POA: Diagnosis not present

## 2021-05-12 NOTE — Progress Notes (Signed)
Cardiology Office Note   Date:  05/12/2021   ID:  Jonathan Burke, DOB 08-04-1960, MRN [id number]  PCP:  Ileana Ladd, MD  Cardiologist:   Lorine Bears, MD   No chief complaint on file.     History of Present Illness: Jonathan Burke is a 60 y.o. male who presents for a follow-up visit regarding mild coronary atherosclerosis, bicuspid aortic valve and ascending aortic aneurysm. He has known history of essential hypertension.  There is family history of sudden death involving his father and brother. He had cardiac evaluation in February,2020 with CTA of the coronary arteries.  Coronary calcium score was 74.  There was mild nonobstructive LAD disease that was not significant by FFR.  There was incidental finding of ascending aortic aneurysm measured 4.5 cm.   The notes indicate that he underwent genetic testing for aortopathy which was negative.  Echocardiogram showed an EF was 50 to 55%.  Mild aortic valve calcifications were noted. The patient was placed on amlodipine but he had edema and then switched to atenolol and lisinopril.  Atenolol was discontinued due to low blood pressure and orthostatic dizziness.  Lisinopril was switched to losartan due to dry cough. He is not a smoker.  He did not tolerate rosuvastatin or atorvastatin due to severe myalgia.  He is currently on pravastatin.    A transesophageal echocardiogram was done in June 2021 which showed an EF of 60 to 65%, bicuspid aortic valve with fusion of the right and left coronary cusps with mild aortic valve calcifications without significant stenosis.  The ascending aorta measured 48 mm.  The patient has been following with Dr. Laneta Simmers.  Most recent CTA showed stable ascending thoracic aortic aneurysm with maximum diameter of 4.5 cm.  He had some shortness of breath described during last visit and thus a Lexiscan Myoview was performed in July which showed no evidence of ischemia with normal ejection fraction.  He is doing  well with no chest pain, shortness of breath or palpitations.  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   Chest pain    Complication of anesthesia    states is hard to wake up   Dental crowns present    also caps   Dilated aortic root (HCC)    Enlarged prostate    GERD (gastroesophageal reflux disease)    daily OTC   H/O hiatal hernia    pt unsure if hernia or not, but has a knot in his stomach   Headache(784.0)    tension   PONV (postoperative nausea and vomiting)    Sleep apnea    pt non compliant with CPAP usage   Small bowel obstruction (HCC) 07/17/2012    Past Surgical History:  Procedure Laterality Date   APPENDECTOMY     CARPAL TUNNEL RELEASE  09/09/2011   Procedure: CARPAL TUNNEL RELEASE;  Surgeon: Wyn Forster., MD;  Location: [facility];  Service: Orthopedics;  Laterality: Left;   CHOLECYSTECTOMY     DIRECT LARYNGOSCOPY  07/07/2001   suspension microdirect laryngoscopy with exc. left vocal cord mass   ESOPHAGOGASTRODUODENOSCOPY N/A 06/05/2013   Procedure: ESOPHAGOGASTRODUODENOSCOPY (EGD);  Surgeon: Vertell Novak., MD;  Location: [facility];  Service: Endoscopy;  Laterality: N/A;   LAPAROSCOPIC LYSIS OF ADHESIONS N/A 05/29/2013   Procedure: DIAGNOSTIC LAPAROSCOPY CONVERTED TO EXPLORATORY LAPAROTOMY, LYSIS OF ADHESIONS ;  Surgeon: Shelly Rubenstein, MD;  Location: [facility] OR;  Service: General;  Laterality: N/A;   LUMBAR LAMINECTOMY/DECOMPRESSION MICRODISCECTOMY  10/29/1999   L5-S1   SHOULDER SURGERY     left   TEE WITHOUT CARDIOVERSION N/A 11/05/2019   Procedure: TRANSESOPHAGEAL ECHOCARDIOGRAM (TEE);  Surgeon: Iran Ouch, MD;  Location: [facility];  Service: Cardiovascular;  Laterality: N/A;     Current Outpatient Medications  Medication Sig Dispense Refill   Ascorbic Acid (VITAMIN C) 500 MG CAPS Take 1,000 mg by mouth at bedtime.      aspirin EC 81 MG tablet Take 81 mg by mouth  daily.     Cholecalciferol (VITAMIN D) 2000 UNITS CAPS Take 4,000 Units by mouth daily.     losartan (COZAAR) 25 MG tablet TAKE ONE (1) TABLET EACH DAY 30 tablet 0   pantoprazole (PROTONIX) 20 MG tablet TAKE ONE (1) TABLET BY MOUTH EVERY DAY 60 tablet 0   pravastatin (PRAVACHOL) 20 MG tablet Take 0.5 tablets (10 mg total) by mouth daily. 45 tablet 3   tadalafil (CIALIS) 5 MG tablet Take 5 mg by mouth daily.     vitamin B-12 (CYANOCOBALAMIN) 500 MCG tablet Take 1,000 mcg by mouth daily.      zinc gluconate 50 MG tablet Take 50 mg by mouth at bedtime.     No current facility-administered medications for this visit.    Allergies:   Adhesive [tape]    Social History:  The patient  reports that he has never smoked. He has never used smokeless tobacco. He reports that he does not drink alcohol and does not use drugs.   Family History:  The patient's family history includes Anuerysm in his sister; Arthritis in his sister and sister; COPD in his mother; Heart attack in his father; Heart disease in his mother; Hypertension in his brother and sister; Rectal cancer in his mother; Stroke in his sister.    ROS:  Please see the history of present illness.   Otherwise, review of systems are positive for none.   All other systems are reviewed and negative.    PHYSICAL EXAM: VS:  BP 124/78   Pulse 76   Ht 5\' 8"  (1.727 m)   Wt 235 lb 9.6 oz (106.9 kg)   SpO2 97%   BMI 35.82 kg/m  , BMI Body mass index is 35.82 kg/m. GEN: Well nourished, well developed, in no acute distress  HEENT: normal  Neck: no JVD, carotid bruits, or masses Cardiac: RRR; no murmurs, rubs, or gallops,no edema  Respiratory:  clear to auscultation bilaterally, normal work of breathing GI: soft, nontender, nondistended, + BS MS: no deformity or atrophy  Skin: warm and dry, no rash Neuro:  Strength and sensation are intact Psych: euthymic mood, full affect   EKG:  EKG is ordered today. EKG showed normal sinus rhythm with no  significant ST or T wave changes.   Recent Labs: No results found for requested labs within last 8760 hours.    Lipid Panel    Component Value Date/Time   CHOL 141 08/23/2019 1158   CHOL 155 09/05/2012 0925   TRIG 63 08/23/2019 1158   TRIG 68 09/05/2012 0925   HDL 39 (L) 08/23/2019 1158   HDL 43 09/05/2012 0925   CHOLHDL 3.6 08/23/2019 1158   LDLCALC 89 08/23/2019 1158   LDLCALC 98 09/05/2012 0925      Wt Readings from Last 3 Encounters:  05/12/21 235 lb 9.6 oz (106.9 kg)  12/05/20 238 lb (108 kg)  11/25/20 238 lb (108  kg)       No flowsheet data found.    ASSESSMENT AND PLAN:  1. Ascending aortic aneurysm: Most recent CTA showed stable size at 4.7 cm with plans to repeat CT in April of next year.  Continue to follow-up with Dr. Laneta Simmers.    Continue to avoid heavy lifting of more than 20 pounds and avoid fluoroquinolones antibiotics.    2. Essential hypertension: Blood pressures well controlled on losartan.  3. Hyperlipidemia: He did not tolerate atorvastatin or rosuvastatin in the past due to myalgia but he is tolerating pravastatin which will be continued.  Most recent lipid profile showed an LDL of 98.  4.  Bicuspid aortic valve: This was not associated with significant regurgitation or stenosis.    Disposition:   FU with me in 12 months  Signed,  Lorine Bears, MD  05/12/2021 9:17 AM    [facility]

## 2021-05-12 NOTE — Patient Instructions (Signed)

## 2021-05-18 ENCOUNTER — Other Ambulatory Visit: Payer: Self-pay | Admitting: Cardiovascular Disease

## 2021-06-01 DIAGNOSIS — H26493 Other secondary cataract, bilateral: Secondary | ICD-10-CM | POA: Diagnosis not present

## 2021-06-06 ENCOUNTER — Emergency Department (HOSPITAL_BASED_OUTPATIENT_CLINIC_OR_DEPARTMENT_OTHER): Payer: BC Managed Care – PPO

## 2021-06-06 ENCOUNTER — Inpatient Hospital Stay (HOSPITAL_BASED_OUTPATIENT_CLINIC_OR_DEPARTMENT_OTHER)
Admission: EM | Admit: 2021-06-06 | Discharge: 2021-06-09 | DRG: 389 | Disposition: A | Discharge status: Home / Self Care | Payer: BC Managed Care – PPO | Attending: Family Medicine | Admitting: Family Medicine

## 2021-06-06 ENCOUNTER — Other Ambulatory Visit (HOSPITAL_BASED_OUTPATIENT_CLINIC_OR_DEPARTMENT_OTHER): Payer: BC Managed Care – PPO

## 2021-06-06 ENCOUNTER — Other Ambulatory Visit: Payer: Self-pay

## 2021-06-06 ENCOUNTER — Inpatient Hospital Stay (HOSPITAL_COMMUNITY): Payer: BC Managed Care – PPO

## 2021-06-06 ENCOUNTER — Encounter (HOSPITAL_BASED_OUTPATIENT_CLINIC_OR_DEPARTMENT_OTHER): Payer: Self-pay | Admitting: Emergency Medicine

## 2021-06-06 DIAGNOSIS — R109 Unspecified abdominal pain: Secondary | ICD-10-CM | POA: Diagnosis not present

## 2021-06-06 DIAGNOSIS — Z800 Family history of malignant neoplasm of digestive organs: Secondary | ICD-10-CM | POA: Diagnosis not present

## 2021-06-06 DIAGNOSIS — N400 Enlarged prostate without lower urinary tract symptoms: Secondary | ICD-10-CM | POA: Diagnosis present

## 2021-06-06 DIAGNOSIS — K6389 Other specified diseases of intestine: Secondary | ICD-10-CM | POA: Diagnosis not present

## 2021-06-06 DIAGNOSIS — R0789 Other chest pain: Secondary | ICD-10-CM | POA: Diagnosis not present

## 2021-06-06 DIAGNOSIS — Z825 Family history of asthma and other chronic lower respiratory diseases: Secondary | ICD-10-CM

## 2021-06-06 DIAGNOSIS — K56609 Unspecified intestinal obstruction, unspecified as to partial versus complete obstruction: Secondary | ICD-10-CM | POA: Diagnosis not present

## 2021-06-06 DIAGNOSIS — Z20822 Contact with and (suspected) exposure to covid-19: Secondary | ICD-10-CM | POA: Diagnosis present

## 2021-06-06 DIAGNOSIS — Z7982 Long term (current) use of aspirin: Secondary | ICD-10-CM

## 2021-06-06 DIAGNOSIS — E785 Hyperlipidemia, unspecified: Secondary | ICD-10-CM | POA: Diagnosis not present

## 2021-06-06 DIAGNOSIS — I712 Thoracic aortic aneurysm, without rupture, unspecified: Secondary | ICD-10-CM | POA: Diagnosis present

## 2021-06-06 DIAGNOSIS — R112 Nausea with vomiting, unspecified: Secondary | ICD-10-CM | POA: Diagnosis not present

## 2021-06-06 DIAGNOSIS — Z9049 Acquired absence of other specified parts of digestive tract: Secondary | ICD-10-CM | POA: Diagnosis not present

## 2021-06-06 DIAGNOSIS — Z8249 Family history of ischemic heart disease and other diseases of the circulatory system: Secondary | ICD-10-CM | POA: Diagnosis not present

## 2021-06-06 DIAGNOSIS — Z823 Family history of stroke: Secondary | ICD-10-CM | POA: Diagnosis not present

## 2021-06-06 DIAGNOSIS — I10 Essential (primary) hypertension: Secondary | ICD-10-CM | POA: Diagnosis present

## 2021-06-06 DIAGNOSIS — K432 Incisional hernia without obstruction or gangrene: Secondary | ICD-10-CM | POA: Diagnosis not present

## 2021-06-06 DIAGNOSIS — Q231 Congenital insufficiency of aortic valve: Secondary | ICD-10-CM

## 2021-06-06 DIAGNOSIS — R1013 Epigastric pain: Secondary | ICD-10-CM | POA: Diagnosis not present

## 2021-06-06 DIAGNOSIS — K219 Gastro-esophageal reflux disease without esophagitis: Secondary | ICD-10-CM | POA: Diagnosis not present

## 2021-06-06 DIAGNOSIS — K760 Fatty (change of) liver, not elsewhere classified: Secondary | ICD-10-CM | POA: Diagnosis not present

## 2021-06-06 DIAGNOSIS — Q2381 Bicuspid aortic valve: Secondary | ICD-10-CM

## 2021-06-06 DIAGNOSIS — R079 Chest pain, unspecified: Secondary | ICD-10-CM

## 2021-06-06 DIAGNOSIS — K5651 Intestinal adhesions [bands], with partial obstruction: Secondary | ICD-10-CM | POA: Diagnosis not present

## 2021-06-06 DIAGNOSIS — Z79899 Other long term (current) drug therapy: Secondary | ICD-10-CM | POA: Diagnosis not present

## 2021-06-06 DIAGNOSIS — Z4682 Encounter for fitting and adjustment of non-vascular catheter: Secondary | ICD-10-CM | POA: Diagnosis not present

## 2021-06-06 DIAGNOSIS — K5669 Other partial intestinal obstruction: Secondary | ICD-10-CM | POA: Diagnosis not present

## 2021-06-06 DIAGNOSIS — I7121 Aneurysm of the ascending aorta, without rupture: Secondary | ICD-10-CM | POA: Diagnosis not present

## 2021-06-06 DIAGNOSIS — Z8261 Family history of arthritis: Secondary | ICD-10-CM | POA: Diagnosis not present

## 2021-06-06 DIAGNOSIS — Z0189 Encounter for other specified special examinations: Secondary | ICD-10-CM

## 2021-06-06 HISTORY — DX: Aortic aneurysm of unspecified site, without rupture: I71.9

## 2021-06-06 LAB — CBC WITH DIFFERENTIAL/PLATELET
Abs Immature Granulocytes: 0.02 10*3/uL (ref 0.00–0.07)
Basophils Absolute: 0 10*3/uL (ref 0.0–0.1)
Basophils Relative: 0 %
Eosinophils Absolute: 0.1 10*3/uL (ref 0.0–0.5)
Eosinophils Relative: 1 %
HCT: 46.8 % (ref 39.0–52.0)
Hemoglobin: 15.8 g/dL (ref 13.0–17.0)
Immature Granulocytes: 0 %
Lymphocytes Relative: 12 %
Lymphs Abs: 1.3 10*3/uL (ref 0.7–4.0)
MCH: 30.8 pg (ref 26.0–34.0)
MCHC: 33.8 g/dL (ref 30.0–36.0)
MCV: 91.2 fL (ref 80.0–100.0)
Monocytes Absolute: 0.9 10*3/uL (ref 0.1–1.0)
Monocytes Relative: 9 %
Neutro Abs: 8.3 10*3/uL — ABNORMAL HIGH (ref 1.7–7.7)
Neutrophils Relative %: 78 %
Platelets: 316 10*3/uL (ref 150–400)
RBC: 5.13 MIL/uL (ref 4.22–5.81)
RDW: 12.8 % (ref 11.5–15.5)
WBC: 10.6 10*3/uL — ABNORMAL HIGH (ref 4.0–10.5)
nRBC: 0 % (ref 0.0–0.2)

## 2021-06-06 LAB — COMPREHENSIVE METABOLIC PANEL
ALT: 41 U/L (ref 0–44)
AST: 27 U/L (ref 15–41)
Albumin: 4.7 g/dL (ref 3.5–5.0)
Alkaline Phosphatase: 46 U/L (ref 38–126)
Anion gap: 9 (ref 5–15)
BUN: 15 mg/dL (ref 6–20)
CO2: 26 mmol/L (ref 22–32)
Calcium: 9.8 mg/dL (ref 8.9–10.3)
Chloride: 103 mmol/L (ref 98–111)
Creatinine, Ser: 0.82 mg/dL (ref 0.61–1.24)
GFR, Estimated: >60 mL/min (ref >60)
Glucose, Bld: 114 mg/dL — ABNORMAL HIGH (ref 70–99)
Potassium: 3.9 mmol/L (ref 3.5–5.1)
Sodium: 138 mmol/L (ref 135–145)
Total Bilirubin: 0.7 mg/dL (ref 0.3–1.2)
Total Protein: 8.6 g/dL — ABNORMAL HIGH (ref 6.5–8.1)

## 2021-06-06 LAB — LIPASE, BLOOD: Lipase: 21 U/L (ref 11–51)

## 2021-06-06 LAB — TROPONIN I (HIGH SENSITIVITY)
Troponin I (High Sensitivity): <2 ng/L (ref <18)
Troponin I (High Sensitivity): <2 ng/L (ref <18)

## 2021-06-06 LAB — RESP PANEL BY RT-PCR (FLU A&B, COVID) ARPGX2
Influenza A by PCR: NEGATIVE
Influenza B by PCR: NEGATIVE
SARS Coronavirus 2 by RT PCR: NEGATIVE

## 2021-06-06 MED ORDER — MORPHINE SULFATE (PF) 4 MG/ML IV SOLN
4.0000 mg | Freq: Once | INTRAVENOUS | Status: AC
  Administered 2021-06-06: 4 mg via INTRAVENOUS
  Filled 2021-06-06: qty 1

## 2021-06-06 MED ORDER — IOHEXOL 350 MG/ML SOLN
100.0000 mL | Freq: Once | INTRAVENOUS | Status: AC | PRN
  Administered 2021-06-06: 100 mL via INTRAVENOUS

## 2021-06-06 MED ORDER — ACETAMINOPHEN 325 MG PO TABS: 650.0000 mg | ORAL_TABLET | Freq: Four times a day (QID) | ORAL | Status: DC | PRN

## 2021-06-06 MED ORDER — IOHEXOL 350 MG/ML SOLN: 100.0000 mL | Freq: Once | INTRAVENOUS | Status: DC | PRN

## 2021-06-06 MED ORDER — MORPHINE SULFATE (PF) 2 MG/ML IV SOLN
1.0000 mg | INTRAVENOUS | Status: DC | PRN
  Administered 2021-06-06: 1 mg via INTRAVENOUS
  Filled 2021-06-06: qty 1

## 2021-06-06 MED ORDER — ONDANSETRON HCL 4 MG/2ML IJ SOLN
4.0000 mg | Freq: Once | INTRAMUSCULAR | Status: AC
  Administered 2021-06-06: 4 mg via INTRAVENOUS
  Filled 2021-06-06: qty 2

## 2021-06-06 MED ORDER — LIDOCAINE HCL URETHRAL/MUCOSAL 2 % EX GEL
1.0000 "application " | Freq: Once | CUTANEOUS | Status: AC
  Administered 2021-06-06: 1 via TOPICAL
  Filled 2021-06-06: qty 11

## 2021-06-06 MED ORDER — DIATRIZOATE MEGLUMINE & SODIUM 66-10 % PO SOLN
90.0000 mL | Freq: Once | ORAL | Status: DC
  Filled 2021-06-06: qty 90

## 2021-06-06 MED ORDER — ONDANSETRON HCL 4 MG/2ML IJ SOLN
4.0000 mg | Freq: Four times a day (QID) | INTRAMUSCULAR | Status: DC | PRN
  Administered 2021-06-06 – 2021-06-08 (×4): 4 mg via INTRAVENOUS
  Filled 2021-06-06 (×4): qty 2

## 2021-06-06 MED ORDER — SODIUM CHLORIDE 0.9 % IV SOLN: INTRAVENOUS | Status: AC

## 2021-06-06 MED ORDER — ACETAMINOPHEN 650 MG RE SUPP: 650.0000 mg | Freq: Four times a day (QID) | RECTAL | Status: DC | PRN

## 2021-06-06 NOTE — ED Notes (Signed)
Report called to [facility]. Niel Hummer, RN [place] is receiving nurse and updated her as well.

## 2021-06-06 NOTE — Progress Notes (Signed)
Notified by EDP of need for admission d/t SBO. [facility] accepts patient to tele bed at [facility]. EDP is to remain responsible for orders/medical decisions while patient is holding at [facility] Upon arrival to [facility], [facility] will assume care. Nursing staff will call patient placement to notify them of patient's arrival so that the proper [facility] member may receive the patient. Nursing staff will notify the following consultants, General Surgery, of patient's arrival for their evaluation. Thank you.

## 2021-06-06 NOTE — ED Triage Notes (Signed)
Pt c/o chest pain, midsternal all night. Pt states had to sleep in recliner. Pt does have hx acid reflux and abdominal blockage so thought it might pass. Pt also has an aortic aneurysm being monitored by Dr. Laneta Simmers

## 2021-06-06 NOTE — H&P (Signed)
History and Physical    Jonathan Runnererry W Siers ZOX:[id number] DOB: 07/22/1960 DOA: 06/06/2021  PCP: Ileana LaddWong, Francis P, MD [facility]  Chief Complaint: Chest pain  HPI: Jonathan Burke is a 61 y.o. male with medical history significant of ascending aortic aneurysm followed by Dr. Laneta SimmersBartle, hypertension, hyperlipidemia, bicuspid aortic valve, BPH, GERD, OSA, history of cholecystectomy and appendectomy presents to the ED complaining of chest/abdominal pain associated with nausea and vomiting.  Vital signs stable.  Labs showing no significant leukocytosis.  Hemoglobin normal.  Lipase and LFTs normal.  High-sensitivity troponin negative x2.  CT dissection study negative.  Showing stable ascending thoracic aortic aneurysm measuring up to 4.6 cm in diameter.  CT abdomen pelvis showing small bowel obstruction extending from the mid jejunum into the mid to distal ileum without a single discrete transition point. Patient was given morphine and Zofran.  General surgery consulted and NG tube placed.  Patient states his symptoms started around midnight yesterday.  He started having severe generalized abdominal pain.  He was having bowel movements but it was just watery diarrhea.  He was also having lower chest pain which improved after he vomited in the emergency room.  He reports history of multiple prior abdominal surgeries and multiple prior bowel obstructions.  No other complaints.  Review of Systems:  All systems reviewed and apart from history of presenting illness, are negative.  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   Chest pain    Complication of anesthesia    states is hard to wake up   Dental crowns present    also caps   Dilated aortic root (HCC)    Enlarged prostate    GERD (gastroesophageal reflux disease)    daily OTC   H/O hiatal hernia    pt unsure if hernia or not, but has a knot in his  stomach   Headache(784.0)    tension   PONV (postoperative nausea and vomiting)    Sleep apnea    pt non compliant with CPAP usage   Small bowel obstruction (HCC) 07/17/2012    Past Surgical History:  Procedure Laterality Date   APPENDECTOMY     CARPAL TUNNEL RELEASE  09/09/2011   Procedure: CARPAL TUNNEL RELEASE;  Surgeon: Wyn Forsterobert V Sypher Jr., MD;  Location: [facility];  Service: Orthopedics;  Laterality: Left;   CHOLECYSTECTOMY     DIRECT LARYNGOSCOPY  07/07/2001   suspension microdirect laryngoscopy with exc. left vocal cord mass   ESOPHAGOGASTRODUODENOSCOPY N/A 06/05/2013   Procedure: ESOPHAGOGASTRODUODENOSCOPY (EGD);  Surgeon: Vertell NovakJames L Edwards Jr., MD;  [facility];  Service: Endoscopy;  Laterality: N/A;   LAPAROSCOPIC LYSIS OF ADHESIONS N/A 05/29/2013   Procedure: DIAGNOSTIC LAPAROSCOPY CONVERTED TO EXPLORATORY LAPAROTOMY, LYSIS OF ADHESIONS ;  Surgeon: Shelly Rubensteinouglas A Blackman, MD;  Location: [facility] OR;  Service: General;  Laterality: N/A;   LUMBAR LAMINECTOMY/DECOMPRESSION MICRODISCECTOMY  10/29/1999   L5-S1   SHOULDER SURGERY     left   TEE WITHOUT CARDIOVERSION N/A 11/05/2019   Procedure: TRANSESOPHAGEAL ECHOCARDIOGRAM (TEE);  Surgeon: Iran OuchArida, Muhammad A, MD;  Location: [facility] ORS;  Service: Cardiovascular;  Laterality: N/A;     reports that he has never smoked. He has never used smokeless tobacco. He reports that he does not drink alcohol and does not use drugs.  Allergies  Allergen Reactions   Adhesive [Tape] Other (See Comments)  PULLS SKIN OFF. Paper tape only   Atorvastatin Other (See Comments)    Made the patient's bones hurt    Family History  Problem Relation Age of Onset   COPD Mother    Heart disease Mother    Rectal cancer Mother    Heart attack Father    Arthritis Sister    Hypertension Brother    Hypertension Sister    Anuerysm Sister    Stroke Sister    Arthritis Sister        RA    Prior to Admission medications   Medication Sig  Start Date End Date Taking? Authorizing Provider  Ascorbic Acid (VITAMIN C) 500 MG CAPS Take 1,000 mg by mouth at bedtime.     [provider]  aspirin EC 81 MG tablet Take 81 mg by mouth daily.    [provider]  Cholecalciferol (VITAMIN D) 2000 UNITS CAPS Take 4,000 Units by mouth daily.    [provider]  losartan (COZAAR) 25 MG tablet TAKE ONE (1) TABLET EACH DAY 05/18/21   Iran Ouch, MD  pantoprazole (PROTONIX) 20 MG tablet TAKE ONE (1) TABLET BY MOUTH EVERY DAY 04/27/21   Iran Ouch, MD  pravastatin (PRAVACHOL) 20 MG tablet Take 0.5 tablets (10 mg total) by mouth daily. 12/11/20   Iran Ouch, MD  tadalafil (CIALIS) 5 MG tablet Take 5 mg by mouth daily. 03/09/21   [provider]  vitamin B-12 (CYANOCOBALAMIN) 500 MCG tablet Take 1,000 mcg by mouth daily.     [provider]  zinc gluconate 50 MG tablet Take 50 mg by mouth at bedtime.    [provider]    Physical Exam: Vitals:   06/06/21 1215 06/06/21 1347 06/06/21 1711 06/06/21 1924  BP: 131/81 126/86 119/82 128/86  Pulse: 85 75 82 75  Resp: 16 16 16 20   Temp:   98 F (36.7 C) 97.8 F (36.6 C)  TempSrc:    Oral  SpO2: 95% 93% 92% 96%    Physical Exam Constitutional:      General: He is not in acute distress. HENT:     Head: Normocephalic and atraumatic.     Nose:     Comments: NG tube in place Eyes:     Extraocular Movements: Extraocular movements intact.     Conjunctiva/sclera: Conjunctivae normal.  Cardiovascular:     Rate and Rhythm: Normal rate and regular rhythm.     Pulses: Normal pulses.  Pulmonary:     Effort: Pulmonary effort is normal. No respiratory distress.     Breath sounds: Normal breath sounds. No wheezing or rales.  Abdominal:     General: There is distension.     Palpations: Abdomen is soft.     Tenderness: There is abdominal tenderness. There is no guarding.     Comments: Abdomen significantly distended with generalized  tenderness to palpation.  Musculoskeletal:        General: No swelling or tenderness.     Cervical back: Normal range of motion and neck supple.  Skin:    General: Skin is warm and dry.  Neurological:     General: No focal deficit present.     Mental Status: He is alert and oriented to person, place, and time.     Labs on Admission: I have personally reviewed following labs and imaging studies  CBC: Recent Labs  Lab 06/06/21 1152  WBC 10.6*  NEUTROABS 8.3*  HGB 15.8  HCT 46.8  MCV  91.2  PLT 316   Basic Metabolic Panel: Recent Labs  Lab 06/06/21 1152  NA 138  K 3.9  CL 103  CO2 26  GLUCOSE 114*  BUN 15  CREATININE 0.82  CALCIUM 9.8   GFR: CrCl cannot be calculated (Unknown ideal weight.). Liver Function Tests: Recent Labs  Lab 06/06/21 1152  AST 27  ALT 41  ALKPHOS 46  BILITOT 0.7  PROT 8.6*  ALBUMIN 4.7   Recent Labs  Lab 06/06/21 1152  LIPASE 21   No results for input(s): AMMONIA in the last 168 hours. Coagulation Profile: No results for input(s): INR, PROTIME in the last 168 hours. Cardiac Enzymes: No results for input(s): CKTOTAL, CKMB, CKMBINDEX, TROPONINI in the last 168 hours. BNP (last 3 results) No results for input(s): PROBNP in the last 8760 hours. HbA1C: No results for input(s): HGBA1C in the last 72 hours. CBG: No results for input(s): GLUCAP in the last 168 hours. Lipid Profile: No results for input(s): CHOL, HDL, LDLCALC, TRIG, CHOLHDL, LDLDIRECT in the last 72 hours. Thyroid Function Tests: No results for input(s): TSH, T4TOTAL, FREET4, T3FREE, THYROIDAB in the last 72 hours. Anemia Panel: No results for input(s): VITAMINB12, FOLATE, FERRITIN, TIBC, IRON, RETICCTPCT in the last 72 hours. Urine analysis:    Component Value Date/Time   COLORURINE YELLOW 06/15/2019 1818   APPEARANCEUR HAZY (A) 06/15/2019 1818   LABSPEC >1.030 (H) 06/15/2019 1818   PHURINE 6.0 06/15/2019 1818   GLUCOSEU NEGATIVE 06/15/2019 1818   HGBUR  NEGATIVE 06/15/2019 1818   BILIRUBINUR NEGATIVE 06/15/2019 1818   BILIRUBINUR NEG 04/16/2014 0905   KETONESUR 15 (A) 06/15/2019 1818   PROTEINUR NEGATIVE 06/15/2019 1818   UROBILINOGEN negative 04/16/2014 0905   UROBILINOGEN 1.0 07/17/2012 1933   NITRITE NEGATIVE 06/15/2019 1818   LEUKOCYTESUR NEGATIVE 06/15/2019 1818    Radiological Exams on Admission: DG Abd 1 View  Result Date: 06/06/2021 CLINICAL DATA:  Small bowel obstruction. Baseline small bowel study. EXAM: ABDOMEN - 1 VIEW COMPARISON:  CT AP 06/06/2021 FINDINGS: 100 cc of Omnipaque 350 were administered to the patient through indwelling nasogastric tube. This shows contrast within the body of the stomach. Dilated small bowel loops are again noted within the central abdomen. IMPRESSION: Baseline study after injection of water-soluble contrast material into the gastric lumen to assess bowel obstruction. Repeat examination will be performed in 8 hours to assess contrast transit through the small bowel. Electronically Signed   By: Signa Kellaylor  Stroud M.D.   On: 06/06/2021 16:41   DG Abdomen 1 View  Result Date: 06/06/2021 CLINICAL DATA:  Chest pain, abdominal distention EXAM: ABDOMEN - 1 VIEW COMPARISON:  07/09/2016 FINDINGS: Gas distended loops of proximal to mid small bowel in the left and central abdomen, largest loops measuring up to 4.7 cm in caliber. No obvious free air on supine radiograph. No radio-opaque calculi or other significant radiographic abnormality are seen. IMPRESSION: Gas distended loops of proximal to mid small bowel in the left and central abdomen, largest loops measuring up to 4.7 cm in caliber. Findings may reflect ileus or obstruction. Consider CT to further evaluate. Electronically Signed   By: Jearld LeschAlex D Bibbey M.D.   On: 06/06/2021 12:23   CT ABDOMEN PELVIS W CONTRAST  Result Date: 06/06/2021 CLINICAL DATA:  Abdominal pain. EXAM: CT ABDOMEN AND PELVIS WITH CONTRAST TECHNIQUE: Multidetector CT imaging of the abdomen and  pelvis was performed using the standard protocol following bolus administration of intravenous contrast. CONTRAST:  100mL OMNIPAQUE IOHEXOL 350 MG/ML SOLN COMPARISON:  June 15, 2019 FINDINGS: Lower chest: The visualized portion of the ascending thoracic aorta measures up to 4.8 cm. See the CT pulmonary angiogram for better evaluation of the thoracic aorta. Calcified atherosclerosis seen in the left coronary arteries. The heart is unremarkable. No other abnormality seen in the lower chest. Hepatobiliary: Patient is status post cholecystectomy. Hepatic steatosis is identified. No focal liver lesions are noted. The portal vein is well opacified with no obstruction identified. Pancreas: Unremarkable. No pancreatic ductal dilatation or surrounding inflammatory changes. Spleen: Normal in size without focal abnormality. Adrenals/Urinary Tract: Adrenal glands are unremarkable. Kidneys are normal, without renal calculi, focal lesion, or hydronephrosis. Bladder is unremarkable. Stomach/Bowel: The stomach is mildly distended. There is small bowel dilatation beginning in the mid jejunum and extending into at least the mid ileum. The most distal aspect of the ileum is decompressed. A discrete transition point is not identified. The colon is decompressed. The appendix is been removed. Vascular/Lymphatic: The abdominal aorta is nonaneurysmal. No adenopathy. Reproductive: Prostate is unremarkable. Other: There is a ventral hernia seen on series 2, image 55 containing omentum. There is another ventral hernia seen on series 2, image 41 containing omentum. No free air free fluid. Musculoskeletal: No acute or significant osseous findings. IMPRESSION: 1. Small-bowel obstruction extending from the mid jejunum into the mid to distal ileum without a single discrete transition point. The distal ileum and colon are decompressed. 2. The ascending thoracic aorta is aneurysmal measuring up to 4.8 cm. Please see the CT pulmonary angiogram  report from today for further evaluation. 3. Coronary artery disease. 4. Previous cholecystectomy. 5. Hepatic steatosis. 6. 2 ventral hernias containing omentum. Electronically Signed   By: Gerome Sam III M.D.   On: 06/06/2021 13:03   DG Chest Portable 1 View  Result Date: 06/06/2021 CLINICAL DATA:  Chest pain and upper abdominal pain. EXAM: PORTABLE CHEST 1 VIEW COMPARISON:  04/03/2020. FINDINGS: Cardiac silhouette is normal in size. No mediastinal or hilar masses. Clear lungs.  No pleural effusion or pneumothorax. Skeletal structures are grossly intact. IMPRESSION: No active disease. Electronically Signed   By: Amie Portland M.D.   On: 06/06/2021 12:23   DG Abd Portable 1V  Result Date: 06/06/2021 CLINICAL DATA:  Small bowel obstruction. Evaluate transit of enteric contrast material. This is the 1 hour post administration of enteric contrast material. EXAM: PORTABLE ABDOMEN - 1 VIEW COMPARISON:  11/04/2021 at 4:14 p.m. FINDINGS: NG tube tip remains within the stomach. Enteric contrast material is again noted within the gastric body. No significant small bowel contrast identified at this time. Persistent dilated loops of small bowel appear unchanged from the previous exam. IMPRESSION: 1. Stasis of enteric contrast material noted within the gastric lumen. No significant small bowel contrast identified at this time. A repeat radiograph of the abdomen will be obtained in 8 hours to assess transit of the enteric contrast material. Electronically Signed   By: Signa Kell M.D.   On: 06/06/2021 17:22   DG Abd Portable 1V-Small Bowel Protocol-Position Verification  Result Date: 06/06/2021 CLINICAL DATA:  NG tube placement verification. EXAM: PORTABLE ABDOMEN - 1 VIEW COMPARISON:  CT examination performed earlier on the same date FINDINGS: Dilated small bowel loop measuring up to 4.3 cm concerning for bowel obstruction/ileus. NG tube with side port just below the GE junction and tip of the tube overlying  the body of the stomach. Lung bases are clear. IMPRESSION: NG tube in satisfactory position. Dilated small bowel loops measuring up to 4.3 cm concerning for ileus/obstruction.  Electronically Signed   By: Larose Hires D.O.   On: 06/06/2021 15:55   CT Angio Chest Aorta W and/or Wo Contrast  Result Date: 06/06/2021 CLINICAL DATA:  Chest pain.  Midsternal. EXAM: CT ANGIOGRAPHY CHEST WITH CONTRAST TECHNIQUE: Multidetector CT imaging of the chest was performed using the standard protocol during bolus administration of intravenous contrast. Multiplanar CT image reconstructions and MIPs were obtained to evaluate the vascular anatomy. CONTRAST:  OMNIPAQUE IOHEXOL 350 MG/ML SOLN COMPARISON:  09/10/2020 FINDINGS: Cardiovascular: Pre contrast imaging performed as part of this study shows no hyperdense crescent in the wall of the thoracic aorta to suggest the presence of an acute intramural hematoma. The heart size is normal. No substantial pericardial effusion. Coronary artery calcification is evident. Ascending thoracic aorta measures 4.6 cm diameter, no substantial change from 4.5 cm previously. No evidence for thoracic aortic dissection. No large central pulmonary embolus in the pulmonary outflow tract or main pulmonary arteries. No evidence for segmental pulmonary embolus. Mediastinum/Nodes: No mediastinal lymphadenopathy. There is no hilar lymphadenopathy. The esophagus has normal imaging features. There is no axillary lymphadenopathy. Lungs/Pleura: No suspicious pulmonary nodule or mass. No focal airspace consolidation. No pleural effusion. Upper Abdomen: See report for abdomen/pelvis CT performed at the same time. Musculoskeletal: No worrisome lytic or sclerotic osseous abnormality. Review of the MIP images confirms the above findings. IMPRESSION: 1. No CT evidence for acute intramural hematoma or dissection of the thoracic aorta. 2. Stable ascending thoracic aortic aneurysm measuring up to 4.6 cm diameter.  Ascending thoracic aortic aneurysm. Recommend semi-annual imaging followup by CTA or MRA and referral to cardiothoracic surgery if not already obtained. This recommendation follows 2010 ACCF/AHA/AATS/ACR/ASA/SCA/SCAI/SIR/STS/SVM Guidelines for the Diagnosis and Management of Patients With Thoracic Aortic Disease. Circulation. 2010; 121: Z610-R604. Aortic aneurysm NOS (ICD10-I71.9) 3. Coronary artery atherosclerosis. Electronically Signed   By: Kennith Center M.D.   On: 06/06/2021 12:57    EKG: Pending at this time.  Assessment/Plan Principal Problem:   SBO (small bowel obstruction) (HCC) Active Problems:   BPH (benign prostatic hyperplasia)   Aneurysm, ascending aorta   Bicuspid aortic valve   Chest pain   SBO Patient here for evaluation of abdominal pain, nausea, and vomiting.  History of multiple prior abdominal surgeries and also reports history of prior bowel obstructions.  CT abdomen pelvis showing small bowel obstruction extending from the mid jejunum into the mid to distal ileum without a single discrete transition point. -General surgery consulted.  Keep n.p.o., bowel rest.  NG tube in place.  IV fluid hydration, pain management, antiemetic as needed.  Monitor electrolytes.  Serial abdominal exams.  Chest pain ACS less likely as high-sensitivity troponin negative x2.  Stress test done in July 2022 was low risk.  CT dissection study negative.  Currently chest pain-free. -Cardiac monitoring  Ascending thoracic aortic aneurysm Stable on CT done today.  Patient is followed by Dr. Laneta Simmers. -Outpatient cardiothoracic surgery follow-up  Bicuspid aortic valve -Followed by cardiology and cardiothoracic surgery, outpatient follow-up  Hypertension: Stable. Hyperlipidemia BPH GERD -Pharmacy med rec pending.  DVT prophylaxis: SCDs Code Status: Patient wishes to be full code. Family Communication: Wife and daughter at bedside. Disposition Plan: Status is: Inpatient  Remains inpatient  appropriate because: SBO requiring NG tube  Level of care: Level of care: Telemetry  The medical decision making on this patient was of high complexity and the patient is at high risk for clinical deterioration, therefore this is a level 3 visit.  John Giovanni MD [facility]  If 7PM-7AM, please contact night-coverage [email or web address]  06/06/2021, 8:07 PM

## 2021-06-06 NOTE — ED Notes (Signed)
Patient transported to CT 

## 2021-06-06 NOTE — ED Provider Notes (Signed)
[facility] EMERGENCY DEPT Provider Note   CSN: [id number] Arrival date & time: 06/06/21  1125     History  Chief Complaint  Patient presents with   Chest Pain    Jonathan Burke is a 61 y.o. male.  Patient is  61 yo male with PMH of hypertension, hyperlipidemia, obesity, and known aortic aneurysm presenting for abdominal pain. Patient admits to epigastric abdominal pain radiating to the chest associated with nausea and vomiting described as twisting and severe. Onset 2 hours ago.   The history is provided by the patient. No language interpreter was used.  Chest Pain Associated symptoms: abdominal pain, nausea and vomiting   Associated symptoms: no back pain, no cough, no fever, no palpitations and no shortness of breath       Home Medications Prior to Admission medications   Medication Sig Start Date End Date Taking? Authorizing Provider  Ascorbic Acid (VITAMIN C) 500 MG CAPS Take 1,000 mg by mouth at bedtime.     [provider]  aspirin EC 81 MG tablet Take 81 mg by mouth daily.    [provider]  Cholecalciferol (VITAMIN D) 2000 UNITS CAPS Take 4,000 Units by mouth daily.    [provider]  losartan (COZAAR) 25 MG tablet TAKE ONE (1) TABLET EACH DAY 05/18/21   Wellington Hampshire, MD  pantoprazole (PROTONIX) 20 MG tablet TAKE ONE (1) TABLET BY MOUTH EVERY DAY 04/27/21   Wellington Hampshire, MD  pravastatin (PRAVACHOL) 20 MG tablet Take 0.5 tablets (10 mg total) by mouth daily. 12/11/20   Wellington Hampshire, MD  tadalafil (CIALIS) 5 MG tablet Take 5 mg by mouth daily. 03/09/21   [provider]  vitamin B-12 (CYANOCOBALAMIN) 500 MCG tablet Take 1,000 mcg by mouth daily.     [provider]  zinc gluconate 50 MG tablet Take 50 mg by mouth at bedtime.    [provider]      Allergies    Adhesive [tape]    Review of Systems   Review of Systems  Constitutional:  Negative for chills and fever.  HENT:  Negative  for ear pain and sore throat.   Eyes:  Negative for pain and visual disturbance.  Respiratory:  Negative for cough and shortness of breath.   Cardiovascular:  Positive for chest pain. Negative for palpitations.  Gastrointestinal:  Positive for abdominal pain, nausea and vomiting.  Genitourinary:  Negative for dysuria and hematuria.  Musculoskeletal:  Negative for arthralgias and back pain.  Skin:  Negative for color change and rash.  Neurological:  Negative for seizures and syncope.  All other systems reviewed and are negative.  Physical Exam Updated Vital Signs BP 126/86    Pulse 75    Temp 98 F (36.7 C) (Oral)    Resp 16    SpO2 93%  Physical Exam Vitals and nursing note reviewed.  Constitutional:      General: He is not in acute distress.    Appearance: He is well-developed.  HENT:     Head: Normocephalic and atraumatic.  Eyes:     Conjunctiva/sclera: Conjunctivae normal.  Cardiovascular:     Rate and Rhythm: Normal rate and regular rhythm.     Heart sounds: No murmur heard. Pulmonary:     Effort: Pulmonary effort is normal. No respiratory distress.     Breath sounds: Normal breath sounds.  Abdominal:     General: Abdomen is flat and protuberant. There is distension.  Palpations: Abdomen is soft.     Tenderness: There is no abdominal tenderness.     Comments: Rigid abdomen Actively vomiting  Musculoskeletal:        General: No swelling.     Cervical back: Neck supple.  Skin:    General: Skin is warm and dry.     Capillary Refill: Capillary refill takes less than 2 seconds.  Neurological:     Mental Status: He is alert.  Psychiatric:        Mood and Affect: Mood normal.    ED Results / Procedures / Treatments   Labs (all labs ordered are listed, but only abnormal results are displayed) Labs Reviewed  CBC WITH DIFFERENTIAL/PLATELET - Abnormal; Notable for the following components:      Result Value   WBC 10.6 (*)    Neutro Abs 8.3 (*)    All other  components within normal limits  COMPREHENSIVE METABOLIC PANEL - Abnormal; Notable for the following components:   Glucose, Bld 114 (*)    Total Protein 8.6 (*)    All other components within normal limits  LIPASE, BLOOD  TROPONIN I (HIGH SENSITIVITY)  TROPONIN I (HIGH SENSITIVITY)    EKG None  Radiology DG Abdomen 1 View  Result Date: 06/06/2021 CLINICAL DATA:  Chest pain, abdominal distention EXAM: ABDOMEN - 1 VIEW COMPARISON:  07/09/2016 FINDINGS: Gas distended loops of proximal to mid small bowel in the left and central abdomen, largest loops measuring up to 4.7 cm in caliber. No obvious free air on supine radiograph. No radio-opaque calculi or other significant radiographic abnormality are seen. IMPRESSION: Gas distended loops of proximal to mid small bowel in the left and central abdomen, largest loops measuring up to 4.7 cm in caliber. Findings may reflect ileus or obstruction. Consider CT to further evaluate. Electronically Signed   By: Delanna Ahmadi M.D.   On: 06/06/2021 12:23   CT ABDOMEN PELVIS W CONTRAST  Result Date: 06/06/2021 CLINICAL DATA:  Abdominal pain. EXAM: CT ABDOMEN AND PELVIS WITH CONTRAST TECHNIQUE: Multidetector CT imaging of the abdomen and pelvis was performed using the standard protocol following bolus administration of intravenous contrast. CONTRAST:  149mL OMNIPAQUE IOHEXOL 350 MG/ML SOLN COMPARISON:  June 15, 2019 FINDINGS: Lower chest: The visualized portion of the ascending thoracic aorta measures up to 4.8 cm. See the CT pulmonary angiogram for better evaluation of the thoracic aorta. Calcified atherosclerosis seen in the left coronary arteries. The heart is unremarkable. No other abnormality seen in the lower chest. Hepatobiliary: Patient is status post cholecystectomy. Hepatic steatosis is identified. No focal liver lesions are noted. The portal vein is well opacified with no obstruction identified. Pancreas: Unremarkable. No pancreatic ductal dilatation or  surrounding inflammatory changes. Spleen: Normal in size without focal abnormality. Adrenals/Urinary Tract: Adrenal glands are unremarkable. Kidneys are normal, without renal calculi, focal lesion, or hydronephrosis. Bladder is unremarkable. Stomach/Bowel: The stomach is mildly distended. There is small bowel dilatation beginning in the mid jejunum and extending into at least the mid ileum. The most distal aspect of the ileum is decompressed. A discrete transition point is not identified. The colon is decompressed. The appendix is been removed. Vascular/Lymphatic: The abdominal aorta is nonaneurysmal. No adenopathy. Reproductive: Prostate is unremarkable. Other: There is a ventral hernia seen on series 2, image 55 containing omentum. There is another ventral hernia seen on series 2, image 41 containing omentum. No free air free fluid. Musculoskeletal: No acute or significant osseous findings. IMPRESSION: 1. Small-bowel obstruction extending from the  mid jejunum into the mid to distal ileum without a single discrete transition point. The distal ileum and colon are decompressed. 2. The ascending thoracic aorta is aneurysmal measuring up to 4.8 cm. Please see the CT pulmonary angiogram report from today for further evaluation. 3. Coronary artery disease. 4. Previous cholecystectomy. 5. Hepatic steatosis. 6. 2 ventral hernias containing omentum. Electronically Signed   By: Dorise Bullion III M.D.   On: 06/06/2021 13:03   DG Chest Portable 1 View  Result Date: 06/06/2021 CLINICAL DATA:  Chest pain and upper abdominal pain. EXAM: PORTABLE CHEST 1 VIEW COMPARISON:  04/03/2020. FINDINGS: Cardiac silhouette is normal in size. No mediastinal or hilar masses. Clear lungs.  No pleural effusion or pneumothorax. Skeletal structures are grossly intact. IMPRESSION: No active disease. Electronically Signed   By: Lajean Manes M.D.   On: 06/06/2021 12:23   CT Angio Chest Aorta W and/or Wo Contrast  Result Date:  06/06/2021 CLINICAL DATA:  Chest pain.  Midsternal. EXAM: CT ANGIOGRAPHY CHEST WITH CONTRAST TECHNIQUE: Multidetector CT imaging of the chest was performed using the standard protocol during bolus administration of intravenous contrast. Multiplanar CT image reconstructions and MIPs were obtained to evaluate the vascular anatomy. CONTRAST:  120mL OMNIPAQUE IOHEXOL 350 MG/ML SOLN COMPARISON:  09/10/2020 FINDINGS: Cardiovascular: Pre contrast imaging performed as part of this study shows no hyperdense crescent in the wall of the thoracic aorta to suggest the presence of an acute intramural hematoma. The heart size is normal. No substantial pericardial effusion. Coronary artery calcification is evident. Ascending thoracic aorta measures 4.6 cm diameter, no substantial change from 4.5 cm previously. No evidence for thoracic aortic dissection. No large central pulmonary embolus in the pulmonary outflow tract or main pulmonary arteries. No evidence for segmental pulmonary embolus. Mediastinum/Nodes: No mediastinal lymphadenopathy. There is no hilar lymphadenopathy. The esophagus has normal imaging features. There is no axillary lymphadenopathy. Lungs/Pleura: No suspicious pulmonary nodule or mass. No focal airspace consolidation. No pleural effusion. Upper Abdomen: See report for abdomen/pelvis CT performed at the same time. Musculoskeletal: No worrisome lytic or sclerotic osseous abnormality. Review of the MIP images confirms the above findings. IMPRESSION: 1. No CT evidence for acute intramural hematoma or dissection of the thoracic aorta. 2. Stable ascending thoracic aortic aneurysm measuring up to 4.6 cm diameter. Ascending thoracic aortic aneurysm. Recommend semi-annual imaging followup by CTA or MRA and referral to cardiothoracic surgery if not already obtained. This recommendation follows 2010 ACCF/AHA/AATS/ACR/ASA/SCA/SCAI/SIR/STS/SVM Guidelines for the Diagnosis and Management of Patients With Thoracic Aortic  Disease. Circulation. 2010; [id number]. Aortic aneurysm NOS (ICD10-I71.9) 3. Coronary artery atherosclerosis. Electronically Signed   By: Misty Stanley M.D.   On: 06/06/2021 12:57    Procedures .Critical Care Performed by: Lianne Cure, DO Authorized by: Lianne Cure, DO   Critical care provider statement:    Critical care time (minutes):  34   Critical care was necessary to treat or prevent imminent or life-threatening deterioration of the following conditions: SBO.   Critical care was time spent personally by me on the following activities:  Development of treatment plan with patient or surrogate, discussions with consultants, evaluation of patient's response to treatment, examination of patient, ordering and review of laboratory studies, ordering and review of radiographic studies, ordering and performing treatments and interventions, pulse oximetry, re-evaluation of patient's condition and review of old charts Comments:     Admitting provider and general surgery discussions. NG tube placement. Eval for AAA with concerns for dissection.     Medications  Ordered in ED Medications  lidocaine (XYLOCAINE) 2 % jelly 1 application (has no administration in time range)  morphine 4 MG/ML injection 4 mg (4 mg Intravenous Given 06/06/21 1203)  ondansetron (ZOFRAN) injection 4 mg (4 mg Intravenous Given 06/06/21 1217)  iohexol (OMNIPAQUE) 350 MG/ML injection 100 mL (100 mLs Intravenous Contrast Given 06/06/21 1223)    ED Course/ Medical Decision Making/ A&P                           Medical Decision Making  1:54 PM 61 yo male with PMH of hypertension, hyperlipidemia, obesity, and known aortic aneurysm presenting for abdominal pain. Concern for dissection. ECG stable with no ST segment elevation or depression. trop wnl.  Pt taken directly to CT after r/o free air due to gastric distension and rigidity.  KUB stable. No free air. CT dissection demonstrates no dissection. Stable aortic  aneurysm as compared to previous images. Small bowel obstruction present. Pt placed NPO and NG tube ordered. Surgery consult and agreeable to be on consult. I spoke with hosptialist Dr. Marylyn Ishihara who agrees to accept patient.         Final Clinical Impression(s) / ED Diagnoses Final diagnoses:  Small bowel obstruction ([place])  Abdominal pain, unspecified abdominal location  Nausea and vomiting, unspecified vomiting type    Rx / DC Orders ED Discharge Orders     None         Lianne Cure, DO [id number]

## 2021-06-06 NOTE — ED Notes (Signed)
Small Bowel Follow Though started at [facility]. Next Abd xray to be done at 12am 06/07/21. Call Radiologist after to see when next film is due. Radiologist who initially read film was Kerby Moors. 100 mL of Omnipaque 350 was given though NT Tube. Contrast charged.

## 2021-06-06 NOTE — ED Notes (Signed)
[facility] called for transport to [place]. Pt was undergoing radiography and couldn't transport til now.

## 2021-06-07 ENCOUNTER — Encounter (HOSPITAL_COMMUNITY): Payer: Self-pay | Admitting: Internal Medicine

## 2021-06-07 ENCOUNTER — Inpatient Hospital Stay (HOSPITAL_COMMUNITY): Payer: BC Managed Care – PPO

## 2021-06-07 LAB — BASIC METABOLIC PANEL
Anion gap: 7 (ref 5–15)
BUN: 18 mg/dL (ref 6–20)
CO2: 27 mmol/L (ref 22–32)
Calcium: 8.7 mg/dL — ABNORMAL LOW (ref 8.9–10.3)
Chloride: 105 mmol/L (ref 98–111)
Creatinine, Ser: 0.78 mg/dL (ref 0.61–1.24)
GFR, Estimated: >60 mL/min (ref >60)
Glucose, Bld: 127 mg/dL — ABNORMAL HIGH (ref 70–99)
Potassium: 3.8 mmol/L (ref 3.5–5.1)
Sodium: 139 mmol/L (ref 135–145)

## 2021-06-07 LAB — MAGNESIUM: Magnesium: 2.5 mg/dL — ABNORMAL HIGH (ref 1.7–2.4)

## 2021-06-07 LAB — HIV ANTIBODY (ROUTINE TESTING W REFLEX): HIV Screen 4th Generation wRfx: NONREACTIVE

## 2021-06-07 MED ORDER — DIATRIZOATE MEGLUMINE & SODIUM 66-10 % PO SOLN
90.0000 mL | Freq: Once | ORAL | Status: AC
  Administered 2021-06-07: 90 mL via NASOGASTRIC
  Filled 2021-06-07: qty 90

## 2021-06-07 MED ORDER — HYDROMORPHONE HCL 1 MG/ML IJ SOLN
0.5000 mg | INTRAMUSCULAR | Status: DC | PRN
  Administered 2021-06-07 – 2021-06-08 (×4): 1 mg via INTRAVENOUS
  Filled 2021-06-07 (×4): qty 1

## 2021-06-07 NOTE — Progress Notes (Signed)
Writer has spoken to xray and informed of CT scan at 930 PM tonight. Continuing to have to frequently flush the NG tube to break up the thick output. Have suctioned 950cc this shift today. Spouse at bedside and pt stating he is "feeling better". Wants to "go home tomorrow". Have informed receiving nurse of CT scan.

## 2021-06-07 NOTE — Progress Notes (Signed)
Gastrofantin given NG and clamped for one hour. Pt ambulated 400 feet in hallway without distress. Suction has resumed. So far today, have 650cc output. Abdomen has drecreased in size and pt states he is feeling better. Will continue to monitor.

## 2021-06-07 NOTE — Progress Notes (Signed)
Have arrived to room to find pt's NG tube needing advancing as the cockstop was way below body of pt. Advance about 7 inches and will get a xray to check  placement.

## 2021-06-07 NOTE — Progress Notes (Signed)
PROGRESS NOTE    Jonathan Burke  ZOX:[id number] DOB: 1960/11/14 DOA: 06/06/2021 PCP: Ileana Ladd, MD   Brief Narrative:  This 61 years old male with PMH significant for ascending aortic aneurysm followed by Dr. Lavinia Sharps, hypertension, hyperlipidemia, bicuspid aortic valve, BPH, GERD, OSA, history of cholecystectomy and appendicectomy presented in the ED with complaints of abdominal pain associated with nausea and vomiting.  CT abdomen and pelvis showed small bowel obstruction extending from mid jejunum to mid to distal ileum without a single discrete transition point. Patient is admitted for small bowel obstruction. General surgery is consulted,  NG tube was inserted for decompression.  Assessment & Plan:   Principal Problem:   SBO (small bowel obstruction) (HCC) Active Problems:   BPH (benign prostatic hyperplasia)   Aneurysm, ascending aorta   Bicuspid aortic valve   Chest pain  Small bowel obstruction: Patient presented with abdominal pain, nausea and vomiting. Patient reports history of multiple abdominal surgeries and history of recurrent prior bowel obstructions. CT abdomen showing small bowel obstruction extending from the mid jejunum to the mid to distal ileum without a single transition point. General surgery consulted, advised bowel rest. Continue NG tube decompression, Continue IV hydration. Continue adequate pain control, Antiemetics as needed. Serial abdominal exams.  Atypical chest pain: ACS less likely as high-sensitivity troponin negative. Patient had a stress test in July 2022 which was normal. CT dissection study negative. Patient reports chest pain has resolved.  Ascending thoracic aortic aneurysm: Stable on CT scan. Patient follows with Dr. Sherie Don.  Hypertension: Blood pressure remains a stable off blood pressure medications.  Hyperlipidemia Hold Pravastatin as patient is NPO.  BPH: Resume meds once able to take p.o.  GERD : Resume Protonix once able  to take p.o.  DVT prophylaxis: SCDs Code Status: Full code Family Communication: (Spouse at bedside Disposition Plan:   Status is: Inpatient  Remains inpatient appropriate because:  Admitted for small bowel obstruction requiring NG tube insertion, IV hydration pain control and bowel rest.  Consultants:  General surgery  Procedures: CT abdomen and pelvis Antimicrobials: None.  Subjective:. Patient was seen and examined at bedside.  Overnight events noted. Patient reports abdominal pain has slowly improved.  Able to pass flatus but has not had bowel movement yet.  Objective: Vitals:   06/06/21 1924 06/07/21 0001 06/07/21 0333 06/07/21 0831  BP: 128/86 121/72 134/84 134/84  Pulse: 75 83 78 84  Resp: 20 20 20 19   Temp: 97.8 F (36.6 C) 98 F (36.7 C) 98.5 F (36.9 C) 98.6 F (37 C)  TempSrc: Oral Oral Oral Oral  SpO2: 96% 93% 93% 94%    Intake/Output Summary (Last 24 hours) at 06/07/2021 1346 Last data filed at 06/07/2021 0600 Gross per 24 hour  Intake --  Output 50 ml  Net -50 ml   There were no vitals filed for this visit.  Examination:  General exam: Appears comfortable, not in any acute distress. Respiratory system: Clear to auscultation bilaterally, respiratory effort normal, RR 15 Cardiovascular system: S1-S2 heard, regular rate and rhythm, no murmur. Gastrointestinal system: Abdomen is soft, mildly distended, mildly tender, BS+ Central nervous system: Alert and oriented x 3. No focal neurological deficits. Extremities: No edema, no cyanosis, no clubbing. Skin: No rashes, lesions or ulcers Psychiatry: Judgement and insight appear normal. Mood & affect appropriate.     Data Reviewed: I have personally reviewed following labs and imaging studies  CBC: Recent Labs  Lab 06/06/21 1152  WBC 10.6*  NEUTROABS 8.3*  HGB 15.8  HCT 46.8  MCV 91.2  PLT 316   Basic Metabolic Panel: Recent Labs  Lab 06/06/21 1152 06/07/21 0603  NA 138 139  K 3.9 3.8  CL  103 105  CO2 26 27  GLUCOSE 114* 127*  BUN 15 18  CREATININE 0.82 0.78  CALCIUM 9.8 8.7*  MG  --  2.5*   GFR: CrCl cannot be calculated (Unknown ideal weight.). Liver Function Tests: Recent Labs  Lab 06/06/21 1152  AST 27  ALT 41  ALKPHOS 46  BILITOT 0.7  PROT 8.6*  ALBUMIN 4.7   Recent Labs  Lab 06/06/21 1152  LIPASE 21   No results for input(s): AMMONIA in the last 168 hours. Coagulation Profile: No results for input(s): INR, PROTIME in the last 168 hours. Cardiac Enzymes: No results for input(s): CKTOTAL, CKMB, CKMBINDEX, TROPONINI in the last 168 hours. BNP (last 3 results) No results for input(s): PROBNP in the last 8760 hours. HbA1C: No results for input(s): HGBA1C in the last 72 hours. CBG: No results for input(s): GLUCAP in the last 168 hours. Lipid Profile: No results for input(s): CHOL, HDL, LDLCALC, TRIG, CHOLHDL, LDLDIRECT in the last 72 hours. Thyroid Function Tests: No results for input(s): TSH, T4TOTAL, FREET4, T3FREE, THYROIDAB in the last 72 hours. Anemia Panel: No results for input(s): VITAMINB12, FOLATE, FERRITIN, TIBC, IRON, RETICCTPCT in the last 72 hours. Sepsis Labs: No results for input(s): PROCALCITON, LATICACIDVEN in the last 168 hours.  Recent Results (from the past 240 hour(s))  Resp Panel by RT-PCR (Flu A&B, Covid) Nasopharyngeal Swab     Status: None   Collection Time: 06/06/21  1:56 PM   Specimen: Nasopharyngeal Swab; Nasopharyngeal(NP) swabs in vial transport medium  Result Value Ref Range Status   SARS Coronavirus 2 by RT PCR NEGATIVE NEGATIVE Final    Comment: (NOTE) SARS-CoV-2 target nucleic acids are NOT DETECTED.  The SARS-CoV-2 RNA is generally detectable in upper respiratory specimens during the acute phase of infection. The lowest concentration of SARS-CoV-2 viral copies this assay can detect is 138 copies/mL. A negative result does not preclude SARS-Cov-2 infection and should not be used as the sole basis for  treatment or other patient management decisions. A negative result may occur with  improper specimen collection/handling, submission of specimen other than nasopharyngeal swab, presence of viral mutation(s) within the areas targeted by this assay, and inadequate number of viral copies(<138 copies/mL). A negative result must be combined with clinical observations, patient history, and epidemiological information. The expected result is Negative.  Fact Sheet for Patients:  BloggerCourse.com  Fact Sheet for Healthcare Providers:  SeriousBroker.it  This test is no t yet approved or cleared by the [place] FDA and  has been authorized for detection and/or diagnosis of SARS-CoV-2 by FDA under an Emergency Use Authorization (EUA). This EUA will remain  in effect (meaning this test can be used) for the duration of the COVID-19 declaration under Section 564(b)(1) of the Act, 21 U.S.C.section 360bbb-3(b)(1), unless the authorization is terminated  or revoked sooner.       Influenza A by PCR NEGATIVE NEGATIVE Final   Influenza B by PCR NEGATIVE NEGATIVE Final    Comment: (NOTE) The Xpert Xpress SARS-CoV-2/FLU/RSV plus assay is intended as an aid in the diagnosis of influenza from Nasopharyngeal swab specimens and should not be used as a sole basis for treatment. Nasal washings and aspirates are unacceptable for Xpert Xpress SARS-CoV-2/FLU/RSV testing.  Fact Sheet for Patients: BloggerCourse.com  Fact Sheet for Healthcare  Providers: SeriousBroker.ithttps://www.fda.gov/media/152162/download  This test is not yet approved or cleared by the [place] FDA and has been authorized for detection and/or diagnosis of SARS-CoV-2 by FDA under an Emergency Use Authorization (EUA). This EUA will remain in effect (meaning this test can be used) for the duration of the COVID-19 declaration under Section 564(b)(1) of the Act, 21  U.S.C. section 360bbb-3(b)(1), unless the authorization is terminated or revoked.  Performed at [facility], [address], [place], [place] [address]     Radiology Studies: DG Abd 1 View  Result Date: 06/06/2021 CLINICAL DATA:  Small bowel obstruction. Baseline small bowel study. EXAM: ABDOMEN - 1 VIEW COMPARISON:  CT AP 06/06/2021 FINDINGS: 100 cc of Omnipaque 350 were administered to the patient through indwelling nasogastric tube. This shows contrast within the body of the stomach. Dilated small bowel loops are again noted within the central abdomen. IMPRESSION: Baseline study after injection of water-soluble contrast material into the gastric lumen to assess bowel obstruction. Repeat examination will be performed in 8 hours to assess contrast transit through the small bowel. Electronically Signed   By: Signa Kellaylor  Stroud M.D.   On: 06/06/2021 16:41   DG Abdomen 1 View  Result Date: 06/06/2021 CLINICAL DATA:  Chest pain, abdominal distention EXAM: ABDOMEN - 1 VIEW COMPARISON:  07/09/2016 FINDINGS: Gas distended loops of proximal to mid small bowel in the left and central abdomen, largest loops measuring up to 4.7 cm in caliber. No obvious free air on supine radiograph. No radio-opaque calculi or other significant radiographic abnormality are seen. IMPRESSION: Gas distended loops of proximal to mid small bowel in the left and central abdomen, largest loops measuring up to 4.7 cm in caliber. Findings may reflect ileus or obstruction. Consider CT to further evaluate. Electronically Signed   By: Jearld LeschAlex D Bibbey M.D.   On: 06/06/2021 12:23   CT ABDOMEN PELVIS W CONTRAST  Result Date: 06/06/2021 CLINICAL DATA:  Abdominal pain. EXAM: CT ABDOMEN AND PELVIS WITH CONTRAST TECHNIQUE: Multidetector CT imaging of the abdomen and pelvis was performed using the standard protocol following bolus administration of intravenous contrast. CONTRAST:  100mL OMNIPAQUE IOHEXOL 350 MG/ML SOLN COMPARISON:   June 15, 2019 FINDINGS: Lower chest: The visualized portion of the ascending thoracic aorta measures up to 4.8 cm. See the CT pulmonary angiogram for better evaluation of the thoracic aorta. Calcified atherosclerosis seen in the left coronary arteries. The heart is unremarkable. No other abnormality seen in the lower chest. Hepatobiliary: Patient is status post cholecystectomy. Hepatic steatosis is identified. No focal liver lesions are noted. The portal vein is well opacified with no obstruction identified. Pancreas: Unremarkable. No pancreatic ductal dilatation or surrounding inflammatory changes. Spleen: Normal in size without focal abnormality. Adrenals/Urinary Tract: Adrenal glands are unremarkable. Kidneys are normal, without renal calculi, focal lesion, or hydronephrosis. Bladder is unremarkable. Stomach/Bowel: The stomach is mildly distended. There is small bowel dilatation beginning in the mid jejunum and extending into at least the mid ileum. The most distal aspect of the ileum is decompressed. A discrete transition point is not identified. The colon is decompressed. The appendix is been removed. Vascular/Lymphatic: The abdominal aorta is nonaneurysmal. No adenopathy. Reproductive: Prostate is unremarkable. Other: There is a ventral hernia seen on series 2, image 55 containing omentum. There is another ventral hernia seen on series 2, image 41 containing omentum. No free air free fluid. Musculoskeletal: No acute or significant osseous findings. IMPRESSION: 1. Small-bowel obstruction extending from the mid jejunum into the mid to distal ileum  without a single discrete transition point. The distal ileum and colon are decompressed. 2. The ascending thoracic aorta is aneurysmal measuring up to 4.8 cm. Please see the CT pulmonary angiogram report from today for further evaluation. 3. Coronary artery disease. 4. Previous cholecystectomy. 5. Hepatic steatosis. 6. 2 ventral hernias containing omentum.  Electronically Signed   By: Gerome Samavid  Williams III M.D.   On: 06/06/2021 13:03   DG Chest Portable 1 View  Result Date: 06/06/2021 CLINICAL DATA:  Chest pain and upper abdominal pain. EXAM: PORTABLE CHEST 1 VIEW COMPARISON:  04/03/2020. FINDINGS: Cardiac silhouette is normal in size. No mediastinal or hilar masses. Clear lungs.  No pleural effusion or pneumothorax. Skeletal structures are grossly intact. IMPRESSION: No active disease. Electronically Signed   By: Amie Portlandavid  Ormond M.D.   On: 06/06/2021 12:23   DG Abd Portable 1V-Small Bowel Protocol-Position Verification  Result Date: 06/07/2021 CLINICAL DATA:  NG tube placement. EXAM: PORTABLE ABDOMEN - 1 VIEW COMPARISON:  06/06/2021 and older exams. FINDINGS: Nasal/orogastric tube passes below the diaphragm, tip in the left upper quadrant consistent with positioning in the gastric fundus. Residual contrast noted in the proximal stomach. Small-bowel dilation noted in the upper abdomen similar to the previous day's study. IMPRESSION: 1. Well-positioned nasal/orogastric tube. 2. Persistent small dilation. Electronically Signed   By: Amie Portlandavid  Ormond M.D.   On: 06/07/2021 10:17   DG Abd Portable 1V  Result Date: 06/06/2021 CLINICAL DATA:  Small bowel obstruction. Evaluate transit of enteric contrast material. This is the 1 hour post administration of enteric contrast material. EXAM: PORTABLE ABDOMEN - 1 VIEW COMPARISON:  11/04/2021 at 4:14 p.m. FINDINGS: NG tube tip remains within the stomach. Enteric contrast material is again noted within the gastric body. No significant small bowel contrast identified at this time. Persistent dilated loops of small bowel appear unchanged from the previous exam. IMPRESSION: 1. Stasis of enteric contrast material noted within the gastric lumen. No significant small bowel contrast identified at this time. A repeat radiograph of the abdomen will be obtained in 8 hours to assess transit of the enteric contrast material. Electronically  Signed   By: Signa Kellaylor  Stroud M.D.   On: 06/06/2021 17:22   DG Abd Portable 1V-Small Bowel Protocol-Position Verification  Result Date: 06/06/2021 CLINICAL DATA:  NG tube placement verification. EXAM: PORTABLE ABDOMEN - 1 VIEW COMPARISON:  CT examination performed earlier on the same date FINDINGS: Dilated small bowel loop measuring up to 4.3 cm concerning for bowel obstruction/ileus. NG tube with side port just below the GE junction and tip of the tube overlying the body of the stomach. Lung bases are clear. IMPRESSION: NG tube in satisfactory position. Dilated small bowel loops measuring up to 4.3 cm concerning for ileus/obstruction. Electronically Signed   By: Larose HiresImran  Ahmed D.O.   On: 06/06/2021 15:55   CT Angio Chest Aorta W and/or Wo Contrast  Result Date: 06/06/2021 CLINICAL DATA:  Chest pain.  Midsternal. EXAM: CT ANGIOGRAPHY CHEST WITH CONTRAST TECHNIQUE: Multidetector CT imaging of the chest was performed using the standard protocol during bolus administration of intravenous contrast. Multiplanar CT image reconstructions and MIPs were obtained to evaluate the vascular anatomy. CONTRAST:  100mL OMNIPAQUE IOHEXOL 350 MG/ML SOLN COMPARISON:  09/10/2020 FINDINGS: Cardiovascular: Pre contrast imaging performed as part of this study shows no hyperdense crescent in the wall of the thoracic aorta to suggest the presence of an acute intramural hematoma. The heart size is normal. No substantial pericardial effusion. Coronary artery calcification is evident. Ascending thoracic aorta measures  4.6 cm diameter, no substantial change from 4.5 cm previously. No evidence for thoracic aortic dissection. No large central pulmonary embolus in the pulmonary outflow tract or main pulmonary arteries. No evidence for segmental pulmonary embolus. Mediastinum/Nodes: No mediastinal lymphadenopathy. There is no hilar lymphadenopathy. The esophagus has normal imaging features. There is no axillary lymphadenopathy. Lungs/Pleura: No  suspicious pulmonary nodule or mass. No focal airspace consolidation. No pleural effusion. Upper Abdomen: See report for abdomen/pelvis CT performed at the same time. Musculoskeletal: No worrisome lytic or sclerotic osseous abnormality. Review of the MIP images confirms the above findings. IMPRESSION: 1. No CT evidence for acute intramural hematoma or dissection of the thoracic aorta. 2. Stable ascending thoracic aortic aneurysm measuring up to 4.6 cm diameter. Ascending thoracic aortic aneurysm. Recommend semi-annual imaging followup by CTA or MRA and referral to cardiothoracic surgery if not already obtained. This recommendation follows 2010 ACCF/AHA/AATS/ACR/ASA/SCA/SCAI/SIR/STS/SVM Guidelines for the Diagnosis and Management of Patients With Thoracic Aortic Disease. Circulation. 2010; 121: Y301-S010. Aortic aneurysm NOS (ICD10-I71.9) 3. Coronary artery atherosclerosis. Electronically Signed   By: Kennith Center M.D.   On: 06/06/2021 12:57    Scheduled Meds: Continuous Infusions:   LOS: 1 day    Time spent: 50 MINS    Thinh Cuccaro, MD [facility]   If 7PM-7AM, please contact night-coverage

## 2021-06-07 NOTE — Progress Notes (Signed)
Since arrival to unit pt has only had 50cc output from NG tube until advancement and xray showing NG tube in place. Flushed NG tube and was able to get output better regulated. Will cover pt's nausea and pain and give Gastrofantin.

## 2021-06-07 NOTE — Consult Note (Signed)
Reason for Consult: abdominal pain, small bowel obstruction  Referring Physician: ER physician, [facility]  Jonathan Burke is an 61 y.o. male.  HPI: Patient is a 61 year old male admitted to the medical service on transfer from the emergency department with signs and symptoms of small bowel obstruction.  Patient has a 3-day history of progressive abdominal discomfort and distention.  He has had watery diarrhea.  Evaluation included a CT scan of the abdomen and pelvis demonstrating findings consistent with small bowel obstruction without discrete transition point.  Patient has had multiple prior episodes of small bowel obstruction.  These are believed to be related to adhesions.  Previous abdominal surgery includes appendectomy as a child and cholecystectomy.  Patient underwent laparotomy with lysis of adhesions in 2014 by my partner, Dr. Carman Chingoug Blackman.  Patient has a ventral incisional hernia.  This is not the cause of obstruction.  Patient has multiple medical problems.  He is also followed for aortic aneurysm by Dr. Evelene CroonBryan Bartle.  Patient owns a [employer].  He is accompanied by his wife.  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   Chest pain    Complication of anesthesia    states is hard to wake up   Dental crowns present    also caps   Dilated aortic root (HCC)    Enlarged prostate    GERD (gastroesophageal reflux disease)    daily OTC   H/O hiatal hernia    pt unsure if hernia or not, but has a knot in his stomach   Headache(784.0)    tension   PONV (postoperative nausea and vomiting)    Sleep apnea    pt non compliant with CPAP usage   Small bowel obstruction (HCC) 07/17/2012    Past Surgical History:  Procedure Laterality Date   APPENDECTOMY     CARPAL TUNNEL RELEASE  09/09/2011   Procedure: CARPAL TUNNEL RELEASE;  Surgeon: Wyn Forsterobert V Sypher Jr., MD;  Location: [facility];  Service: Orthopedics;  Laterality: Left;   CHOLECYSTECTOMY     DIRECT LARYNGOSCOPY  07/07/2001   suspension microdirect laryngoscopy with exc. left vocal cord mass   ESOPHAGOGASTRODUODENOSCOPY N/A 06/05/2013   Procedure: ESOPHAGOGASTRODUODENOSCOPY (EGD);  Surgeon: Vertell NovakJames L Edwards Jr., MD;  [facility];  Service: Endoscopy;  Laterality: N/A;   LAPAROSCOPIC LYSIS OF ADHESIONS N/A 05/29/2013   Procedure: DIAGNOSTIC LAPAROSCOPY CONVERTED TO EXPLORATORY LAPAROTOMY, LYSIS OF ADHESIONS ;  Surgeon: Shelly Rubensteinouglas A Blackman, MD;  Location: [facility];  Service: General;  Laterality: N/A;   LUMBAR LAMINECTOMY/DECOMPRESSION MICRODISCECTOMY  10/29/1999   L5-S1   SHOULDER SURGERY     left   TEE WITHOUT CARDIOVERSION N/A 11/05/2019   Procedure: TRANSESOPHAGEAL ECHOCARDIOGRAM (TEE);  Surgeon: Iran OuchArida, Muhammad A, MD;  Location: [facility];  Service: Cardiovascular;  Laterality: N/A;    Family History  Problem Relation Age of Onset   COPD Mother    Heart disease Mother    Rectal cancer Mother    Heart attack Father    Arthritis Sister    Hypertension Brother    Hypertension Sister    Anuerysm Sister    Stroke Sister    Arthritis Sister        RA    Social History:  reports that he has never smoked. He has never used smokeless tobacco. He reports that he does not drink  alcohol and does not use drugs.  Allergies:  Allergies  Allergen Reactions   Adhesive [Tape] Other (See Comments)    Regular tape pulls OFF the skin!! Only paper tape is tolerated.   Atorvastatin Other (See Comments)    Made the patient's bones hurt    Medications: I have reviewed the patient's current medications.  Results for orders placed or performed during the hospital encounter of 06/06/21 (from the past 48 hour(s))  CBC with Differential     Status: Abnormal   Collection Time: 06/06/21 11:52 AM  Result Value Ref Range   WBC 10.6 (H) 4.0 - 10.5 K/uL   RBC 5.13 4.22 - 5.81 MIL/uL   Hemoglobin 15.8 13.0  - 17.0 g/dL   HCT 38.4 66.5 - 99.3 %   MCV 91.2 80.0 - 100.0 fL   MCH 30.8 26.0 - 34.0 pg   MCHC 33.8 30.0 - 36.0 g/dL   RDW 57.0 17.7 - 93.9 %   Platelets 316 150 - 400 K/uL   nRBC 0.0 0.0 - 0.2 %   Neutrophils Relative % 78 %   Neutro Abs 8.3 (H) 1.7 - 7.7 K/uL   Lymphocytes Relative 12 %   Lymphs Abs 1.3 0.7 - 4.0 K/uL   Monocytes Relative 9 %   Monocytes Absolute 0.9 0.1 - 1.0 K/uL   Eosinophils Relative 1 %   Eosinophils Absolute 0.1 0.0 - 0.5 K/uL   Basophils Relative 0 %   Basophils Absolute 0.0 0.0 - 0.1 K/uL   Immature Granulocytes 0 %   Abs Immature Granulocytes 0.02 0.00 - 0.07 K/uL    Comment: Performed at [facility], [address] [place], [place] [address]  Troponin I (High Sensitivity)     Status: None   Collection Time: 06/06/21 11:52 AM  Result Value Ref Range   Troponin I (High Sensitivity) <2 <18 ng/L    Comment: (NOTE) Elevated high sensitivity troponin I (hsTnI) values and significant  changes across serial measurements may suggest ACS but many other  chronic and acute conditions are known to elevate hsTnI results.  Refer to the "Links" section for chest pain algorithms and additional  guidance. Performed at [facility], [address] [place], [place] [address]   Comprehensive metabolic panel     Status: Abnormal   Collection Time: 06/06/21 11:52 AM  Result Value Ref Range   Sodium 138 135 - 145 mmol/L   Potassium 3.9 3.5 - 5.1 mmol/L   Chloride 103 98 - 111 mmol/L   CO2 26 22 - 32 mmol/L   Glucose, Bld 114 (H) 70 - 99 mg/dL    Comment: Glucose reference range applies only to samples taken after fasting for at least 8 hours.   BUN 15 6 - 20 mg/dL   Creatinine, Ser 7.62 0.61 - 1.24 mg/dL   Calcium 9.8 8.9 - 26.3 mg/dL   Total Protein 8.6 (H) 6.5 - 8.1 g/dL   Albumin 4.7 3.5 - 5.0 g/dL   AST 27 15 - 41 U/L   ALT 41 0 - 44 U/L   Alkaline Phosphatase 46 38 - 126 U/L   Total Bilirubin 0.7 0.3 - 1.2 mg/dL    GFR, Estimated >33 >54 mL/min    Comment: (NOTE) Calculated using the CKD-EPI Creatinine Equation (2021)    Anion gap 9 5 - 15    Comment: Performed at [facility], [address] [place], [place] [address]  Lipase, blood     Status: None  Collection Time: 06/06/21 11:52 AM  Result Value Ref Range   Lipase 21 11 - 51 U/L    Comment: Performed at [facility], [address] [place], [place] [address]  Troponin I (High Sensitivity)     Status: None   Collection Time: 06/06/21  1:48 PM  Result Value Ref Range   Troponin I (High Sensitivity) <2 <18 ng/L    Comment: (NOTE) Elevated high sensitivity troponin I (hsTnI) values and significant  changes across serial measurements may suggest ACS but many other  chronic and acute conditions are known to elevate hsTnI results.  Refer to the "Links" section for chest pain algorithms and additional  guidance. Performed at [facility], [address], [place], [place] [address]   Resp Panel by RT-PCR (Flu A&B, Covid) Nasopharyngeal Swab     Status: None   Collection Time: 06/06/21  1:56 PM   Specimen: Nasopharyngeal Swab; Nasopharyngeal(NP) swabs in vial transport medium  Result Value Ref Range   SARS Coronavirus 2 by RT PCR NEGATIVE NEGATIVE    Comment: (NOTE) SARS-CoV-2 target nucleic acids are NOT DETECTED.  The SARS-CoV-2 RNA is generally detectable in upper respiratory specimens during the acute phase of infection. The lowest concentration of SARS-CoV-2 viral copies this assay can detect is 138 copies/mL. A negative result does not preclude SARS-Cov-2 infection and should not be used as the sole basis for treatment or other patient management decisions. A negative result may occur with  improper specimen collection/handling, submission of specimen other than nasopharyngeal swab, presence of viral mutation(s) within the areas targeted by this assay, and inadequate  number of viral copies(<138 copies/mL). A negative result must be combined with clinical observations, patient history, and epidemiological information. The expected result is Negative.  Fact Sheet for Patients:  BloggerCourse.com  Fact Sheet for Healthcare Providers:  SeriousBroker.it  This test is no t yet approved or cleared by the [place] FDA and  has been authorized for detection and/or diagnosis of SARS-CoV-2 by FDA under an Emergency Use Authorization (EUA). This EUA will remain  in effect (meaning this test can be used) for the duration of the COVID-19 declaration under Section 564(b)(1) of the Act, 21 U.S.C.section 360bbb-3(b)(1), unless the authorization is terminated  or revoked sooner.       Influenza A by PCR NEGATIVE NEGATIVE   Influenza B by PCR NEGATIVE NEGATIVE    Comment: (NOTE) The Xpert Xpress SARS-CoV-2/FLU/RSV plus assay is intended as an aid in the diagnosis of influenza from Nasopharyngeal swab specimens and should not be used as a sole basis for treatment. Nasal washings and aspirates are unacceptable for Xpert Xpress SARS-CoV-2/FLU/RSV testing.  Fact Sheet for Patients: BloggerCourse.com  Fact Sheet for Healthcare Providers: SeriousBroker.it  This test is not yet approved or cleared by the [place] FDA and has been authorized for detection and/or diagnosis of SARS-CoV-2 by FDA under an Emergency Use Authorization (EUA). This EUA will remain in effect (meaning this test can be used) for the duration of the COVID-19 declaration under Section 564(b)(1) of the Act, 21 U.S.C. section 360bbb-3(b)(1), unless the authorization is terminated or revoked.  Performed at [facility], [address], [place], [place] [address]     DG Abd 1 View  Result Date: 06/06/2021 CLINICAL DATA:  Small bowel obstruction. Baseline small  bowel study. EXAM: ABDOMEN - 1 VIEW COMPARISON:  CT AP 06/06/2021 FINDINGS: 100 cc of Omnipaque 350 were administered to the patient through indwelling nasogastric tube. This shows  contrast within the body of the stomach. Dilated small bowel loops are again noted within the central abdomen. IMPRESSION: Baseline study after injection of water-soluble contrast material into the gastric lumen to assess bowel obstruction. Repeat examination will be performed in 8 hours to assess contrast transit through the small bowel. Electronically Signed   By: Signa Kellaylor  Stroud M.D.   On: 06/06/2021 16:41   DG Abdomen 1 View  Result Date: 06/06/2021 CLINICAL DATA:  Chest pain, abdominal distention EXAM: ABDOMEN - 1 VIEW COMPARISON:  07/09/2016 FINDINGS: Gas distended loops of proximal to mid small bowel in the left and central abdomen, largest loops measuring up to 4.7 cm in caliber. No obvious free air on supine radiograph. No radio-opaque calculi or other significant radiographic abnormality are seen. IMPRESSION: Gas distended loops of proximal to mid small bowel in the left and central abdomen, largest loops measuring up to 4.7 cm in caliber. Findings may reflect ileus or obstruction. Consider CT to further evaluate. Electronically Signed   By: Jearld LeschAlex D Bibbey M.D.   On: 06/06/2021 12:23   CT ABDOMEN PELVIS W CONTRAST  Result Date: 06/06/2021 CLINICAL DATA:  Abdominal pain. EXAM: CT ABDOMEN AND PELVIS WITH CONTRAST TECHNIQUE: Multidetector CT imaging of the abdomen and pelvis was performed using the standard protocol following bolus administration of intravenous contrast. CONTRAST:  100mL OMNIPAQUE IOHEXOL 350 MG/ML SOLN COMPARISON:  June 15, 2019 FINDINGS: Lower chest: The visualized portion of the ascending thoracic aorta measures up to 4.8 cm. See the CT pulmonary angiogram for better evaluation of the thoracic aorta. Calcified atherosclerosis seen in the left coronary arteries. The heart is unremarkable. No other  abnormality seen in the lower chest. Hepatobiliary: Patient is status post cholecystectomy. Hepatic steatosis is identified. No focal liver lesions are noted. The portal vein is well opacified with no obstruction identified. Pancreas: Unremarkable. No pancreatic ductal dilatation or surrounding inflammatory changes. Spleen: Normal in size without focal abnormality. Adrenals/Urinary Tract: Adrenal glands are unremarkable. Kidneys are normal, without renal calculi, focal lesion, or hydronephrosis. Bladder is unremarkable. Stomach/Bowel: The stomach is mildly distended. There is small bowel dilatation beginning in the mid jejunum and extending into at least the mid ileum. The most distal aspect of the ileum is decompressed. A discrete transition point is not identified. The colon is decompressed. The appendix is been removed. Vascular/Lymphatic: The abdominal aorta is nonaneurysmal. No adenopathy. Reproductive: Prostate is unremarkable. Other: There is a ventral hernia seen on series 2, image 55 containing omentum. There is another ventral hernia seen on series 2, image 41 containing omentum. No free air free fluid. Musculoskeletal: No acute or significant osseous findings. IMPRESSION: 1. Small-bowel obstruction extending from the mid jejunum into the mid to distal ileum without a single discrete transition point. The distal ileum and colon are decompressed. 2. The ascending thoracic aorta is aneurysmal measuring up to 4.8 cm. Please see the CT pulmonary angiogram report from today for further evaluation. 3. Coronary artery disease. 4. Previous cholecystectomy. 5. Hepatic steatosis. 6. 2 ventral hernias containing omentum. Electronically Signed   By: Gerome Samavid  Williams III M.D.   On: 06/06/2021 13:03   DG Chest Portable 1 View  Result Date: 06/06/2021 CLINICAL DATA:  Chest pain and upper abdominal pain. EXAM: PORTABLE CHEST 1 VIEW COMPARISON:  04/03/2020. FINDINGS: Cardiac silhouette is normal in size. No mediastinal  or hilar masses. Clear lungs.  No pleural effusion or pneumothorax. Skeletal structures are grossly intact. IMPRESSION: No active disease. Electronically Signed   By: Renard Hamperavid  Ormond M.D.  On: 06/06/2021 12:23   DG Abd Portable 1V  Result Date: 06/06/2021 CLINICAL DATA:  Small bowel obstruction. Evaluate transit of enteric contrast material. This is the 1 hour post administration of enteric contrast material. EXAM: PORTABLE ABDOMEN - 1 VIEW COMPARISON:  11/04/2021 at 4:14 p.m. FINDINGS: NG tube tip remains within the stomach. Enteric contrast material is again noted within the gastric body. No significant small bowel contrast identified at this time. Persistent dilated loops of small bowel appear unchanged from the previous exam. IMPRESSION: 1. Stasis of enteric contrast material noted within the gastric lumen. No significant small bowel contrast identified at this time. A repeat radiograph of the abdomen will be obtained in 8 hours to assess transit of the enteric contrast material. Electronically Signed   By: Signa Kell M.D.   On: 06/06/2021 17:22   DG Abd Portable 1V-Small Bowel Protocol-Position Verification  Result Date: 06/06/2021 CLINICAL DATA:  NG tube placement verification. EXAM: PORTABLE ABDOMEN - 1 VIEW COMPARISON:  CT examination performed earlier on the same date FINDINGS: Dilated small bowel loop measuring up to 4.3 cm concerning for bowel obstruction/ileus. NG tube with side port just below the GE junction and tip of the tube overlying the body of the stomach. Lung bases are clear. IMPRESSION: NG tube in satisfactory position. Dilated small bowel loops measuring up to 4.3 cm concerning for ileus/obstruction. Electronically Signed   By: Larose Hires D.O.   On: 06/06/2021 15:55   CT Angio Chest Aorta W and/or Wo Contrast  Result Date: 06/06/2021 CLINICAL DATA:  Chest pain.  Midsternal. EXAM: CT ANGIOGRAPHY CHEST WITH CONTRAST TECHNIQUE: Multidetector CT imaging of the chest was performed  using the standard protocol during bolus administration of intravenous contrast. Multiplanar CT image reconstructions and MIPs were obtained to evaluate the vascular anatomy. CONTRAST:  OMNIPAQUE IOHEXOL 350 MG/ML SOLN COMPARISON:  09/10/2020 FINDINGS: Cardiovascular: Pre contrast imaging performed as part of this study shows no hyperdense crescent in the wall of the thoracic aorta to suggest the presence of an acute intramural hematoma. The heart size is normal. No substantial pericardial effusion. Coronary artery calcification is evident. Ascending thoracic aorta measures 4.6 cm diameter, no substantial change from 4.5 cm previously. No evidence for thoracic aortic dissection. No large central pulmonary embolus in the pulmonary outflow tract or main pulmonary arteries. No evidence for segmental pulmonary embolus. Mediastinum/Nodes: No mediastinal lymphadenopathy. There is no hilar lymphadenopathy. The esophagus has normal imaging features. There is no axillary lymphadenopathy. Lungs/Pleura: No suspicious pulmonary nodule or mass. No focal airspace consolidation. No pleural effusion. Upper Abdomen: See report for abdomen/pelvis CT performed at the same time. Musculoskeletal: No worrisome lytic or sclerotic osseous abnormality. Review of the MIP images confirms the above findings. IMPRESSION: 1. No CT evidence for acute intramural hematoma or dissection of the thoracic aorta. 2. Stable ascending thoracic aortic aneurysm measuring up to 4.6 cm diameter. Ascending thoracic aortic aneurysm. Recommend semi-annual imaging followup by CTA or MRA and referral to cardiothoracic surgery if not already obtained. This recommendation follows 2010 ACCF/AHA/AATS/ACR/ASA/SCA/SCAI/SIR/STS/SVM Guidelines for the Diagnosis and Management of Patients With Thoracic Aortic Disease. Circulation. 2010; 121: S568-L275. Aortic aneurysm NOS (ICD10-I71.9) 3. Coronary artery atherosclerosis. Electronically Signed   By: Kennith Center M.D.    On: 06/06/2021 12:57    Review of Systems  Constitutional:  Positive for appetite change.  HENT: Negative.    Eyes: Negative.   Respiratory: Negative.    Cardiovascular: Negative.   Gastrointestinal:  Positive for abdominal distention, abdominal pain  and diarrhea.  Endocrine: Negative.   Genitourinary: Negative.   Musculoskeletal: Negative.   Skin: Negative.   Allergic/Immunologic: Negative.   Neurological: Negative.   Hematological: Negative.   Psychiatric/Behavioral: Negative.     Physical Exam  Blood pressure 134/84, pulse 78, temperature 98.5 F (36.9 C), temperature source Oral, resp. rate 20, SpO2 93 %.  CONSTITUTIONAL: no acute distress; conversant; no obvious deformities  HEENT: Conjunctiva clear and moist; pupils equal bilaterally; NG tube in place  NECK: trachea midline; no thyroid nodularity  LUNGS: respiratory effort normal & unlabored; no wheeze; no rales  CV: rate and rhythm regular; no significant murmur; no edema bilat lower extremities  GI: abdomen is moderately distended, firm, with diffuse mild tenderness to palpation; midline ventral hernia is soft and reducible; bowel sounds are present on auscultation  MSK: normal range of motion of extremities; no clubbing; no cyanosis  PSYCH: appropriate affect for situation; alert and oriented to person, place, & time  LYMPHATIC: no palpable cervical lymphadenopathy    Assessment/Plan:  Recurrent small bowel obstruction  Agree with admission to the medical service for management  NG decompression, IV hydration  Will re-initiate small bowel protocol this AM  Encouraged OOB, ambulation  Patient is admitted with small bowel obstruction.  He has had multiple episodes of small bowel obstruction over the years.  His last operative intervention was by Dr. Carman Ching in 2014.  Some of these episodes the patient has been able to manage at home with restricted in his diet.  We discussed management with  nasogastric decompression.  We discussed performing the small bowel protocol with radiographs.  We discussed the possible need for operative intervention.  The patient and his wife understand and agree to proceed.  Darnell Level, MD [facility] practice Office: [phone]   Darnell Level 06/07/2021, 6:23 AM

## 2021-06-08 LAB — PHOSPHORUS: Phosphorus: 2.9 mg/dL (ref 2.5–4.6)

## 2021-06-08 LAB — BASIC METABOLIC PANEL
Anion gap: 7 (ref 5–15)
BUN: 20 mg/dL (ref 6–20)
CO2: 27 mmol/L (ref 22–32)
Calcium: 8.6 mg/dL — ABNORMAL LOW (ref 8.9–10.3)
Chloride: 107 mmol/L (ref 98–111)
Creatinine, Ser: 0.75 mg/dL (ref 0.61–1.24)
GFR, Estimated: >60 mL/min (ref >60)
Glucose, Bld: 106 mg/dL — ABNORMAL HIGH (ref 70–99)
Potassium: 3.6 mmol/L (ref 3.5–5.1)
Sodium: 141 mmol/L (ref 135–145)

## 2021-06-08 LAB — CBC
HCT: 42.9 % (ref 39.0–52.0)
Hemoglobin: 14 g/dL (ref 13.0–17.0)
MCH: 31.4 pg (ref 26.0–34.0)
MCHC: 32.6 g/dL (ref 30.0–36.0)
MCV: 96.2 fL (ref 80.0–100.0)
Platelets: 263 10*3/uL (ref 150–400)
RBC: 4.46 MIL/uL (ref 4.22–5.81)
RDW: 12.9 % (ref 11.5–15.5)
WBC: 10.7 10*3/uL — ABNORMAL HIGH (ref 4.0–10.5)
nRBC: 0 % (ref 0.0–0.2)

## 2021-06-08 LAB — MAGNESIUM: Magnesium: 2.2 mg/dL (ref 1.7–2.4)

## 2021-06-08 MED ORDER — ORAL CARE MOUTH RINSE
15.0000 mL | Freq: Two times a day (BID) | OROMUCOSAL | Status: DC
  Administered 2021-06-08: 15 mL via OROMUCOSAL

## 2021-06-08 NOTE — Progress Notes (Signed)
Progress Note     Subjective: Abdominal pain and bloating significantly improved. Still sore over ventral hernia. No nausea or emesis with NG. He has had less than 2 cups of ice over last 24 hours. Has had 2 bowel movements. Last one this am  Objective: Vital signs in last 24 hours: Temp:  [98 F (36.7 C)-98.6 F (37 C)] 98.4 F (36.9 C) (01/09 0534) Pulse Rate:  [75-84] 75 (01/09 0534) Resp:  [19-20] 20 (01/09 0534) BP: (129-141)/(81-88) 129/81 (01/09 0534) SpO2:  [94 %-96 %] 94 % (01/09 0534) Last BM Date: 06/07/21  Intake/Output from previous day: 01/08 0701 - 01/09 0700 In: 10 [NG/GT:10] Out: 1150 [Emesis/NG output:1150] Intake/Output this shift: No intake/output data recorded.  PE: General: pleasant, WD, male who is laying in bed in NAD HEENT: head is normocephalic, atraumatic. Mouth is pink and moist Heart: regular, rate, and rhythm. Palpable radial pulses bilaterally Lungs: CTAB. Respiratory effort nonlabored Abd: soft, mildly TTP without rebound or guarding over ventral hernia which is soft and reduces on exam. Mildly distended NGT with thin clear yellow output MSK: all 4 extremities are symmetrical with no cyanosis, clubbing, or edema. Skin: warm and dry Psych: A&Ox3 with an appropriate affect.    Lab Results:  Recent Labs    06/06/21 1152 06/08/21 0509  WBC 10.6* 10.7*  HGB 15.8 14.0  HCT 46.8 42.9  PLT 316 263   BMET Recent Labs    06/07/21 0603 06/08/21 0509  NA 139 141  K 3.8 3.6  CL 105 107  CO2 27 27  GLUCOSE 127* 106*  BUN 18 20  CREATININE 0.78 0.75  CALCIUM 8.7* 8.6*   PT/INR No results for input(s): LABPROT, INR in the last 72 hours. CMP     Component Value Date/Time   NA 141 06/08/2021 0509   NA 139 10/31/2019 0942   K 3.6 06/08/2021 0509   CL 107 06/08/2021 0509   CO2 27 06/08/2021 0509   GLUCOSE 106 (H) 06/08/2021 0509   BUN 20 06/08/2021 0509   BUN 15 10/31/2019 0942   CREATININE 0.75 06/08/2021 0509   CALCIUM 8.6  (L) 06/08/2021 0509   PROT 8.6 (H) 06/06/2021 1152   PROT 7.7 08/23/2019 1158   ALBUMIN 4.7 06/06/2021 1152   ALBUMIN 4.6 08/23/2019 1158   AST 27 06/06/2021 1152   ALT 41 06/06/2021 1152   ALKPHOS 46 06/06/2021 1152   BILITOT 0.7 06/06/2021 1152   BILITOT 0.4 08/23/2019 1158   GFRNONAA >60 06/08/2021 0509   GFRAA 114 10/31/2019 0942   Lipase     Component Value Date/Time   LIPASE 21 06/06/2021 1152       Studies/Results: DG Abd 1 View  Result Date: 06/06/2021 CLINICAL DATA:  Small bowel obstruction. Baseline small bowel study. EXAM: ABDOMEN - 1 VIEW COMPARISON:  CT AP 06/06/2021 FINDINGS: 100 cc of Omnipaque 350 were administered to the patient through indwelling nasogastric tube. This shows contrast within the body of the stomach. Dilated small bowel loops are again noted within the central abdomen. IMPRESSION: Baseline study after injection of water-soluble contrast material into the gastric lumen to assess bowel obstruction. Repeat examination will be performed in 8 hours to assess contrast transit through the small bowel. Electronically Signed   By: Kerby Moors M.D.   On: 06/06/2021 16:41   DG Abdomen 1 View  Result Date: 06/06/2021 CLINICAL DATA:  Chest pain, abdominal distention EXAM: ABDOMEN - 1 VIEW COMPARISON:  07/09/2016 FINDINGS: Gas distended loops of  proximal to mid small bowel in the left and central abdomen, largest loops measuring up to 4.7 cm in caliber. No obvious free air on supine radiograph. No radio-opaque calculi or other significant radiographic abnormality are seen. IMPRESSION: Gas distended loops of proximal to mid small bowel in the left and central abdomen, largest loops measuring up to 4.7 cm in caliber. Findings may reflect ileus or obstruction. Consider CT to further evaluate. Electronically Signed   By: Jearld Lesch M.D.   On: 06/06/2021 12:23   CT ABDOMEN PELVIS W CONTRAST  Result Date: 06/06/2021 CLINICAL DATA:  Abdominal pain. EXAM: CT ABDOMEN AND  PELVIS WITH CONTRAST TECHNIQUE: Multidetector CT imaging of the abdomen and pelvis was performed using the standard protocol following bolus administration of intravenous contrast. CONTRAST:  OMNIPAQUE IOHEXOL 350 MG/ML SOLN COMPARISON:  June 15, 2019 FINDINGS: Lower chest: The visualized portion of the ascending thoracic aorta measures up to 4.8 cm. See the CT pulmonary angiogram for better evaluation of the thoracic aorta. Calcified atherosclerosis seen in the left coronary arteries. The heart is unremarkable. No other abnormality seen in the lower chest. Hepatobiliary: Patient is status post cholecystectomy. Hepatic steatosis is identified. No focal liver lesions are noted. The portal vein is well opacified with no obstruction identified. Pancreas: Unremarkable. No pancreatic ductal dilatation or surrounding inflammatory changes. Spleen: Normal in size without focal abnormality. Adrenals/Urinary Tract: Adrenal glands are unremarkable. Kidneys are normal, without renal calculi, focal lesion, or hydronephrosis. Bladder is unremarkable. Stomach/Bowel: The stomach is mildly distended. There is small bowel dilatation beginning in the mid jejunum and extending into at least the mid ileum. The most distal aspect of the ileum is decompressed. A discrete transition point is not identified. The colon is decompressed. The appendix is been removed. Vascular/Lymphatic: The abdominal aorta is nonaneurysmal. No adenopathy. Reproductive: Prostate is unremarkable. Other: There is a ventral hernia seen on series 2, image 55 containing omentum. There is another ventral hernia seen on series 2, image 41 containing omentum. No free air free fluid. Musculoskeletal: No acute or significant osseous findings. IMPRESSION: 1. Small-bowel obstruction extending from the mid jejunum into the mid to distal ileum without a single discrete transition point. The distal ileum and colon are decompressed. 2. The ascending thoracic aorta is  aneurysmal measuring up to 4.8 cm. Please see the CT pulmonary angiogram report from today for further evaluation. 3. Coronary artery disease. 4. Previous cholecystectomy. 5. Hepatic steatosis. 6. 2 ventral hernias containing omentum. Electronically Signed   By: Gerome Sam III M.D.   On: 06/06/2021 13:03   DG Chest Portable 1 View  Result Date: 06/06/2021 CLINICAL DATA:  Chest pain and upper abdominal pain. EXAM: PORTABLE CHEST 1 VIEW COMPARISON:  04/03/2020. FINDINGS: Cardiac silhouette is normal in size. No mediastinal or hilar masses. Clear lungs.  No pleural effusion or pneumothorax. Skeletal structures are grossly intact. IMPRESSION: No active disease. Electronically Signed   By: Amie Portland M.D.   On: 06/06/2021 12:23   DG Abd Portable 1V-Small Bowel Obstruction Protocol-initial, 8 hr delay  Result Date: 06/07/2021 CLINICAL DATA:  Small bowel obstruction, 8 hour delay. EXAM: PORTABLE ABDOMEN - 1 VIEW COMPARISON:  Radiographs earlier today.  CT yesterday. FINDINGS: Enteric contrast is seen in the ascending, transverse, descending and rectosigmoid colon. Mild persisting gaseous distention of small bowel in the central abdomen. IMPRESSION: Enteric contrast throughout the colon. Mild persisting gaseous distention of small bowel in the central abdomen. Findings consistent with either resolving or partial small bowel obstruction.  Electronically Signed   By: Keith Rake M.D.   On: 06/07/2021 21:37   DG Abd Portable 1V-Small Bowel Protocol-Position Verification  Result Date: 06/07/2021 CLINICAL DATA:  NG tube placement. EXAM: PORTABLE ABDOMEN - 1 VIEW COMPARISON:  06/06/2021 and older exams. FINDINGS: Nasal/orogastric tube passes below the diaphragm, tip in the left upper quadrant consistent with positioning in the gastric fundus. Residual contrast noted in the proximal stomach. Small-bowel dilation noted in the upper abdomen similar to the previous day's study. IMPRESSION: 1. Well-positioned  nasal/orogastric tube. 2. Persistent small dilation. Electronically Signed   By: Lajean Manes M.D.   On: 06/07/2021 10:17   DG Abd Portable 1V  Result Date: 06/06/2021 CLINICAL DATA:  Small bowel obstruction. Evaluate transit of enteric contrast material. This is the 1 hour post administration of enteric contrast material. EXAM: PORTABLE ABDOMEN - 1 VIEW COMPARISON:  11/04/2021 at 4:14 p.m. FINDINGS: NG tube tip remains within the stomach. Enteric contrast material is again noted within the gastric body. No significant small bowel contrast identified at this time. Persistent dilated loops of small bowel appear unchanged from the previous exam. IMPRESSION: 1. Stasis of enteric contrast material noted within the gastric lumen. No significant small bowel contrast identified at this time. A repeat radiograph of the abdomen will be obtained in 8 hours to assess transit of the enteric contrast material. Electronically Signed   By: Kerby Moors M.D.   On: 06/06/2021 17:22   DG Abd Portable 1V-Small Bowel Protocol-Position Verification  Result Date: 06/06/2021 CLINICAL DATA:  NG tube placement verification. EXAM: PORTABLE ABDOMEN - 1 VIEW COMPARISON:  CT examination performed earlier on the same date FINDINGS: Dilated small bowel loop measuring up to 4.3 cm concerning for bowel obstruction/ileus. NG tube with side port just below the GE junction and tip of the tube overlying the body of the stomach. Lung bases are clear. IMPRESSION: NG tube in satisfactory position. Dilated small bowel loops measuring up to 4.3 cm concerning for ileus/obstruction. Electronically Signed   By: Keane Police D.O.   On: 06/06/2021 15:55   CT Angio Chest Aorta W and/or Wo Contrast  Result Date: 06/06/2021 CLINICAL DATA:  Chest pain.  Midsternal. EXAM: CT ANGIOGRAPHY CHEST WITH CONTRAST TECHNIQUE: Multidetector CT imaging of the chest was performed using the standard protocol during bolus administration of intravenous contrast.  Multiplanar CT image reconstructions and MIPs were obtained to evaluate the vascular anatomy. CONTRAST:  155mL OMNIPAQUE IOHEXOL 350 MG/ML SOLN COMPARISON:  09/10/2020 FINDINGS: Cardiovascular: Pre contrast imaging performed as part of this study shows no hyperdense crescent in the wall of the thoracic aorta to suggest the presence of an acute intramural hematoma. The heart size is normal. No substantial pericardial effusion. Coronary artery calcification is evident. Ascending thoracic aorta measures 4.6 cm diameter, no substantial change from 4.5 cm previously. No evidence for thoracic aortic dissection. No large central pulmonary embolus in the pulmonary outflow tract or main pulmonary arteries. No evidence for segmental pulmonary embolus. Mediastinum/Nodes: No mediastinal lymphadenopathy. There is no hilar lymphadenopathy. The esophagus has normal imaging features. There is no axillary lymphadenopathy. Lungs/Pleura: No suspicious pulmonary nodule or mass. No focal airspace consolidation. No pleural effusion. Upper Abdomen: See report for abdomen/pelvis CT performed at the same time. Musculoskeletal: No worrisome lytic or sclerotic osseous abnormality. Review of the MIP images confirms the above findings. IMPRESSION: 1. No CT evidence for acute intramural hematoma or dissection of the thoracic aorta. 2. Stable ascending thoracic aortic aneurysm measuring up to 4.6 cm  diameter. Ascending thoracic aortic aneurysm. Recommend semi-annual imaging followup by CTA or MRA and referral to cardiothoracic surgery if not already obtained. This recommendation follows 2010 ACCF/AHA/AATS/ACR/ASA/SCA/SCAI/SIR/STS/SVM Guidelines for the Diagnosis and Management of Patients With Thoracic Aortic Disease. Circulation. 2010; [id number]. Aortic aneurysm NOS (ICD10-I71.9) 3. Coronary artery atherosclerosis. Electronically Signed   By: Misty Stanley M.D.   On: 06/06/2021 12:57    Anti-infectives: Anti-infectives (From admission,  onward)    None        Assessment/Plan Recurrent SBO - CT scan 1/7 with Small-bowel obstruction extending from the mid jejunum into the mid to distal ileum without a single discrete transition point. The distal ileum and colon are decompressed. - h/o multple SBOs and last operative intervention by Dr. Ninfa Linden in 2014  - abd xray 8 hr delay with contrast throughout colon -  He has had Bms but NGT output remains high (>1 L). Clamp NGT for now and start clears. Possible NGT removal and diet advancement today  FEN: clamp NGT, clears ID: none indicated VTE: okay for chemical prophylaxis from surgical standpoint  Moderate Medical Decision Making - abdominal xray personally reviewed   LOS: 2 days   Winferd Humphrey, [facility] 06/08/2021, 8:00 AM Please see Amion for pager number during day hours 7:00am-4:30pm

## 2021-06-08 NOTE — Progress Notes (Addendum)
PROGRESS NOTE    Jonathan Burke  YSA:[id number] DOB: Oct 23, 1960 DOA: 06/06/2021 PCP: Ileana Ladd, MD   Brief Narrative:  This 61 years old male with PMH significant for ascending aortic aneurysm followed by Dr. Lavinia Sharps, hypertension, hyperlipidemia, bicuspid aortic valve, BPH, GERD, OSA, history of cholecystectomy and appendicectomy presented in the ED with complaints of abdominal pain associated with nausea and vomiting.  CT abdomen and pelvis showed small bowel obstruction extending from mid jejunum to mid to distal ileum without a single discrete transition point. Patient is admitted for partial small bowel obstruction. General surgery is consulted,  NG tube was inserted for decompression.  Assessment & Plan:   Principal Problem:   SBO (small bowel obstruction) (HCC) Active Problems:   BPH (benign prostatic hyperplasia)   Aneurysm, ascending aorta   Bicuspid aortic valve   Chest pain  Small bowel obstruction: Patient presented with abdominal pain, nausea and vomiting. Patient reports history of multiple abdominal surgeries and history of recurrent prior bowel obstructions. CT abdomen showing small bowel obstruction extending from the mid jejunum to the mid to distal ileum without a single transition point. General surgery consulted, advised bowel rest. Continue NG tube decompression, Continue IV hydration. Continue adequate pain control, Antiemetics as needed. Patient reports improvement in abdominal pain, able to pass flatus.  He had  small bowel movement. X-ray shows contrast noted throughout colon with resolving SBO. Clamp NG tube, start clear liquid diet, Possible NG removal and diet advancement today.  Atypical chest pain: ACS less likely as high-sensitivity troponin negative. Patient had  stress test in July 2022 which was normal. CT dissection study negative. Patient reports chest pain has resolved.  Ascending thoracic aortic aneurysm: Stable on CT scan. Patient  follows with Dr. Sherie Don.  Hypertension: Blood pressure remains a stable off blood pressure medications.  Hyperlipidemia Resume pravastatin.  BPH: Resume meds once able to take p.o.  GERD : Resume Protonix   DVT prophylaxis: SCDs Code Status: Full code Family Communication: (Spouse at bedside Disposition Plan:   Status is: Inpatient  Remains inpatient appropriate because:  Admitted for small bowel obstruction requiring NG tube insertion, IV hydration pain control and bowel rest.  Consultants:  General surgery  Procedures: CT abdomen and pelvis Antimicrobials: None.  Subjective:. Patient was seen and examined at bedside.  Overnight events noted. Patient reports abdominal pain has improved.  He is able to pass flatus.   He had a small bowel movement.  Objective: Vitals:   06/07/21 1512 06/07/21 2011 06/07/21 2047 06/08/21 0534  BP: (!) 141/88  139/86 129/81  Pulse: 76  82 75  Resp: 19  20 20   Temp: 98 F (36.7 C)  98.6 F (37 C) 98.4 F (36.9 C)  TempSrc: Oral  Oral Oral  SpO2: 96%  94% 94%  Height:  5\' 8"  (1.727 m)      Intake/Output Summary (Last 24 hours) at 06/08/2021 1320 Last data filed at 06/08/2021 0530 Gross per 24 hour  Intake --  Output 1150 ml  Net -1150 ml   There were no vitals filed for this visit.  Examination:  General exam: Appears comfortable, not in any acute distress. Respiratory system: Clear to auscultation bilaterally, respiratory effort normal, RR 15 Cardiovascular system: S1-S2 heard, regular rate and rhythm, no murmur. Gastrointestinal system: Abdomen is soft, nontender, nondistended, BS+ Central nervous system: Alert and oriented x 3. No focal neurological deficits. Extremities: No edema, no cyanosis, no clubbing. Skin: No rashes, lesions or ulcers Psychiatry: Judgement and  insight appear normal. Mood & affect appropriate.     Data Reviewed: I have personally reviewed following labs and imaging studies  CBC: Recent Labs   Lab 06/06/21 1152 06/08/21 0509  WBC 10.6* 10.7*  NEUTROABS 8.3*  --   HGB 15.8 14.0  HCT 46.8 42.9  MCV 91.2 96.2  PLT 316 263   Basic Metabolic Panel: Recent Labs  Lab 06/06/21 1152 06/07/21 0603 06/08/21 0509  NA 138 139 141  K 3.9 3.8 3.6  CL 103 105 107  CO2 26 27 27   GLUCOSE 114* 127* 106*  BUN 15 18 20   CREATININE 0.82 0.78 0.75  CALCIUM 9.8 8.7* 8.6*  MG  --  2.5* 2.2  PHOS  --   --  2.9   GFR: CrCl cannot be calculated (Unknown ideal weight.). Liver Function Tests: Recent Labs  Lab 06/06/21 1152  AST 27  ALT 41  ALKPHOS 46  BILITOT 0.7  PROT 8.6*  ALBUMIN 4.7   Recent Labs  Lab 06/06/21 1152  LIPASE 21   No results for input(s): AMMONIA in the last 168 hours. Coagulation Profile: No results for input(s): INR, PROTIME in the last 168 hours. Cardiac Enzymes: No results for input(s): CKTOTAL, CKMB, CKMBINDEX, TROPONINI in the last 168 hours. BNP (last 3 results) No results for input(s): PROBNP in the last 8760 hours. HbA1C: No results for input(s): HGBA1C in the last 72 hours. CBG: No results for input(s): GLUCAP in the last 168 hours. Lipid Profile: No results for input(s): CHOL, HDL, LDLCALC, TRIG, CHOLHDL, LDLDIRECT in the last 72 hours. Thyroid Function Tests: No results for input(s): TSH, T4TOTAL, FREET4, T3FREE, THYROIDAB in the last 72 hours. Anemia Panel: No results for input(s): VITAMINB12, FOLATE, FERRITIN, TIBC, IRON, RETICCTPCT in the last 72 hours. Sepsis Labs: No results for input(s): PROCALCITON, LATICACIDVEN in the last 168 hours.  Recent Results (from the past 240 hour(s))  Resp Panel by RT-PCR (Flu A&B, Covid) Nasopharyngeal Swab     Status: None   Collection Time: 06/06/21  1:56 PM   Specimen: Nasopharyngeal Swab; Nasopharyngeal(NP) swabs in vial transport medium  Result Value Ref Range Status   SARS Coronavirus 2 by RT PCR NEGATIVE NEGATIVE Final    Comment: (NOTE) SARS-CoV-2 target nucleic acids are NOT  DETECTED.  The SARS-CoV-2 RNA is generally detectable in upper respiratory specimens during the acute phase of infection. The lowest concentration of SARS-CoV-2 viral copies this assay can detect is 138 copies/mL. A negative result does not preclude SARS-Cov-2 infection and should not be used as the sole basis for treatment or other patient management decisions. A negative result may occur with  improper specimen collection/handling, submission of specimen other than nasopharyngeal swab, presence of viral mutation(s) within the areas targeted by this assay, and inadequate number of viral copies(<138 copies/mL). A negative result must be combined with clinical observations, patient history, and epidemiological information. The expected result is Negative.  Fact Sheet for Patients:  08/04/21  Fact Sheet for Healthcare Providers:  08/04/21  This test is no t yet approved or cleared by the [employer] FDA and  has been authorized for detection and/or diagnosis of SARS-CoV-2 by FDA under an Emergency Use Authorization (EUA). This EUA will remain  in effect (meaning this test can be used) for the duration of the COVID-19 declaration under Section 564(b)(1) of the Act, 21 U.S.C.section 360bbb-3(b)(1), unless the authorization is terminated  or revoked sooner.       Influenza A by PCR NEGATIVE NEGATIVE Final  Influenza B by PCR NEGATIVE NEGATIVE Final    Comment: (NOTE) The Xpert Xpress SARS-CoV-2/FLU/RSV plus assay is intended as an aid in the diagnosis of influenza from Nasopharyngeal swab specimens and should not be used as a sole basis for treatment. Nasal washings and aspirates are unacceptable for Xpert Xpress SARS-CoV-2/FLU/RSV testing.  Fact Sheet for Patients: [employer]https://www.fda.gov/media/152166/download  Fact Sheet for Healthcare Providers: Serious[employer]ker.ithttps://www.fda.gov/media/152162/download  This test is not yet  approved or cleared by the [place] FDA and has been authorized for detection and/or diagnosis of SARS-CoV-2 by FDA under an Emergency Use Authorization (EUA). This EUA will remain in effect (meaning this test can be used) for the duration of the COVID-19 declaration under Section 564(b)(1) of the Act, 21 U.S.C. section 360bbb-3(b)(1), unless the authorization is terminated or revoked.  Performed at [facility], [address], [place], [place] [address]     Radiology Studies: DG Abd 1 View  Result Date: 06/06/2021 CLINICAL DATA:  Small bowel obstruction. Baseline small bowel study. EXAM: ABDOMEN - 1 VIEW COMPARISON:  CT AP 06/06/2021 FINDINGS: 100 cc of Omnipaque 350 were administered to the patient through indwelling nasogastric tube. This shows contrast within the body of the stomach. Dilated small bowel loops are again noted within the central abdomen. IMPRESSION: Baseline study after injection of water-soluble contrast material into the gastric lumen to assess bowel obstruction. Repeat examination will be performed in 8 hours to assess contrast transit through the small bowel. Electronically Signed   By: Signa Kellaylor  Stroud M.D.   On: 06/06/2021 16:41   DG Abd Portable 1V-Small Bowel Obstruction Protocol-initial, 8 hr delay  Result Date: 06/07/2021 CLINICAL DATA:  Small bowel obstruction, 8 hour delay. EXAM: PORTABLE ABDOMEN - 1 VIEW COMPARISON:  Radiographs earlier today.  CT yesterday. FINDINGS: Enteric contrast is seen in the ascending, transverse, descending and rectosigmoid colon. Mild persisting gaseous distention of small bowel in the central abdomen. IMPRESSION: Enteric contrast throughout the colon. Mild persisting gaseous distention of small bowel in the central abdomen. Findings consistent with either resolving or partial small bowel obstruction. Electronically Signed   By: Narda RutherfordMelanie  Sanford M.D.   On: 06/07/2021 21:37   DG Abd Portable 1V-Small Bowel  Protocol-Position Verification  Result Date: 06/07/2021 CLINICAL DATA:  NG tube placement. EXAM: PORTABLE ABDOMEN - 1 VIEW COMPARISON:  06/06/2021 and older exams. FINDINGS: Nasal/orogastric tube passes below the diaphragm, tip in the left upper quadrant consistent with positioning in the gastric fundus. Residual contrast noted in the proximal stomach. Small-bowel dilation noted in the upper abdomen similar to the previous day's study. IMPRESSION: 1. Well-positioned nasal/orogastric tube. 2. Persistent small dilation. Electronically Signed   By: Amie Portlandavid  Ormond M.D.   On: 06/07/2021 10:17   DG Abd Portable 1V  Result Date: 06/06/2021 CLINICAL DATA:  Small bowel obstruction. Evaluate transit of enteric contrast material. This is the 1 hour post administration of enteric contrast material. EXAM: PORTABLE ABDOMEN - 1 VIEW COMPARISON:  11/04/2021 at 4:14 p.m. FINDINGS: NG tube tip remains within the stomach. Enteric contrast material is again noted within the gastric body. No significant small bowel contrast identified at this time. Persistent dilated loops of small bowel appear unchanged from the previous exam. IMPRESSION: 1. Stasis of enteric contrast material noted within the gastric lumen. No significant small bowel contrast identified at this time. A repeat radiograph of the abdomen will be obtained in 8 hours to assess transit of the enteric contrast material. Electronically Signed   By: Veronda Prudeaylor  Stroud M.D.  On: 06/06/2021 17:22   DG Abd Portable 1V-Small Bowel Protocol-Position Verification  Result Date: 06/06/2021 CLINICAL DATA:  NG tube placement verification. EXAM: PORTABLE ABDOMEN - 1 VIEW COMPARISON:  CT examination performed earlier on the same date FINDINGS: Dilated small bowel loop measuring up to 4.3 cm concerning for bowel obstruction/ileus. NG tube with side port just below the GE junction and tip of the tube overlying the body of the stomach. Lung bases are clear. IMPRESSION: NG tube in  satisfactory position. Dilated small bowel loops measuring up to 4.3 cm concerning for ileus/obstruction. Electronically Signed   By: Larose HiresImran  Ahmed D.O.   On: 06/06/2021 15:55    Scheduled Meds:  mouth rinse  15 mL Mouth Rinse q12n4p   Continuous Infusions:   LOS: 2 days    Time spent: 35 MINS    Felicie Kocher, MD [facility]   If 7PM-7AM, please contact night-coverage

## 2021-06-09 NOTE — Progress Notes (Signed)
Went over discharge instructions w/ pt. Pt verbalized understanding.  

## 2021-06-09 NOTE — Discharge Summary (Signed)
Physician Discharge Summary  Jonathan Burke [id number] DOB: 07-17-1960 DOA: 06/06/2021  PCP: Vernie Shanks, MD  Admit date: 06/06/2021  Discharge date: 06/09/2021  Admitted From: Home.  Disposition:  Home.  Recommendations for Outpatient Follow-up:  Follow up with PCP in 1-2 weeks. Please obtain BMP/CBC in one week Patient feels much better.  Small bowel obstruction has resolved.  Home Health: None Equipment/Devices:None  Discharge Condition: Good CODE STATUS:Full code Diet recommendation: Heart Healthy  Brief [facility] Course: This 61 years old male with PMH significant for ascending aortic aneurysm followed by Dr. Caffie Pinto, hypertension, hyperlipidemia, bicuspid aortic valve, BPH, GERD, OSA, history of cholecystectomy and appendicectomy presented in the ED with complaints of abdominal pain associated with nausea and vomiting. CT abdomen and pelvis showed small bowel obstruction extending from mid jejunum to mid to distal ileum without a single discrete transition point. Patient was admitted for partial small bowel obstruction. General surgery was consulted,  NG tube was inserted for decompression.  Patient was continued on IV hydration and bowel rest with adequate pain control.  Patient has slowly improved with conservative management.  He was able to pass flatus,  subsequently had a bowel movement.  X-ray shows resolving small bowel obstruction.  NG tube removed. Patient was started on clear liquid diet,  advanced to full liquid to soft diet,  he tolerated well.  Cleared from general surgery. Patient is being discharged home.  He was managed for below problems.   Discharge Diagnoses:  Principal Problem:   SBO (small bowel obstruction) (HCC) Active Problems:   BPH (benign prostatic hyperplasia)   Aneurysm, ascending aorta   Bicuspid aortic valve   Chest pain  Small bowel obstruction: Patient presented with abdominal pain, nausea and vomiting. Patient reports history  of multiple abdominal surgeries and history of recurrent prior bowel obstructions. CT abdomen showing small bowel obstruction extending from the mid jejunum to the mid to distal ileum without a single transition point. General surgery consulted, advised bowel rest. Continue NG tube decompression, Continue IV hydration. Continue adequate pain control, Antiemetics as needed. Patient reports improvement in abdominal pain, able to pass flatus.  He had  small bowel movement. X-ray shows contrast noted throughout colon with resolving SBO. Clamp NG tube, start clear liquid diet, NG tube removed, diet advanced to full liquid to soft diet.  Tolerated well. Small bowel obstruction resolved.  Patient is being discharged home   Atypical chest pain: ACS less likely as high-sensitivity troponin negative. Patient had  stress test in July 2022 which was normal. CT dissection study negative. Patient reports chest pain has resolved.   Ascending thoracic aortic aneurysm: Stable on CT scan. Patient follows with Dr. Nell Range.   Hypertension: Blood pressure remains a stable off blood pressure medications.   Hyperlipidemia Resume pravastatin.   BPH: Resume meds once able to take p.o.   GERD : Resume Protonix   Discharge Instructions  Discharge Instructions     Call MD for:  difficulty breathing, headache or visual disturbances   Complete by: As directed    Call MD for:  persistant dizziness or light-headedness   Complete by: As directed    Call MD for:  persistant nausea and vomiting   Complete by: As directed    Diet - low sodium heart healthy   Complete by: As directed    Diet Carb Modified   Complete by: As directed    Discharge instructions   Complete by: As directed    Advised to follow-up with  primary care physician in 1 week. Patient feels much better.  Small bowel obstruction has resolved.   Increase activity slowly   Complete by: As directed       Allergies as of 06/09/2021        Reactions   Adhesive [tape] Other (See Comments)   Regular tape pulls OFF the skin!! Only paper tape is tolerated.   Atorvastatin Other (See Comments)   Made the patient's bones hurt        Medication List     TAKE these medications    Acetaminophen 500 MG capsule Take 500-1,000 mg by mouth every 6 (six) hours as needed for pain (or headaches).   albuterol 108 (90 Base) MCG/ACT inhaler Commonly known as: VENTOLIN HFA Inhale 2 puffs into the lungs every 6 (six) hours as needed for wheezing or shortness of breath.   Artificial Tears PF 0.1-0.3 % Soln Generic drug: Dextran 70-Hypromellose (PF) Place 1 drop into both eyes 3 (three) times daily as needed (for dryness).   aspirin EC 81 MG tablet Take 81 mg by mouth daily.   losartan 25 MG tablet Commonly known as: COZAAR TAKE ONE (1) TABLET EACH DAY What changed: See the new instructions.   Lumify 0.025 % Soln Generic drug: Brimonidine Tartrate 1 drop See admin instructions. Instill 1 drop into affected eye(s) every six to eight hours as needed for redness   pantoprazole 20 MG tablet Commonly known as: PROTONIX TAKE ONE (1) TABLET BY MOUTH EVERY DAY What changed:  how much to take when to take this   pravastatin 20 MG tablet Commonly known as: PRAVACHOL Take 0.5 tablets (10 mg total) by mouth daily. What changed: when to take this   tadalafil 5 MG tablet Commonly known as: CIALIS Take 5 mg by mouth daily.   vitamin B-12 500 MCG tablet Commonly known as: CYANOCOBALAMIN Take 500-1,000 mcg by mouth daily.   vitamin C 500 MG tablet Commonly known as: ASCORBIC ACID Take 500 mg by mouth daily.   Vitamin D3 50 MCG (2000 UT) Tabs Take 2,000 Units by mouth daily.   zinc gluconate 50 MG tablet Take 50 mg by mouth daily.        Follow-up Information     Vernie Shanks, MD Follow up in 1 week(s).   Specialty: Family Medicine Contact information: [place] [address] [phone]          Wellington Hampshire, MD .   Specialty: Cardiology Contact information: [place] [address] [phone]         [facility], [place] Follow up.   Specialty: General Surgery Why: As needed. please call with questions or concerns Contact information: [address] [place] [address] [phone]                Allergies  Allergen Reactions   Adhesive [Tape] Other (See Comments)    Regular tape pulls OFF the skin!! Only paper tape is tolerated.   Atorvastatin Other (See Comments)    Made the patient's bones hurt    Consultations: General Surgery   Procedures/Studies: DG Abd 1 View  Result Date: 06/06/2021 CLINICAL DATA:  Small bowel obstruction. Baseline small bowel study. EXAM: ABDOMEN - 1 VIEW COMPARISON:  CT AP 06/06/2021 FINDINGS: 100 cc of Omnipaque 350 were administered to the patient through indwelling nasogastric tube. This shows contrast within the body of the stomach. Dilated small bowel loops are again noted  within the central abdomen. IMPRESSION: Baseline study after injection of water-soluble contrast material into the gastric lumen to assess bowel obstruction. Repeat examination will be performed in 8 hours to assess contrast transit through the small bowel. Electronically Signed   By: Kerby Moors M.D.   On: 06/06/2021 16:41   DG Abdomen 1 View  Result Date: 06/06/2021 CLINICAL DATA:  Chest pain, abdominal distention EXAM: ABDOMEN - 1 VIEW COMPARISON:  07/09/2016 FINDINGS: Gas distended loops of proximal to mid small bowel in the left and central abdomen, largest loops measuring up to 4.7 cm in caliber. No obvious free air on supine radiograph. No radio-opaque calculi or other significant radiographic abnormality are seen. IMPRESSION: Gas distended loops of proximal to mid small bowel in the left and central abdomen, largest loops measuring up to 4.7 cm in caliber. Findings may reflect ileus or  obstruction. Consider CT to further evaluate. Electronically Signed   By: Delanna Ahmadi M.D.   On: 06/06/2021 12:23   CT ABDOMEN PELVIS W CONTRAST  Result Date: 06/06/2021 CLINICAL DATA:  Abdominal pain. EXAM: CT ABDOMEN AND PELVIS WITH CONTRAST TECHNIQUE: Multidetector CT imaging of the abdomen and pelvis was performed using the standard protocol following bolus administration of intravenous contrast. CONTRAST:  175mL OMNIPAQUE IOHEXOL 350 MG/ML SOLN COMPARISON:  June 15, 2019 FINDINGS: Lower chest: The visualized portion of the ascending thoracic aorta measures up to 4.8 cm. See the CT pulmonary angiogram for better evaluation of the thoracic aorta. Calcified atherosclerosis seen in the left coronary arteries. The heart is unremarkable. No other abnormality seen in the lower chest. Hepatobiliary: Patient is status post cholecystectomy. Hepatic steatosis is identified. No focal liver lesions are noted. The portal vein is well opacified with no obstruction identified. Pancreas: Unremarkable. No pancreatic ductal dilatation or surrounding inflammatory changes. Spleen: Normal in size without focal abnormality. Adrenals/Urinary Tract: Adrenal glands are unremarkable. Kidneys are normal, without renal calculi, focal lesion, or hydronephrosis. Bladder is unremarkable. Stomach/Bowel: The stomach is mildly distended. There is small bowel dilatation beginning in the mid jejunum and extending into at least the mid ileum. The most distal aspect of the ileum is decompressed. A discrete transition point is not identified. The colon is decompressed. The appendix is been removed. Vascular/Lymphatic: The abdominal aorta is nonaneurysmal. No adenopathy. Reproductive: Prostate is unremarkable. Other: There is a ventral hernia seen on series 2, image 55 containing omentum. There is another ventral hernia seen on series 2, image 41 containing omentum. No free air free fluid. Musculoskeletal: No acute or significant osseous  findings. IMPRESSION: 1. Small-bowel obstruction extending from the mid jejunum into the mid to distal ileum without a single discrete transition point. The distal ileum and colon are decompressed. 2. The ascending thoracic aorta is aneurysmal measuring up to 4.8 cm. Please see the CT pulmonary angiogram report from today for further evaluation. 3. Coronary artery disease. 4. Previous cholecystectomy. 5. Hepatic steatosis. 6. 2 ventral hernias containing omentum. Electronically Signed   By: Dorise Bullion III M.D.   On: 06/06/2021 13:03   DG Chest Portable 1 View  Result Date: 06/06/2021 CLINICAL DATA:  Chest pain and upper abdominal pain. EXAM: PORTABLE CHEST 1 VIEW COMPARISON:  04/03/2020. FINDINGS: Cardiac silhouette is normal in size. No mediastinal or hilar masses. Clear lungs.  No pleural effusion or pneumothorax. Skeletal structures are grossly intact. IMPRESSION: No active disease. Electronically Signed   By: Lajean Manes M.D.   On: 06/06/2021 12:23   DG Abd Portable 1V-Small Bowel Obstruction Protocol-initial,  8 hr delay  Result Date: 06/07/2021 CLINICAL DATA:  Small bowel obstruction, 8 hour delay. EXAM: PORTABLE ABDOMEN - 1 VIEW COMPARISON:  Radiographs earlier today.  CT yesterday. FINDINGS: Enteric contrast is seen in the ascending, transverse, descending and rectosigmoid colon. Mild persisting gaseous distention of small bowel in the central abdomen. IMPRESSION: Enteric contrast throughout the colon. Mild persisting gaseous distention of small bowel in the central abdomen. Findings consistent with either resolving or partial small bowel obstruction. Electronically Signed   By: Keith Rake M.D.   On: 06/07/2021 21:37   DG Abd Portable 1V-Small Bowel Protocol-Position Verification  Result Date: 06/07/2021 CLINICAL DATA:  NG tube placement. EXAM: PORTABLE ABDOMEN - 1 VIEW COMPARISON:  06/06/2021 and older exams. FINDINGS: Nasal/orogastric tube passes below the diaphragm, tip in the left  upper quadrant consistent with positioning in the gastric fundus. Residual contrast noted in the proximal stomach. Small-bowel dilation noted in the upper abdomen similar to the previous day's study. IMPRESSION: 1. Well-positioned nasal/orogastric tube. 2. Persistent small dilation. Electronically Signed   By: Lajean Manes M.D.   On: 06/07/2021 10:17   DG Abd Portable 1V  Result Date: 06/06/2021 CLINICAL DATA:  Small bowel obstruction. Evaluate transit of enteric contrast material. This is the 1 hour post administration of enteric contrast material. EXAM: PORTABLE ABDOMEN - 1 VIEW COMPARISON:  11/04/2021 at 4:14 p.m. FINDINGS: NG tube tip remains within the stomach. Enteric contrast material is again noted within the gastric body. No significant small bowel contrast identified at this time. Persistent dilated loops of small bowel appear unchanged from the previous exam. IMPRESSION: 1. Stasis of enteric contrast material noted within the gastric lumen. No significant small bowel contrast identified at this time. A repeat radiograph of the abdomen will be obtained in 8 hours to assess transit of the enteric contrast material. Electronically Signed   By: Kerby Moors M.D.   On: 06/06/2021 17:22   DG Abd Portable 1V-Small Bowel Protocol-Position Verification  Result Date: 06/06/2021 CLINICAL DATA:  NG tube placement verification. EXAM: PORTABLE ABDOMEN - 1 VIEW COMPARISON:  CT examination performed earlier on the same date FINDINGS: Dilated small bowel loop measuring up to 4.3 cm concerning for bowel obstruction/ileus. NG tube with side port just below the GE junction and tip of the tube overlying the body of the stomach. Lung bases are clear. IMPRESSION: NG tube in satisfactory position. Dilated small bowel loops measuring up to 4.3 cm concerning for ileus/obstruction. Electronically Signed   By: Keane Police D.O.   On: 06/06/2021 15:55   CT Angio Chest Aorta W and/or Wo Contrast  Result Date:  06/06/2021 CLINICAL DATA:  Chest pain.  Midsternal. EXAM: CT ANGIOGRAPHY CHEST WITH CONTRAST TECHNIQUE: Multidetector CT imaging of the chest was performed using the standard protocol during bolus administration of intravenous contrast. Multiplanar CT image reconstructions and MIPs were obtained to evaluate the vascular anatomy. CONTRAST:  143mL OMNIPAQUE IOHEXOL 350 MG/ML SOLN COMPARISON:  09/10/2020 FINDINGS: Cardiovascular: Pre contrast imaging performed as part of this study shows no hyperdense crescent in the wall of the thoracic aorta to suggest the presence of an acute intramural hematoma. The heart size is normal. No substantial pericardial effusion. Coronary artery calcification is evident. Ascending thoracic aorta measures 4.6 cm diameter, no substantial change from 4.5 cm previously. No evidence for thoracic aortic dissection. No large central pulmonary embolus in the pulmonary outflow tract or main pulmonary arteries. No evidence for segmental pulmonary embolus. Mediastinum/Nodes: No mediastinal lymphadenopathy. There is no hilar  lymphadenopathy. The esophagus has normal imaging features. There is no axillary lymphadenopathy. Lungs/Pleura: No suspicious pulmonary nodule or mass. No focal airspace consolidation. No pleural effusion. Upper Abdomen: See report for abdomen/pelvis CT performed at the same time. Musculoskeletal: No worrisome lytic or sclerotic osseous abnormality. Review of the MIP images confirms the above findings. IMPRESSION: 1. No CT evidence for acute intramural hematoma or dissection of the thoracic aorta. 2. Stable ascending thoracic aortic aneurysm measuring up to 4.6 cm diameter. Ascending thoracic aortic aneurysm. Recommend semi-annual imaging followup by CTA or MRA and referral to cardiothoracic surgery if not already obtained. This recommendation follows 2010 ACCF/AHA/AATS/ACR/ASA/SCA/SCAI/SIR/STS/SVM Guidelines for the Diagnosis and Management of Patients With Thoracic Aortic  Disease. Circulation. 2010; [id number]. Aortic aneurysm NOS (ICD10-I71.9) 3. Coronary artery atherosclerosis. Electronically Signed   By: Misty Stanley M.D.   On: 06/06/2021 12:57     Subjective: Patient was seen and examined at bedside.  Overnight events noted.   Patient reports feeling much improved.  Patient has tolerated soft bland diet, has bowel movement.   He wants to be discharged.  Patient  is being discharged home.  Discharge Exam: Vitals:   06/08/21 2008 06/09/21 0405  BP: 128/83 114/72  Pulse: 72 64  Resp:  20  Temp: 98.7 F (37.1 C) 98 F (36.7 C)  SpO2: 97% 94%   Vitals:   06/08/21 0534 06/08/21 1250 06/08/21 2008 06/09/21 0405  BP: 129/81 (!) 144/82 128/83 114/72  Pulse: 75 84 72 64  Resp: 20   20  Temp: 98.4 F (36.9 C) 97.9 F (36.6 C) 98.7 F (37.1 C) 98 F (36.7 C)  TempSrc: Oral Oral Axillary Oral  SpO2: 94% 97% 97% 94%  Height:        General: Pt is alert, awake, not in acute distress Cardiovascular: RRR, S1/S2 +, no rubs, no gallops Respiratory: CTA bilaterally, no wheezing, no rhonchi Abdominal: Soft, NT, ND, bowel sounds + Extremities: no edema, no cyanosis    The results of significant diagnostics from this hospitalization (including imaging, microbiology, ancillary and laboratory) are listed below for reference.     Microbiology: Recent Results (from the past 240 hour(s))  Resp Panel by RT-PCR (Flu A&B, Covid) Nasopharyngeal Swab     Status: None   Collection Time: 06/06/21  1:56 PM   Specimen: Nasopharyngeal Swab; Nasopharyngeal(NP) swabs in vial transport medium  Result Value Ref Range Status   SARS Coronavirus 2 by RT PCR NEGATIVE NEGATIVE Final    Comment: (NOTE) SARS-CoV-2 target nucleic acids are NOT DETECTED.  The SARS-CoV-2 RNA is generally detectable in upper respiratory specimens during the acute phase of infection. The lowest concentration of SARS-CoV-2 viral copies this assay can detect is 138 copies/mL. A negative  result does not preclude SARS-Cov-2 infection and should not be used as the sole basis for treatment or other patient management decisions. A negative result may occur with  improper specimen collection/handling, submission of specimen other than nasopharyngeal swab, presence of viral mutation(s) within the areas targeted by this assay, and inadequate number of viral copies(<138 copies/mL). A negative result must be combined with clinical observations, patient history, and epidemiological information. The expected result is Negative.  Fact Sheet for Patients:  EntrepreneurPulse.com.au  Fact Sheet for Healthcare Providers:  IncredibleEmployment.be  This test is no t yet approved or cleared by the [place] FDA and  has been authorized for detection and/or diagnosis of SARS-CoV-2 by FDA under an Emergency Use Authorization (EUA). This EUA will remain  in  effect (meaning this test can be used) for the duration of the COVID-19 declaration under Section 564(b)(1) of the Act, 21 U.S.C.section 360bbb-3(b)(1), unless the authorization is terminated  or revoked sooner.       Influenza A by PCR NEGATIVE NEGATIVE Final   Influenza B by PCR NEGATIVE NEGATIVE Final    Comment: (NOTE) The Xpert Xpress SARS-CoV-2/FLU/RSV plus assay is intended as an aid in the diagnosis of influenza from Nasopharyngeal swab specimens and should not be used as a sole basis for treatment. Nasal washings and aspirates are unacceptable for Xpert Xpress SARS-CoV-2/FLU/RSV testing.  Fact Sheet for Patients: EntrepreneurPulse.com.au  Fact Sheet for Healthcare Providers: IncredibleEmployment.be  This test is not yet approved or cleared by the [place] FDA and has been authorized for detection and/or diagnosis of SARS-CoV-2 by FDA under an Emergency Use Authorization (EUA). This EUA will remain in effect (meaning this test can be used)  for the duration of the COVID-19 declaration under Section 564(b)(1) of the Act, 21 U.S.C. section 360bbb-3(b)(1), unless the authorization is terminated or revoked.  Performed at [facility], [address] [place], [place] [address]      Labs: BNP (last 3 results) No results for input(s): BNP in the last 8760 hours. Basic Metabolic Panel: Recent Labs  Lab 06/06/21 1152 06/07/21 0603 06/08/21 0509  NA 138 139 141  K 3.9 3.8 3.6  CL 103 105 107  CO2 26 27 27   GLUCOSE 114* 127* 106*  BUN 15 18 20   CREATININE 0.82 0.78 0.75  CALCIUM 9.8 8.7* 8.6*  MG  --  2.5* 2.2  PHOS  --   --  2.9   Liver Function Tests: Recent Labs  Lab 06/06/21 1152  AST 27  ALT 41  ALKPHOS 46  BILITOT 0.7  PROT 8.6*  ALBUMIN 4.7   Recent Labs  Lab 06/06/21 1152  LIPASE 21   No results for input(s): AMMONIA in the last 168 hours. CBC: Recent Labs  Lab 06/06/21 1152 06/08/21 0509  WBC 10.6* 10.7*  NEUTROABS 8.3*  --   HGB 15.8 14.0  HCT 46.8 42.9  MCV 91.2 96.2  PLT 316 263   Cardiac Enzymes: No results for input(s): CKTOTAL, CKMB, CKMBINDEX, TROPONINI in the last 168 hours. BNP: Invalid input(s): POCBNP CBG: No results for input(s): GLUCAP in the last 168 hours. D-Dimer No results for input(s): DDIMER in the last 72 hours. Hgb A1c No results for input(s): HGBA1C in the last 72 hours. Lipid Profile No results for input(s): CHOL, HDL, LDLCALC, TRIG, CHOLHDL, LDLDIRECT in the last 72 hours. Thyroid function studies No results for input(s): TSH, T4TOTAL, T3FREE, THYROIDAB in the last 72 hours.  Invalid input(s): FREET3 Anemia work up No results for input(s): VITAMINB12, FOLATE, FERRITIN, TIBC, IRON, RETICCTPCT in the last 72 hours. Urinalysis    Component Value Date/Time   COLORURINE YELLOW 06/15/2019 1818   APPEARANCEUR HAZY (A) 06/15/2019 1818   LABSPEC >1.030 (H) 06/15/2019 1818   PHURINE 6.0 06/15/2019 1818   GLUCOSEU NEGATIVE 06/15/2019 1818    HGBUR NEGATIVE 06/15/2019 1818   BILIRUBINUR NEGATIVE 06/15/2019 1818   BILIRUBINUR NEG 04/16/2014 0905   KETONESUR 15 (A) 06/15/2019 1818   PROTEINUR NEGATIVE 06/15/2019 1818   UROBILINOGEN negative 04/16/2014 0905   UROBILINOGEN 1.0 07/17/2012 1933   NITRITE NEGATIVE 06/15/2019 1818   LEUKOCYTESUR NEGATIVE 06/15/2019 1818   Sepsis Labs Invalid input(s): PROCALCITONIN,  WBC,  LACTICIDVEN Microbiology Recent Results (from the past 240 hour(s))  Resp Panel by  RT-PCR (Flu A&B, Covid) Nasopharyngeal Swab     Status: None   Collection Time: 06/06/21  1:56 PM   Specimen: Nasopharyngeal Swab; Nasopharyngeal(NP) swabs in vial transport medium  Result Value Ref Range Status   SARS Coronavirus 2 by RT PCR NEGATIVE NEGATIVE Final    Comment: (NOTE) SARS-CoV-2 target nucleic acids are NOT DETECTED.  The SARS-CoV-2 RNA is generally detectable in upper respiratory specimens during the acute phase of infection. The lowest concentration of SARS-CoV-2 viral copies this assay can detect is 138 copies/mL. A negative result does not preclude SARS-Cov-2 infection and should not be used as the sole basis for treatment or other patient management decisions. A negative result may occur with  improper specimen collection/handling, submission of specimen other than nasopharyngeal swab, presence of viral mutation(s) within the areas targeted by this assay, and inadequate number of viral copies(<138 copies/mL). A negative result must be combined with clinical observations, patient history, and epidemiological information. The expected result is Negative.  Fact Sheet for Patients:  EntrepreneurPulse.com.au  Fact Sheet for Healthcare Providers:  IncredibleEmployment.be  This test is no t yet approved or cleared by the [place] FDA and  has been authorized for detection and/or diagnosis of SARS-CoV-2 by FDA under an Emergency Use Authorization (EUA). This EUA  will remain  in effect (meaning this test can be used) for the duration of the COVID-19 declaration under Section 564(b)(1) of the Act, 21 U.S.C.section 360bbb-3(b)(1), unless the authorization is terminated  or revoked sooner.       Influenza A by PCR NEGATIVE NEGATIVE Final   Influenza B by PCR NEGATIVE NEGATIVE Final    Comment: (NOTE) The Xpert Xpress SARS-CoV-2/FLU/RSV plus assay is intended as an aid in the diagnosis of influenza from Nasopharyngeal swab specimens and should not be used as a sole basis for treatment. Nasal washings and aspirates are unacceptable for Xpert Xpress SARS-CoV-2/FLU/RSV testing.  Fact Sheet for Patients: EntrepreneurPulse.com.au  Fact Sheet for Healthcare Providers: IncredibleEmployment.be  This test is not yet approved or cleared by the [place] FDA and has been authorized for detection and/or diagnosis of SARS-CoV-2 by FDA under an Emergency Use Authorization (EUA). This EUA will remain in effect (meaning this test can be used) for the duration of the COVID-19 declaration under Section 564(b)(1) of the Act, 21 U.S.C. section 360bbb-3(b)(1), unless the authorization is terminated or revoked.  Performed at [facility], [address] [place], [place] [address]      Time coordinating discharge: Over 30 minutes  SIGNED:   Shawna Clamp, MD  [facility] 06/09/2021, 10:59 AM Pager   If 7PM-7AM, please contact night-coverage

## 2021-06-09 NOTE — Discharge Instructions (Signed)
Advised to follow-up with primary care physician in 1 week. Patient feels much better.  Small bowel obstruction has resolved.

## 2021-06-09 NOTE — Progress Notes (Signed)
Progress Note     Subjective: Tolerated full liquids. Distension and abdominal pain remain improved. No nausea. Having bowel movements  Objective: Vital signs in last 24 hours: Temp:  [97.9 F (36.6 C)-98.7 F (37.1 C)] 98 F (36.7 C) (01/10 0405) Pulse Rate:  [64-84] 64 (01/10 0405) Resp:  [20] 20 (01/10 0405) BP: (114-144)/(72-83) 114/72 (01/10 0405) SpO2:  [94 %-97 %] 94 % (01/10 0405) Last BM Date: 06/08/21  Intake/Output from previous day: 01/09 0701 - 01/10 0700 In: 1420 [P.O.:1420] Out: -  Intake/Output this shift: No intake/output data recorded.  PE: General: pleasant, WD, male who is laying in bed in NAD HEENT: head is normocephalic, atraumatic. Mouth is pink and moist Heart: regular, rate, and rhythm. Palpable radial pulses bilaterally Lungs: CTAB. Respiratory effort nonlabored Abd: soft, mildly TTP without rebound or guarding over ventral hernia which is soft and reduces on exam. Very mildly distended MSK: all 4 extremities are symmetrical with no cyanosis, clubbing, or edema. Skin: warm and dry Psych: A&Ox3 with an appropriate affect.    Lab Results:  Recent Labs    06/06/21 1152 06/08/21 0509  WBC 10.6* 10.7*  HGB 15.8 14.0  HCT 46.8 42.9  PLT 316 263    BMET Recent Labs    06/07/21 0603 06/08/21 0509  NA 139 141  K 3.8 3.6  CL 105 107  CO2 27 27  GLUCOSE 127* 106*  BUN 18 20  CREATININE 0.78 0.75  CALCIUM 8.7* 8.6*    PT/INR No results for input(s): LABPROT, INR in the last 72 hours. CMP     Component Value Date/Time   NA 141 06/08/2021 0509   NA 139 10/31/2019 0942   K 3.6 06/08/2021 0509   CL 107 06/08/2021 0509   CO2 27 06/08/2021 0509   GLUCOSE 106 (H) 06/08/2021 0509   BUN 20 06/08/2021 0509   BUN 15 10/31/2019 0942   CREATININE 0.75 06/08/2021 0509   CALCIUM 8.6 (L) 06/08/2021 0509   PROT 8.6 (H) 06/06/2021 1152   PROT 7.7 08/23/2019 1158   ALBUMIN 4.7 06/06/2021 1152   ALBUMIN 4.6 08/23/2019 1158   AST 27  06/06/2021 1152   ALT 41 06/06/2021 1152   ALKPHOS 46 06/06/2021 1152   BILITOT 0.7 06/06/2021 1152   BILITOT 0.4 08/23/2019 1158   GFRNONAA >60 06/08/2021 0509   GFRAA 114 10/31/2019 0942   Lipase     Component Value Date/Time   LIPASE 21 06/06/2021 1152       Studies/Results: DG Abd Portable 1V-Small Bowel Obstruction Protocol-initial, 8 hr delay  Result Date: 06/07/2021 CLINICAL DATA:  Small bowel obstruction, 8 hour delay. EXAM: PORTABLE ABDOMEN - 1 VIEW COMPARISON:  Radiographs earlier today.  CT yesterday. FINDINGS: Enteric contrast is seen in the ascending, transverse, descending and rectosigmoid colon. Mild persisting gaseous distention of small bowel in the central abdomen. IMPRESSION: Enteric contrast throughout the colon. Mild persisting gaseous distention of small bowel in the central abdomen. Findings consistent with either resolving or partial small bowel obstruction. Electronically Signed   By: Narda Rutherford M.D.   On: 06/07/2021 21:37   DG Abd Portable 1V-Small Bowel Protocol-Position Verification  Result Date: 06/07/2021 CLINICAL DATA:  NG tube placement. EXAM: PORTABLE ABDOMEN - 1 VIEW COMPARISON:  06/06/2021 and older exams. FINDINGS: Nasal/orogastric tube passes below the diaphragm, tip in the left upper quadrant consistent with positioning in the gastric fundus. Residual contrast noted in the proximal stomach. Small-bowel dilation noted in the upper abdomen similar to  the previous day's study. IMPRESSION: 1. Well-positioned nasal/orogastric tube. 2. Persistent small dilation. Electronically Signed   By: Amie Portland M.D.   On: 06/07/2021 10:17    Anti-infectives: Anti-infectives (From admission, onward)    None        Assessment/Plan Recurrent SBO - CT scan 1/7 with Small-bowel obstruction extending from the mid jejunum into the mid to distal ileum without a single discrete transition point. The distal ileum and colon are decompressed. - h/o multple SBOs  and last operative intervention by Dr. Magnus Ivan in 2014  - abd xray 8 hr delay with contrast throughout colon -  having bowel function and tolerated full liquids. Advance to soft  Stable for discharge from general surgery perspective  FEN: soft diet ID: none indicated VTE: okay for chemical prophylaxis from surgical standpoint  low Medical Decision Making   LOS: 3 days   Eric Form, [facility] 06/09/2021, 9:02 AM Please see Amion for pager number during day hours 7:00am-4:30pm

## 2021-06-22 ENCOUNTER — Other Ambulatory Visit: Payer: Self-pay | Admitting: Cardiovascular Disease

## 2021-06-30 DIAGNOSIS — M25512 Pain in left shoulder: Secondary | ICD-10-CM | POA: Diagnosis not present

## 2021-06-30 DIAGNOSIS — N529 Male erectile dysfunction, unspecified: Secondary | ICD-10-CM | POA: Diagnosis not present

## 2021-06-30 DIAGNOSIS — E785 Hyperlipidemia, unspecified: Secondary | ICD-10-CM | POA: Diagnosis not present

## 2021-06-30 DIAGNOSIS — Z8719 Personal history of other diseases of the digestive system: Secondary | ICD-10-CM | POA: Diagnosis not present

## 2021-06-30 DIAGNOSIS — I7121 Aneurysm of the ascending aorta, without rupture: Secondary | ICD-10-CM | POA: Diagnosis not present

## 2021-06-30 DIAGNOSIS — G4733 Obstructive sleep apnea (adult) (pediatric): Secondary | ICD-10-CM | POA: Diagnosis not present

## 2021-07-06 DIAGNOSIS — N5201 Erectile dysfunction due to arterial insufficiency: Secondary | ICD-10-CM | POA: Diagnosis not present

## 2021-07-06 DIAGNOSIS — R3912 Poor urinary stream: Secondary | ICD-10-CM | POA: Diagnosis not present

## 2021-07-06 DIAGNOSIS — Z87442 Personal history of urinary calculi: Secondary | ICD-10-CM | POA: Diagnosis not present

## 2021-07-06 DIAGNOSIS — N401 Enlarged prostate with lower urinary tract symptoms: Secondary | ICD-10-CM | POA: Diagnosis not present

## 2021-07-15 DIAGNOSIS — R2232 Localized swelling, mass and lump, left upper limb: Secondary | ICD-10-CM | POA: Diagnosis not present

## 2021-07-15 DIAGNOSIS — M25511 Pain in right shoulder: Secondary | ICD-10-CM | POA: Diagnosis not present

## 2021-07-15 DIAGNOSIS — M25512 Pain in left shoulder: Secondary | ICD-10-CM | POA: Diagnosis not present

## 2021-07-22 DIAGNOSIS — I10 Essential (primary) hypertension: Secondary | ICD-10-CM | POA: Diagnosis not present

## 2021-07-22 DIAGNOSIS — H26491 Other secondary cataract, right eye: Secondary | ICD-10-CM | POA: Diagnosis not present

## 2021-07-30 DIAGNOSIS — Z961 Presence of intraocular lens: Secondary | ICD-10-CM | POA: Diagnosis not present

## 2021-08-03 ENCOUNTER — Other Ambulatory Visit: Payer: Self-pay | Admitting: Surgery

## 2021-08-03 DIAGNOSIS — I7121 Aneurysm of the ascending aorta, without rupture: Secondary | ICD-10-CM

## 2021-08-14 ENCOUNTER — Other Ambulatory Visit: Payer: Self-pay | Admitting: Sports Medicine

## 2021-08-14 DIAGNOSIS — R2232 Localized swelling, mass and lump, left upper limb: Secondary | ICD-10-CM

## 2021-08-14 DIAGNOSIS — M25511 Pain in right shoulder: Secondary | ICD-10-CM | POA: Diagnosis not present

## 2021-08-19 DIAGNOSIS — H26492 Other secondary cataract, left eye: Secondary | ICD-10-CM | POA: Diagnosis not present

## 2021-09-11 ENCOUNTER — Ambulatory Visit
Admission: RE | Admit: 2021-09-11 | Discharge: 2021-09-11 | Disposition: A | Discharge status: Home / Self Care | Payer: BC Managed Care – PPO | Source: Ambulatory Visit | Attending: Sports Medicine | Admitting: Sports Medicine

## 2021-09-11 DIAGNOSIS — Z9889 Other specified postprocedural states: Secondary | ICD-10-CM | POA: Diagnosis not present

## 2021-09-11 DIAGNOSIS — R2232 Localized swelling, mass and lump, left upper limb: Secondary | ICD-10-CM

## 2021-09-11 DIAGNOSIS — D1722 Benign lipomatous neoplasm of skin and subcutaneous tissue of left arm: Secondary | ICD-10-CM | POA: Diagnosis not present

## 2021-09-11 MED ORDER — GADOBENATE DIMEGLUMINE 529 MG/ML IV SOLN
20.0000 mL | Freq: Once | INTRAVENOUS | Status: AC | PRN
  Administered 2021-09-11: 20 mL via INTRAVENOUS

## 2021-09-14 ENCOUNTER — Other Ambulatory Visit: Payer: BC Managed Care – PPO

## 2021-09-23 ENCOUNTER — Ambulatory Visit: Payer: BC Managed Care – PPO | Admitting: Surgery

## 2021-09-23 ENCOUNTER — Encounter: Payer: Self-pay | Admitting: Surgery

## 2021-09-23 ENCOUNTER — Ambulatory Visit
Admission: RE | Admit: 2021-09-23 | Discharge: 2021-09-23 | Disposition: A | Discharge status: Home / Self Care | Payer: BC Managed Care – PPO | Source: Ambulatory Visit | Attending: Surgery | Admitting: Surgery

## 2021-09-23 VITALS — BP 112/70 | HR 60 | Resp 20 | Ht 68.0 in | Wt 229.0 lb

## 2021-09-23 DIAGNOSIS — I7121 Aneurysm of the ascending aorta, without rupture: Secondary | ICD-10-CM | POA: Diagnosis not present

## 2021-09-23 DIAGNOSIS — I712 Thoracic aortic aneurysm, without rupture, unspecified: Secondary | ICD-10-CM | POA: Diagnosis not present

## 2021-09-23 DIAGNOSIS — I700 Atherosclerosis of aorta: Secondary | ICD-10-CM | POA: Diagnosis not present

## 2021-09-23 DIAGNOSIS — Z9049 Acquired absence of other specified parts of digestive tract: Secondary | ICD-10-CM | POA: Diagnosis not present

## 2021-09-23 MED ORDER — IOPAMIDOL (ISOVUE-370) INJECTION 76%
75.0000 mL | Freq: Once | INTRAVENOUS | Status: AC | PRN
  Administered 2021-09-23: 75 mL via INTRAVENOUS

## 2021-09-23 NOTE — Progress Notes (Signed)
? ? ?HPI: ? ?The patient is a 61 year old gentleman with a functionally bicuspid aortic valve without stenosis or insufficiency who has a 4.5 to 4.7 cm fusiform ascending aortic aneurysm by CT and echocardiogram dating back to 04/2018 when it was found on a CT angio of the neck done for tinnitus.  He continues to feel well without any chest or back pain.  He denies any shortness of breath.  He was recently diagnosed with a large lipoma over the left lateral upper arm.  MRI of the humerus confirmed that this was a lipoma. ? ?Current Outpatient Medications  ?Medication Sig Dispense Refill  ? Acetaminophen 500 MG capsule Take 500-1,000 mg by mouth every 6 (six) hours as needed for pain (or headaches).    ? albuterol (VENTOLIN HFA) 108 (90 Base) MCG/ACT inhaler Inhale 2 puffs into the lungs every 6 (six) hours as needed for wheezing or shortness of breath.    ? ARTIFICIAL TEARS PF 0.1-0.3 % SOLN Place 1 drop into both eyes 3 (three) times daily as needed (for dryness).    ? aspirin EC 81 MG tablet Take 81 mg by mouth daily.    ? Cholecalciferol (VITAMIN D3) 50 MCG (2000 UT) TABS Take 2,000 Units by mouth daily.    ? losartan (COZAAR) 25 MG tablet Take 1 tablet (25 mg total) by mouth in the morning. 30 tablet 3  ? LUMIFY 0.025 % SOLN 1 drop See admin instructions. Instill 1 drop into affected eye(s) every six to eight hours as needed for redness    ? pantoprazole (PROTONIX) 20 MG tablet TAKE ONE (1) TABLET BY MOUTH EVERY DAY 60 tablet 1  ? pravastatin (PRAVACHOL) 20 MG tablet Take 0.5 tablets (10 mg total) by mouth daily. (Patient taking differently: Take 10 mg by mouth in the morning.) 45 tablet 3  ? tadalafil (CIALIS) 5 MG tablet Take 5 mg by mouth daily.    ? vitamin B-12 (CYANOCOBALAMIN) 500 MCG tablet Take 500-1,000 mcg by mouth daily.    ? vitamin C (ASCORBIC ACID) 500 MG tablet Take 500 mg by mouth daily.    ? zinc gluconate 50 MG tablet Take 50 mg by mouth daily.    ? ?No current facility-administered  medications for this visit.  ? ? ? ?Physical Exam: ?BP 112/70   Pulse 60   Resp 20   Ht 5\' 8"  (1.727 m)   Wt 229 lb (103.9 kg)   SpO2 94% Comment: RA  BMI 34.82 kg/m?  ?He looks well. ?Cardiac exam shows a regular rate and rhythm with normal heart sounds.  There is no murmur. ?Lungs are clear. ? ?Diagnostic Tests: ? ?Narrative & Impression  ?CLINICAL DATA:  Follow-up aortic aneurysm ?  ?EXAM: ?CT ANGIOGRAPHY CHEST WITH CONTRAST ?  ?TECHNIQUE: ?Multidetector CT imaging of the chest was performed using the ?standard protocol during bolus administration of intravenous ?contrast. Multiplanar CT image reconstructions and MIPs were ?obtained to evaluate the vascular anatomy. ?  ?RADIATION DOSE REDUCTION: This exam was performed according to the ?departmental dose-optimization program which includes automated ?exposure control, adjustment of the mA and/or kV according to ?patient size and/or use of iterative reconstruction technique. ?  ?CONTRAST:  59mL ISOVUE-370 IOPAMIDOL (ISOVUE-370) INJECTION 76% ?  ?COMPARISON:  06/06/2021, 09/10/2020 ?  ?FINDINGS: ?Cardiovascular: Preferential opacification of the thoracic aorta. ?Unchanged enlargement of the tubular ascending thoracic aorta ?measuring up to 4.5 x 4.5 cm. Aortic valve measures 2.5 cm. The ?sinuses of Valsalva measure up to 3.8 cm. Descending thoracic  aorta ?is normal in caliber measuring 2.5 x 2.4 cm. Normal heart size. No ?pericardial effusion. Minimal aortic atherosclerosis. ?  ?Mediastinum/Nodes: No enlarged mediastinal, hilar, or axillary lymph ?nodes. Thyroid gland, trachea, and esophagus demonstrate no ?significant findings. ?  ?Lungs/Pleura: Lungs are clear. No pleural effusion or pneumothorax. ?  ?Upper Abdomen: No acute abnormality.  Status post cholecystectomy ?  ?Musculoskeletal: No chest wall abnormality. No acute osseous ?findings. ?  ?Review of the MIP images confirms the above findings. ?  ?IMPRESSION: ?Unchanged enlargement of the tubular  ascending thoracic aorta ?measuring up to 4.5 x 4.5 cm. Ascending thoracic aortic aneurysm. ?Recommend semi-annual imaging followup by CTA or MRA and referral to ?cardiothoracic surgery if not already obtained. This recommendation ?follows 2010 ACCF/AHA/AATS/ACR/ASA/SCA/SCAI/SIR/STS/SVM Guidelines ?for the Diagnosis and Management of Patients With Thoracic Aortic ?Disease. Circulation. 2010; [id number]. Aortic aneurysm NOS ?(ICD10-I71.9). Minimal aortic atherosclerosis. ?  ?Aortic Atherosclerosis (ICD10-I70.0). ?  ?  ?Electronically Signed ?  By: Delanna Ahmadi M.D. ?  On: 09/23/2021 14:12 ?   ? ? ?Impression: ? ?This 61 year old gentleman has a bicuspid aortic valve is functioning normally associated with a 4.5 cm fusiform ascending aortic aneurysm that has been stable since it was diagnosed in 2019.  This is well below the surgical threshold of 5 to 5.5 cm.  I reviewed the CT images with the patient and his wife and answered their questions.  I stressed the importance of continued good blood pressure control in preventing further enlargement and acute aortic dissection.  I advised him against doing any heavy lifting that may require a Valsalva maneuver and could suddenly raise his blood pressure to high levels. ? ?Plan: ? ?I will see him back in 1 year with a CTA of the chest. ? ?I spent 20 minutes performing this established patient evaluation and > 50% of this time was spent face to face counseling and coordinating the care of this patient's bicuspid aortic valve and ascending aortic aneurysm.  ? ? ?Gaye Pollack, MD ?[facility] ?[phone] ? ? ? ? ? ?

## 2021-09-26 ENCOUNTER — Other Ambulatory Visit: Payer: Self-pay | Admitting: Cardiovascular Disease

## 2021-09-28 NOTE — Telephone Encounter (Signed)
Refill Request.  

## 2021-11-28 ENCOUNTER — Other Ambulatory Visit: Payer: Self-pay | Admitting: Cardiovascular Disease

## 2021-11-30 NOTE — Telephone Encounter (Signed)
Refill request

## 2022-02-26 DIAGNOSIS — Z961 Presence of intraocular lens: Secondary | ICD-10-CM | POA: Diagnosis not present

## 2022-04-09 ENCOUNTER — Telehealth: Payer: Self-pay | Admitting: Family Medicine

## 2022-04-09 NOTE — Telephone Encounter (Signed)
Patient would like to become an established patient at [facility]. He would prefer to see Dr Nadine Counts because of a friend that recommended her but if not, he wants to know if he can see Dr Dettinger because his wife is his patient. Please call back.

## 2022-04-12 NOTE — Telephone Encounter (Signed)
Patient would like to schedule with Dettinger for a new patient appointment. Offered a few days and times but he needs an early morning appointment, please call back.

## 2022-04-12 NOTE — Telephone Encounter (Signed)
I am not taking new pts at this time.

## 2022-04-12 NOTE — Telephone Encounter (Signed)
I am fine with taking him as a new patient, just let them know it may be scheduled out a little while before we get him in for his first appointment depending on my schedule.

## 2022-04-13 NOTE — Telephone Encounter (Signed)
Appointment made December 8th at 8:55 am

## 2022-04-26 NOTE — Telephone Encounter (Signed)
Patient unable to make this appt. Needs to r/s, please call back.

## 2022-04-26 NOTE — Telephone Encounter (Signed)
Spoke with wife, appointment scheduled with Dr. Louanne Skye on 06/24/22.

## 2022-05-07 ENCOUNTER — Ambulatory Visit: Payer: Self-pay | Admitting: Family Medicine

## 2022-05-10 NOTE — Progress Notes (Unsigned)
Cardiology Office Note   Date:  05/11/2022   ID:  Jonathan Burke, DOB [birth year], MRN [id number]  PCP:  Ileana Ladd, MD (Inactive)  Cardiologist:   Lorine Bears, MD   No chief complaint on file.      History of Present Illness: Jonathan Burke is a 61 y.o. male who presents for a follow-up visit regarding mild coronary atherosclerosis, bicuspid aortic valve and ascending aortic aneurysm. He has known history of essential hypertension.  There is family history of sudden death involving his father and brother. He had cardiac evaluation in February,2020 with CTA of the coronary arteries.  Coronary calcium score was 74.  There was mild nonobstructive LAD disease that was not significant by FFR.  There was incidental finding of ascending aortic aneurysm measured 4.5 cm.   The notes indicate that he underwent genetic testing for aortopathy which was negative.  Echocardiogram showed an EF was 50 to 55%.  Mild aortic valve calcifications were noted. The patient was placed on amlodipine but he had edema and then switched to atenolol and lisinopril.  Atenolol was discontinued due to low blood pressure and orthostatic dizziness.  Lisinopril was switched to losartan due to dry cough. He is not a smoker.  He did not tolerate rosuvastatin or atorvastatin due to severe myalgia.  He is currently on pravastatin.    A transesophageal echocardiogram was done in June 2021 which showed an EF of 60 to 65%, bicuspid aortic valve with fusion of the right and left coronary cusps with mild aortic valve calcifications without significant stenosis.  The ascending aorta measured 48 mm.  The patient has been following with Dr. Laneta Simmers on a yearly basis with stable size thoracic aortic aneurysm around 4.5 cm.  Lexiscan Myoview in July 2022 showed no evidence of ischemia with normal ejection fraction.  He has been doing well with no recent chest pain or worsening dyspnea.  He continues to struggle with weight  loss.  He takes his medications regularly.    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   Chest pain    Complication of anesthesia    states is hard to wake up   Dental crowns present    also caps   Dilated aortic root (HCC)    Enlarged prostate    GERD (gastroesophageal reflux disease)    daily OTC   H/O hiatal hernia    pt unsure if hernia or not, but has a knot in his stomach   Headache(784.0)    tension   PONV (postoperative nausea and vomiting)    Sleep apnea    pt non compliant with CPAP usage   Small bowel obstruction (HCC) 07/17/2012    Past Surgical History:  Procedure Laterality Date   APPENDECTOMY     CARPAL TUNNEL RELEASE  09/09/2011   Procedure: CARPAL TUNNEL RELEASE;  Surgeon: Wyn Forster., MD;  Location: [facility];  Service: Orthopedics;  Laterality: Left;   CHOLECYSTECTOMY     DIRECT LARYNGOSCOPY  07/07/2001   suspension microdirect laryngoscopy with exc. left vocal cord mass   ESOPHAGOGASTRODUODENOSCOPY N/A 06/05/2013   Procedure: ESOPHAGOGASTRODUODENOSCOPY (EGD);  Surgeon: Vertell Novak., MD;  Location: [facility]COPY;  Service: Endoscopy;  Laterality: N/A;   LAPAROSCOPIC LYSIS OF ADHESIONS N/A 05/29/2013   Procedure: DIAGNOSTIC LAPAROSCOPY CONVERTED TO EXPLORATORY LAPAROTOMY, LYSIS OF ADHESIONS ;  Surgeon: Harl Bowie, MD;  Location: [facility];  Service: General;  Laterality: N/A;   LUMBAR LAMINECTOMY/DECOMPRESSION MICRODISCECTOMY  10/29/1999   L5-S1   SHOULDER SURGERY     left   TEE WITHOUT CARDIOVERSION N/A 11/05/2019   Procedure: TRANSESOPHAGEAL ECHOCARDIOGRAM (TEE);  Surgeon: Wellington Hampshire, MD;  Location: [facility];  Service: Cardiovascular;  Laterality: N/A;     Current Outpatient Medications  Medication Sig Dispense Refill   Acetaminophen 500 MG capsule Take 500-1,000 mg by mouth every 6 (six) hours as needed for pain (or headaches).      albuterol (VENTOLIN HFA) 108 (90 Base) MCG/ACT inhaler Inhale 2 puffs into the lungs every 6 (six) hours as needed for wheezing or shortness of breath.     ARTIFICIAL TEARS PF 0.1-0.3 % SOLN Place 1 drop into both eyes 3 (three) times daily as needed (for dryness).     aspirin EC 81 MG tablet Take 81 mg by mouth daily.     Cholecalciferol (VITAMIN D3) 50 MCG (2000 UT) TABS Take 2,000 Units by mouth daily.     losartan (COZAAR) 25 MG tablet TAKE ONE (1) TABLET EACH DAY 90 tablet 3   LUMIFY 0.025 % SOLN 1 drop See admin instructions. Instill 1 drop into affected eye(s) every six to eight hours as needed for redness     pantoprazole (PROTONIX) 20 MG tablet TAKE ONE (1) TABLET BY MOUTH EVERY DAY 90 tablet 3   pravastatin (PRAVACHOL) 20 MG tablet Take 0.5 tablets (10 mg total) by mouth daily. (Patient taking differently: Take 10 mg by mouth in the morning.) 45 tablet 3   tadalafil (CIALIS) 5 MG tablet Take 5 mg by mouth daily.     vitamin B-12 (CYANOCOBALAMIN) 500 MCG tablet Take 500-1,000 mcg by mouth daily.     vitamin C (ASCORBIC ACID) 500 MG tablet Take 500 mg by mouth daily.     zinc gluconate 50 MG tablet Take 50 mg by mouth daily.     No current facility-administered medications for this visit.    Allergies:   Adhesive [tape] and Atorvastatin    Social History:  The patient  reports that he has never smoked. He has never used smokeless tobacco. He reports that he does not drink alcohol and does not use drugs.   Family History:  The patient's family history includes Anuerysm in his sister; Arthritis in his sister and sister; COPD in his mother; Heart attack in his father; Heart disease in his mother; Hypertension in his brother and sister; Rectal cancer in his mother; Stroke in his sister.    ROS:  Please see the history of present illness.   Otherwise, review of systems are positive for none.   All other systems are reviewed and negative.    PHYSICAL EXAM: VS:  BP 112/72 (BP  Location: Left Arm, Patient Position: Sitting, Cuff Size: Large)   Pulse 73   Ht 5\' 8"  (1.727 m)   Wt 246 lb 12.8 oz (111.9 kg)   SpO2 98%   BMI 37.53 kg/m  , BMI Body mass index is 37.53 kg/m. GEN: Well nourished, well developed, in no acute distress  HEENT: normal  Neck: no JVD, carotid bruits, or masses Cardiac: RRR; no murmurs, rubs, or gallops,no edema  Respiratory:  clear to auscultation bilaterally, normal work of breathing GI: soft, nontender, nondistended, + BS MS: no deformity or atrophy  Skin: warm and dry, no rash Neuro:  Strength and sensation are intact Psych: euthymic mood, full  affect   EKG:  EKG is ordered today. EKG showed normal sinus rhythm with no significant ST or T wave changes.   Recent Labs: 06/06/2021: ALT 41 06/08/2021: BUN 20; Creatinine, Ser 0.75; Hemoglobin 14.0; Magnesium 2.2; Platelets 263; Potassium 3.6; Sodium 141    Lipid Panel    Component Value Date/Time   CHOL 141 08/23/2019 1158   CHOL 155 09/05/2012 0925   TRIG 63 08/23/2019 1158   TRIG 68 09/05/2012 0925   HDL 39 (L) 08/23/2019 1158   HDL 43 09/05/2012 0925   CHOLHDL 3.6 08/23/2019 1158   LDLCALC 89 08/23/2019 1158   LDLCALC 98 09/05/2012 0925      Wt Readings from Last 3 Encounters:  05/11/22 246 lb 12.8 oz (111.9 kg)  09/23/21 229 lb (103.9 kg)  05/12/21 235 lb 9.6 oz (106.9 kg)           No data to display            ASSESSMENT AND PLAN:  1. Ascending aortic aneurysm: Most recent CTA showed stable size at 4.5 cm with plans to repeat CT in April of next year.  Continue to follow-up with Dr. Cyndia Bent.    Continue to avoid heavy lifting of more than 20 pounds and avoid fluoroquinolones antibiotics.    2. Essential hypertension: Blood pressures well controlled on losartan.  3. Hyperlipidemia: He did not tolerate atorvastatin or rosuvastatin in the past due to myalgia but he is tolerating pravastatin which will be continued.  Most recent lipid profile showed an LDL of  80.  I elected to increase pravastatin to 20 mg once daily to try to get his LDL below 70.  4.  Bicuspid aortic valve: This was not associated with significant regurgitation or stenosis.  No murmurs are noted by physical exam.  5.  Obesity: No contraindication for weight loss medications.    Disposition:   FU with me in 12 months  Signed,  Kathlyn Sacramento, MD  05/11/2022 8:32 AM    [place]

## 2022-05-11 ENCOUNTER — Ambulatory Visit: Payer: BC Managed Care – PPO | Attending: Cardiovascular Disease | Admitting: Cardiovascular Disease

## 2022-05-11 ENCOUNTER — Encounter: Payer: Self-pay | Admitting: Cardiovascular Disease

## 2022-05-11 VITALS — BP 112/72 | HR 73 | Ht 68.0 in | Wt 246.8 lb

## 2022-05-11 DIAGNOSIS — Q231 Congenital insufficiency of aortic valve: Secondary | ICD-10-CM

## 2022-05-11 DIAGNOSIS — E785 Hyperlipidemia, unspecified: Secondary | ICD-10-CM

## 2022-05-11 DIAGNOSIS — I10 Essential (primary) hypertension: Secondary | ICD-10-CM | POA: Diagnosis not present

## 2022-05-11 DIAGNOSIS — I7121 Aneurysm of the ascending aorta, without rupture: Secondary | ICD-10-CM | POA: Diagnosis not present

## 2022-05-11 MED ORDER — PRAVASTATIN SODIUM 20 MG PO TABS: 20.0000 mg | ORAL_TABLET | Freq: Every day | ORAL | 3 refills | Status: DC

## 2022-05-11 NOTE — Patient Instructions (Signed)
Medication Instructions:  INCREASE the Pravastatin to 20 mg once daily  *If you need a refill on your cardiac medications before your next appointment, please call your pharmacy*   Lab Work: None ordered If you have labs (blood work) drawn today and your tests are completely normal, you will receive your results only by: MyChart Message (if you have MyChart) OR A paper copy in the mail If you have any lab test that is abnormal or we need to change your treatment, we will call you to review the results.   Testing/Procedures: None ordered   Follow-Up: At [facility], you and your health needs are our priority.  As part of our continuing mission to provide you with exceptional heart care, we have created designated Provider Care Teams.  These Care Teams include your primary Cardiologist (physician) and Advanced Practice Providers (APPs -  Physician Assistants and Nurse Practitioners) who all work together to provide you with the care you need, when you need it.  We recommend signing up for the patient portal called "MyChart".  Sign up information is provided on this After Visit Summary.  MyChart is used to connect with patients for Virtual Visits (Telemedicine).  Patients are able to view lab/test results, encounter notes, upcoming appointments, etc.  Non-urgent messages can be sent to your provider as well.   To learn more about what you can do with MyChart, go to [email or web address].    Your next appointment:   12 month(s)  The format for your next appointment:   In Person  Provider:   Lorine Bears, MD

## 2022-06-24 ENCOUNTER — Telehealth: Payer: Self-pay | Admitting: *Deleted

## 2022-06-24 ENCOUNTER — Telehealth: Payer: Self-pay | Admitting: Family Medicine

## 2022-06-24 ENCOUNTER — Encounter: Payer: Self-pay | Admitting: Family Medicine

## 2022-06-24 ENCOUNTER — Other Ambulatory Visit: Payer: Self-pay | Admitting: Family Medicine

## 2022-06-24 ENCOUNTER — Ambulatory Visit: Payer: BC Managed Care – PPO | Admitting: Family Medicine

## 2022-06-24 VITALS — BP 132/74 | HR 74 | Ht 68.0 in | Wt 249.0 lb

## 2022-06-24 DIAGNOSIS — I7121 Aneurysm of the ascending aorta, without rupture: Secondary | ICD-10-CM

## 2022-06-24 DIAGNOSIS — I10 Essential (primary) hypertension: Secondary | ICD-10-CM | POA: Diagnosis not present

## 2022-06-24 DIAGNOSIS — Z23 Encounter for immunization: Secondary | ICD-10-CM

## 2022-06-24 DIAGNOSIS — E785 Hyperlipidemia, unspecified: Secondary | ICD-10-CM

## 2022-06-24 DIAGNOSIS — G4733 Obstructive sleep apnea (adult) (pediatric): Secondary | ICD-10-CM

## 2022-06-24 DIAGNOSIS — R350 Frequency of micturition: Secondary | ICD-10-CM

## 2022-06-24 DIAGNOSIS — N401 Enlarged prostate with lower urinary tract symptoms: Secondary | ICD-10-CM

## 2022-06-24 DIAGNOSIS — E669 Obesity, unspecified: Secondary | ICD-10-CM

## 2022-06-24 DIAGNOSIS — I2510 Atherosclerotic heart disease of native coronary artery without angina pectoris: Secondary | ICD-10-CM | POA: Diagnosis not present

## 2022-06-24 LAB — LIPID PANEL

## 2022-06-24 MED ORDER — TIRZEPATIDE 12.5 MG/0.5ML ~~LOC~~ SOAJ: 12.5000 mg | SUBCUTANEOUS | 0 refills | Status: DC

## 2022-06-24 MED ORDER — SEMAGLUTIDE-WEIGHT MANAGEMENT 0.5 MG/0.5ML ~~LOC~~ SOAJ: 0.5000 mg | SUBCUTANEOUS | 0 refills | Status: DC

## 2022-06-24 MED ORDER — SEMAGLUTIDE-WEIGHT MANAGEMENT 1.7 MG/0.75ML ~~LOC~~ SOAJ: 1.7000 mg | SUBCUTANEOUS | 0 refills | Status: DC

## 2022-06-24 MED ORDER — TIRZEPATIDE 7.5 MG/0.5ML ~~LOC~~ SOAJ: 7.5000 mg | SUBCUTANEOUS | 0 refills | Status: DC

## 2022-06-24 MED ORDER — SEMAGLUTIDE-WEIGHT MANAGEMENT 0.25 MG/0.5ML ~~LOC~~ SOAJ: 0.2500 mg | SUBCUTANEOUS | 0 refills | Status: DC

## 2022-06-24 MED ORDER — SEMAGLUTIDE-WEIGHT MANAGEMENT 1 MG/0.5ML ~~LOC~~ SOAJ: 1.0000 mg | SUBCUTANEOUS | 0 refills | Status: DC

## 2022-06-24 MED ORDER — TIRZEPATIDE 5 MG/0.5ML ~~LOC~~ SOAJ: 5.0000 mg | SUBCUTANEOUS | 0 refills | Status: DC

## 2022-06-24 MED ORDER — TIRZEPATIDE 10 MG/0.5ML ~~LOC~~ SOAJ: 10.0000 mg | SUBCUTANEOUS | 0 refills | Status: DC

## 2022-06-24 MED ORDER — SEMAGLUTIDE-WEIGHT MANAGEMENT 2.4 MG/0.75ML ~~LOC~~ SOAJ: 2.4000 mg | SUBCUTANEOUS | 0 refills | Status: DC

## 2022-06-24 MED ORDER — TIRZEPATIDE 2.5 MG/0.5ML ~~LOC~~ SOAJ: 2.5000 mg | SUBCUTANEOUS | 0 refills | Status: DC

## 2022-06-24 MED ORDER — TIRZEPATIDE 15 MG/0.5ML ~~LOC~~ SOAJ: 15.0000 mg | SUBCUTANEOUS | 0 refills | Status: DC

## 2022-06-24 NOTE — Progress Notes (Unsigned)
Patient is going to cash pay so the [facility] will be cheaper, he is going to use it for weight loss.

## 2022-06-24 NOTE — Progress Notes (Signed)
BP 132/74   Pulse 74   Ht 5\' 8"  (1.727 m)   Wt 249 lb (112.9 kg)   SpO2 97%   BMI 37.86 kg/m    Subjective:    Patient ID: Jonathan Burke, male    DOB: Dec 25, 1960, 62 y.o.   MRN: [id number]  HPI: Jonathan Burke is a 62 y.o. male presenting on 06/24/2022 for Medical Management of Chronic Issues (CPE) and Obesity (Wants medication to help)   HPI Hypertension Patient is currently on losartan, and their blood pressure today is 132/74. Patient denies any lightheadedness or dizziness. Patient denies headaches, blurred vision, chest pains, shortness of breath, or weakness. Denies any side effects from medication and is content with current medication.   Hyperlipidemia Patient is coming in for recheck of his hyperlipidemia. The patient is currently taking pravastatin. They deny any issues with myalgias or history of liver damage from it. They deny any focal numbness or weakness or chest pain.   GERD Patient is currently on pantoprazole.  She denies any major symptoms or abdominal pain or belching or burping. She denies any blood in her stool or lightheadedness or dizziness.   Ascending aortic aneurysm and bicuspid aortic valve Sees cardiovascular for these things.  It looks like his last aneurysm scan was 4.5.  Sleep apnea Patient uses his sleep apnea machine 85 percent of the night Patient uses a sleep apnea machine 15 days of the month Patient feels like his CPAP doesn't make a significant difference in his sleep and energy. He has this for 3 years.  Patient is on CPAP settings autopap.   Patient would like to try weight loss and would like to try the [facility] or the Ozempic help with weight loss.  He is very good things about it.  He understands that his insurance may not pay for it.  Relevant past medical, surgical, family and social history reviewed and updated as indicated. Interim medical history since our last visit reviewed. Allergies and medications reviewed and  updated.  Review of Systems  Constitutional:  Negative for chills and fever.  Eyes:  Negative for visual disturbance.  Respiratory:  Negative for shortness of breath and wheezing.   Cardiovascular:  Negative for chest pain and leg swelling.  Musculoskeletal:  Negative for back pain and gait problem.  Skin:  Negative for rash.  Neurological:  Negative for dizziness, weakness and light-headedness.  All other systems reviewed and are negative.   Per HPI unless specifically indicated above  Social History   Socioeconomic History   Marital status: Married    Spouse name: Not on file   Number of children: 2   Years of education: Not on file   Highest education level: Not on file  Occupational History   Not on file  Tobacco Use   Smoking status: Never   Smokeless tobacco: Never  Vaping Use   Vaping Use: Never used  Substance and Sexual Activity   Alcohol use: No   Drug use: No   Sexual activity: Yes    Partners: Female  Other Topics Concern   Not on file  Social History Narrative   Not on file   Social Determinants of Health   Financial Resource Strain: Not on file  Food Insecurity: Not on file  Transportation Needs: Not on file  Physical Activity: Not on file  Stress: Not on file  Social Connections: Not on file  Intimate Partner Violence: Not on file    Past Surgical History:  Procedure Laterality Date   APPENDECTOMY     CARPAL TUNNEL RELEASE  09/09/2011   Procedure: CARPAL TUNNEL RELEASE;  Surgeon: Wyn Forster., MD;  Location: [facility];  Service: Orthopedics;  Laterality: Left;   CHOLECYSTECTOMY     DIRECT LARYNGOSCOPY  07/07/2001   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 ENDOSCOPY;  Service: Endoscopy;  Laterality: N/A;   LAPAROSCOPIC LYSIS OF ADHESIONS N/A 05/29/2013    Procedure: DIAGNOSTIC LAPAROSCOPY CONVERTED TO EXPLORATORY LAPAROTOMY, LYSIS OF ADHESIONS ;  Surgeon: Shelly Rubenstein, MD;  Location: [facility] OR;  Service: General;  Laterality: N/A;   LUMBAR LAMINECTOMY/DECOMPRESSION MICRODISCECTOMY  10/29/1999   L5-S1   REPLACEMENT TOTAL KNEE Right 2020   SHOULDER SURGERY     left   TEE WITHOUT CARDIOVERSION N/A 11/05/2019   Procedure: TRANSESOPHAGEAL ECHOCARDIOGRAM (TEE);  Surgeon: Iran Ouch, MD;  Location: [facility] ORS;  Service: Cardiovascular;  Laterality: N/A;    Family History  Problem Relation Age of Onset   COPD Mother    Heart disease Mother    Rectal cancer Mother    Heart attack Father    Arthritis Sister    Hypertension Brother    Hypertension Sister    Anuerysm Sister    Stroke Sister    Arthritis Sister        RA    Allergies as of 06/24/2022       Reactions   Adhesive [tape] Other (See Comments)   Regular tape pulls OFF the skin!! Only paper tape is tolerated.   Atorvastatin Other (See Comments)   Made the patient's bones hurt        Medication List        Accurate as of June 24, 2022  9:05 AM. If you have any questions, ask your nurse or doctor.          STOP taking these medications    albuterol 108 (90 Base) MCG/ACT inhaler Commonly known as: VENTOLIN HFA Stopped by: Elige Radon Alvy Alsop, MD   tadalafil 5 MG tablet Commonly known as: CIALIS Stopped by: Elige Radon Eira Alpert, MD       TAKE these medications    Acetaminophen 500 MG capsule Take 500-1,000 mg by mouth every 6 (six) hours as needed for pain (or headaches).   Artificial Tears PF 0.1-0.3 % Soln Generic drug: Dextran 70-Hypromellose (PF) Place 1 drop into both eyes 3 (three) times daily as needed (for dryness).   ascorbic acid 500 MG tablet Commonly known as: VITAMIN C Take 500 mg by mouth daily.   aspirin EC 81 MG tablet Take 81 mg by mouth daily.   cyanocobalamin 500 MCG tablet Commonly known as: VITAMIN B12 Take 500-1,000 mcg  by mouth daily.   losartan 25 MG tablet Commonly known as: COZAAR TAKE ONE (1) TABLET EACH DAY   Lumify 0.025 % Soln Generic drug: Brimonidine Tartrate 1 drop See admin instructions. Instill 1 drop into affected eye(s) every six to eight hours as needed for redness   pantoprazole 20 MG tablet Commonly known as: PROTONIX TAKE ONE (1) TABLET BY MOUTH EVERY DAY   pravastatin 20 MG tablet Commonly known as: PRAVACHOL Take 1 tablet (20 mg total) by mouth daily.   Semaglutide-Weight Management 0.25 MG/0.5ML Soaj Inject 0.25 mg into the skin once a week for 28 days. Started by: Nils Pyle, MD  Semaglutide-Weight Management 0.5 MG/0.5ML Soaj Inject 0.5 mg into the skin once a week for 28 days. Start taking on: July 23, 2022 Started by: Elige Radon Rashea Hoskie, MD   Semaglutide-Weight Management 1 MG/0.5ML Soaj Inject 1 mg into the skin once a week for 28 days. Start taking on: August 21, 2022 Started by: Elige Radon Malyk Girouard, MD   Semaglutide-Weight Management 1.7 MG/0.75ML Soaj Inject 1.7 mg into the skin once a week for 28 days. Start taking on: September 19, 2022 Started by: Elige Radon Maddelynn Moosman, MD   Semaglutide-Weight Management 2.4 MG/0.75ML Soaj Inject 2.4 mg into the skin once a week for 28 days. Start taking on: Oct 18, 2022 Started by: Nils Pyle, MD   Vitamin D3 50 MCG (2000 UT) Tabs Take 2,000 Units by mouth daily.   zinc gluconate 50 MG tablet Take 50 mg by mouth daily.           Objective:    BP 132/74   Pulse 74   Ht 5\' 8"  (1.727 m)   Wt 249 lb (112.9 kg)   SpO2 97%   BMI 37.86 kg/m   Wt Readings from Last 3 Encounters:  06/24/22 249 lb (112.9 kg)  05/11/22 246 lb 12.8 oz (111.9 kg)  09/23/21 229 lb (103.9 kg)    Physical Exam Vitals and nursing note reviewed.  Constitutional:      General: He is not in acute distress.    Appearance: He is well-developed. He is not diaphoretic.  Eyes:     General: No scleral icterus.     Conjunctiva/sclera: Conjunctivae normal.  Neck:     Thyroid: No thyromegaly.  Cardiovascular:     Rate and Rhythm: Normal rate and regular rhythm.     Heart sounds: Normal heart sounds. No murmur heard. Pulmonary:     Effort: Pulmonary effort is normal. No respiratory distress.     Breath sounds: Normal breath sounds. No wheezing.  Musculoskeletal:        General: Normal range of motion.     Cervical back: Neck supple.  Lymphadenopathy:     Cervical: No cervical adenopathy.  Skin:    General: Skin is warm and dry.     Findings: No rash.  Neurological:     Mental Status: He is alert and oriented to person, place, and time.     Coordination: Coordination normal.  Psychiatric:        Behavior: Behavior normal.         Assessment & Plan:   Problem List Items Addressed This Visit       Cardiovascular and Mediastinum   Essential hypertension - Primary   Relevant Orders   CBC with Differential/Platelet   CMP14+EGFR   Lipid panel   Coronary artery disease involving native coronary artery of native heart without angina pectoris   Relevant Orders   CBC with Differential/Platelet   CMP14+EGFR   Lipid panel   Thoracic aortic aneurysm without rupture (HCC)     Respiratory   OSA (obstructive sleep apnea)     Genitourinary   BPH (benign prostatic hyperplasia)     Other   Dyslipidemia (high LDL; low HDL)   Relevant Orders   CMP14+EGFR   Lipid panel   Morbid obesity (HCC)   Relevant Medications   Semaglutide-Weight Management 0.25 MG/0.5ML SOAJ   Semaglutide-Weight Management 0.5 MG/0.5ML SOAJ (Start on 07/23/2022)   Semaglutide-Weight Management 1 MG/0.5ML SOAJ (Start on 08/21/2022)   Semaglutide-Weight Management 1.7 MG/0.75ML SOAJ (Start on 09/19/2022)  Semaglutide-Weight Management 2.4 MG/0.75ML SOAJ (Start on 10/18/2022)   Other Visit Diagnoses     Need for Tdap vaccination       Relevant Orders   Tdap vaccine greater than or equal to 7yo IM       Continue  current medicine, blood pressure looks good.  Patient wants to try weight loss medicine and will try that.  Will discuss sleep apnea in the future but may need another referral to see a specialist to discuss other options because he is not currently doing well with his current mask. Follow up plan: Return in about 3 months (around 09/23/2022), or if symptoms worsen or fail to improve, for Weight loss recheck.  Caryl Pina, MD [facility] 06/24/2022, 9:05 AM

## 2022-06-24 NOTE — Telephone Encounter (Signed)
Jonathan Burke (Key: [id number]) Rx #: [id number] 0.25MG /0.5ML auto-injectors   Sent to Plan

## 2022-06-24 NOTE — Telephone Encounter (Signed)
Patient does not currently have diabetes that we know of.  He would be more looking at for weight loss.  ZEPbound is the same thing is Mounjaro but just the weight loss brand, it is the same medicine as Mounjaro, it can be very good for weight loss, I do not know that it would be covered by his insurance but he can contact them and see if it would be covered for weight loss or he can ask the pharmacy what it would cost for weight loss

## 2022-06-25 ENCOUNTER — Telehealth: Payer: Self-pay | Admitting: *Deleted

## 2022-06-25 ENCOUNTER — Telehealth: Payer: Self-pay | Admitting: Family Medicine

## 2022-06-25 LAB — CBC WITH DIFFERENTIAL/PLATELET
Basophils Absolute: 0 10*3/uL (ref 0.0–0.2)
Basos: 1 %
EOS (ABSOLUTE): 0.1 10*3/uL (ref 0.0–0.4)
Eos: 2 %
Hematocrit: 45.2 % (ref 37.5–51.0)
Hemoglobin: 15.1 g/dL (ref 13.0–17.7)
Immature Grans (Abs): 0 10*3/uL (ref 0.0–0.1)
Immature Granulocytes: 0 %
Lymphocytes Absolute: 1.3 10*3/uL (ref 0.7–3.1)
Lymphs: 21 %
MCH: 30.4 pg (ref 26.6–33.0)
MCHC: 33.4 g/dL (ref 31.5–35.7)
MCV: 91 fL (ref 79–97)
Monocytes Absolute: 0.8 10*3/uL (ref 0.1–0.9)
Monocytes: 12 %
Neutrophils Absolute: 4 10*3/uL (ref 1.4–7.0)
Neutrophils: 64 %
Platelets: 278 10*3/uL (ref 150–450)
RBC: 4.97 x10E6/uL (ref 4.14–5.80)
RDW: 11.9 % (ref 11.6–15.4)
WBC: 6.2 10*3/uL (ref 3.4–10.8)

## 2022-06-25 LAB — LIPID PANEL
Chol/HDL Ratio: 2.9 ratio (ref 0.0–5.0)
Cholesterol, Total: 126 mg/dL (ref 100–199)
HDL: 43 mg/dL (ref >39)
LDL Chol Calc (NIH): 68 mg/dL (ref 0–99)
Triglycerides: 71 mg/dL (ref 0–149)
VLDL Cholesterol Cal: 15 mg/dL (ref 5–40)

## 2022-06-25 LAB — CMP14+EGFR
ALT: 52 IU/L — ABNORMAL HIGH (ref 0–44)
AST: 37 IU/L (ref 0–40)
Albumin/Globulin Ratio: 1.6 (ref 1.2–2.2)
Albumin: 4.7 g/dL (ref 3.9–4.9)
Alkaline Phosphatase: 48 IU/L (ref 44–121)
BUN/Creatinine Ratio: 20 (ref 10–24)
BUN: 18 mg/dL (ref 8–27)
Bilirubin Total: 0.4 mg/dL (ref 0.0–1.2)
CO2: 21 mmol/L (ref 20–29)
Calcium: 9.5 mg/dL (ref 8.6–10.2)
Chloride: 105 mmol/L (ref 96–106)
Creatinine, Ser: 0.89 mg/dL (ref 0.76–1.27)
Globulin, Total: 2.9 g/dL (ref 1.5–4.5)
Glucose: 102 mg/dL — ABNORMAL HIGH (ref 70–99)
Potassium: 4.8 mmol/L (ref 3.5–5.2)
Sodium: 142 mmol/L (ref 134–144)
Total Protein: 7.6 g/dL (ref 6.0–8.5)
eGFR: 97 mL/min/{1.73_m2} (ref >59)

## 2022-06-25 MED ORDER — ZEPBOUND 2.5 MG/0.5ML ~~LOC~~ SOAJ: 2.5000 mg | SUBCUTANEOUS | 0 refills | Status: DC

## 2022-06-25 MED ORDER — ZEPBOUND 5 MG/0.5ML ~~LOC~~ SOAJ: 5.0000 mg | SUBCUTANEOUS | 0 refills | Status: DC

## 2022-06-25 MED ORDER — ZEPBOUND 7.5 MG/0.5ML ~~LOC~~ SOAJ: 7.5000 mg | SUBCUTANEOUS | 0 refills | Status: DC

## 2022-06-25 MED ORDER — ZEPBOUND 10 MG/0.5ML ~~LOC~~ SOAJ: 10.0000 mg | SUBCUTANEOUS | 0 refills | Status: DC

## 2022-06-25 NOTE — Telephone Encounter (Signed)
Patient aware and states he would like zepbound called into the drug store in [place] so see the self pay price since insurance does not cover.  Please send in

## 2022-06-25 NOTE — Telephone Encounter (Signed)
Patient aware.

## 2022-06-25 NOTE — Telephone Encounter (Signed)
Patient aware that PA's have been submitted.  He downloaded the coupon and paid the cash price.

## 2022-06-25 NOTE — Telephone Encounter (Signed)
I sent that down for the patient

## 2022-06-25 NOTE — Addendum Note (Signed)
Addended by: Caryl Pina on: 06/25/2022 12:12 PM   Modules accepted: Orders

## 2022-06-25 NOTE — Telephone Encounter (Signed)
Rolin Dowland (Key: [id number]) Need Help? Call us at [phone]  Status: Sent to Plan   Drug: Zepbound 2.5MG /0.5ML pen-injectors

## 2022-06-30 ENCOUNTER — Other Ambulatory Visit (HOSPITAL_COMMUNITY): Payer: Self-pay

## 2022-06-30 NOTE — Telephone Encounter (Signed)
Okay sounds good, I believe he already has a [place] prescription from before, have him contact the pharmacy and make sure is still there, if not we can send a new one for him.

## 2022-06-30 NOTE — Telephone Encounter (Signed)
Patient aware and verbalizes understanding. 

## 2022-06-30 NOTE — Telephone Encounter (Signed)
Pharmacy Patient Advocate Encounter  Received notification from [facility] that the request for prior authorization for Zepbound 2.5MG /0.5ML pen-injectors has been denied. Did not give reason for denial in CMM, but may be because patient does not have a clinical intolerance or contraindication to [facility] and [employer].   Key: [id number]

## 2022-06-30 NOTE — Telephone Encounter (Signed)
Patient Advocate Encounter  Prior Authorization for [employer] 0.25MG /0.5ML auto-injectors has been approved.    Key: [id number] Effective dates: 06/24/22 through 10/27/22

## 2022-07-08 ENCOUNTER — Telehealth: Payer: Self-pay | Admitting: Family Medicine

## 2022-07-08 MED ORDER — SEMAGLUTIDE-WEIGHT MANAGEMENT 1 MG/0.5ML ~~LOC~~ SOAJ: 1.0000 mg | SUBCUTANEOUS | 0 refills | Status: AC

## 2022-07-08 MED ORDER — SEMAGLUTIDE-WEIGHT MANAGEMENT 0.5 MG/0.5ML ~~LOC~~ SOAJ: 0.5000 mg | SUBCUTANEOUS | 0 refills | Status: AC

## 2022-07-08 MED ORDER — SEMAGLUTIDE-WEIGHT MANAGEMENT 0.25 MG/0.5ML ~~LOC~~ SOAJ: 0.2500 mg | SUBCUTANEOUS | 0 refills | Status: AC

## 2022-07-08 MED ORDER — SEMAGLUTIDE-WEIGHT MANAGEMENT 2.4 MG/0.75ML ~~LOC~~ SOAJ: 2.4000 mg | SUBCUTANEOUS | 0 refills | Status: DC

## 2022-07-08 MED ORDER — SEMAGLUTIDE-WEIGHT MANAGEMENT 1.7 MG/0.75ML ~~LOC~~ SOAJ: 1.7000 mg | SUBCUTANEOUS | 0 refills | Status: AC

## 2022-07-08 NOTE — Telephone Encounter (Signed)
Sent [facility] prescription

## 2022-07-08 NOTE — Telephone Encounter (Signed)
Pt's wife made aware. 

## 2022-07-21 DIAGNOSIS — R3912 Poor urinary stream: Secondary | ICD-10-CM | POA: Diagnosis not present

## 2022-07-21 DIAGNOSIS — N401 Enlarged prostate with lower urinary tract symptoms: Secondary | ICD-10-CM | POA: Diagnosis not present

## 2022-07-21 DIAGNOSIS — N5201 Erectile dysfunction due to arterial insufficiency: Secondary | ICD-10-CM | POA: Diagnosis not present

## 2022-08-09 ENCOUNTER — Other Ambulatory Visit: Payer: Self-pay | Admitting: Surgery

## 2022-08-09 DIAGNOSIS — I7121 Aneurysm of the ascending aorta, without rupture: Secondary | ICD-10-CM

## 2022-08-16 ENCOUNTER — Emergency Department (HOSPITAL_BASED_OUTPATIENT_CLINIC_OR_DEPARTMENT_OTHER)
Admission: EM | Admit: 2022-08-16 | Discharge: 2022-08-16 | Disposition: A | Discharge status: Home / Self Care | Payer: BC Managed Care – PPO | Attending: Emergency Medicine | Admitting: Emergency Medicine

## 2022-08-16 ENCOUNTER — Encounter (HOSPITAL_BASED_OUTPATIENT_CLINIC_OR_DEPARTMENT_OTHER): Payer: Self-pay

## 2022-08-16 ENCOUNTER — Other Ambulatory Visit: Payer: Self-pay

## 2022-08-16 ENCOUNTER — Emergency Department (HOSPITAL_BASED_OUTPATIENT_CLINIC_OR_DEPARTMENT_OTHER): Payer: BC Managed Care – PPO | Admitting: Radiology

## 2022-08-16 ENCOUNTER — Emergency Department (HOSPITAL_BASED_OUTPATIENT_CLINIC_OR_DEPARTMENT_OTHER): Payer: BC Managed Care – PPO

## 2022-08-16 DIAGNOSIS — R112 Nausea with vomiting, unspecified: Secondary | ICD-10-CM | POA: Diagnosis not present

## 2022-08-16 DIAGNOSIS — K439 Ventral hernia without obstruction or gangrene: Secondary | ICD-10-CM | POA: Insufficient documentation

## 2022-08-16 DIAGNOSIS — Z7982 Long term (current) use of aspirin: Secondary | ICD-10-CM | POA: Diagnosis not present

## 2022-08-16 DIAGNOSIS — R109 Unspecified abdominal pain: Secondary | ICD-10-CM | POA: Diagnosis not present

## 2022-08-16 DIAGNOSIS — K529 Noninfective gastroenteritis and colitis, unspecified: Secondary | ICD-10-CM | POA: Insufficient documentation

## 2022-08-16 DIAGNOSIS — R0789 Other chest pain: Secondary | ICD-10-CM | POA: Diagnosis not present

## 2022-08-16 DIAGNOSIS — R1010 Upper abdominal pain, unspecified: Secondary | ICD-10-CM | POA: Diagnosis not present

## 2022-08-16 DIAGNOSIS — R079 Chest pain, unspecified: Secondary | ICD-10-CM | POA: Diagnosis not present

## 2022-08-16 DIAGNOSIS — I712 Thoracic aortic aneurysm, without rupture, unspecified: Secondary | ICD-10-CM | POA: Diagnosis not present

## 2022-08-16 LAB — URINALYSIS, ROUTINE W REFLEX MICROSCOPIC
Bacteria, UA: NONE SEEN
Glucose, UA: NEGATIVE mg/dL
Ketones, ur: 40 mg/dL — AB
Leukocytes,Ua: NEGATIVE
Nitrite: NEGATIVE
Protein, ur: 30 mg/dL — AB
Specific Gravity, Urine: 1.031 — ABNORMAL HIGH (ref 1.005–1.030)
pH: 5.5 (ref 5.0–8.0)

## 2022-08-16 LAB — CBC
HCT: 47.5 % (ref 39.0–52.0)
Hemoglobin: 16.5 g/dL (ref 13.0–17.0)
MCH: 31.6 pg (ref 26.0–34.0)
MCHC: 34.7 g/dL (ref 30.0–36.0)
MCV: 91 fL (ref 80.0–100.0)
Platelets: 329 10*3/uL (ref 150–400)
RBC: 5.22 MIL/uL (ref 4.22–5.81)
RDW: 12.8 % (ref 11.5–15.5)
WBC: 11.6 10*3/uL — ABNORMAL HIGH (ref 4.0–10.5)
nRBC: 0 % (ref 0.0–0.2)

## 2022-08-16 LAB — TROPONIN I (HIGH SENSITIVITY)
Troponin I (High Sensitivity): <2 ng/L (ref <18)
Troponin I (High Sensitivity): 2 ng/L (ref <18)

## 2022-08-16 LAB — COMPREHENSIVE METABOLIC PANEL
ALT: 35 U/L (ref 0–44)
AST: 24 U/L (ref 15–41)
Albumin: 4.9 g/dL (ref 3.5–5.0)
Alkaline Phosphatase: 48 U/L (ref 38–126)
Anion gap: 12 (ref 5–15)
BUN: 17 mg/dL (ref 8–23)
CO2: 22 mmol/L (ref 22–32)
Calcium: 10.1 mg/dL (ref 8.9–10.3)
Chloride: 106 mmol/L (ref 98–111)
Creatinine, Ser: 0.83 mg/dL (ref 0.61–1.24)
GFR, Estimated: >60 mL/min (ref >60)
Glucose, Bld: 114 mg/dL — ABNORMAL HIGH (ref 70–99)
Potassium: 3.9 mmol/L (ref 3.5–5.1)
Sodium: 140 mmol/L (ref 135–145)
Total Bilirubin: 0.7 mg/dL (ref 0.3–1.2)
Total Protein: 8.3 g/dL — ABNORMAL HIGH (ref 6.5–8.1)

## 2022-08-16 LAB — LIPASE, BLOOD: Lipase: 11 U/L (ref 11–51)

## 2022-08-16 MED ORDER — HYOSCYAMINE SULFATE 0.125 MG SL SUBL
0.2500 mg | SUBLINGUAL_TABLET | Freq: Once | SUBLINGUAL | Status: AC
  Administered 2022-08-16: 0.25 mg via SUBLINGUAL
  Filled 2022-08-16: qty 2

## 2022-08-16 MED ORDER — ONDANSETRON HCL 4 MG/2ML IJ SOLN
4.0000 mg | Freq: Once | INTRAMUSCULAR | Status: AC
  Administered 2022-08-16: 4 mg via INTRAVENOUS
  Filled 2022-08-16: qty 2

## 2022-08-16 MED ORDER — ALUM & MAG HYDROXIDE-SIMETH 200-200-20 MG/5ML PO SUSP
30.0000 mL | Freq: Once | ORAL | Status: AC
  Administered 2022-08-16: 30 mL via ORAL
  Filled 2022-08-16: qty 30

## 2022-08-16 MED ORDER — HYDROCODONE-ACETAMINOPHEN 5-325 MG PO TABS: 1.0000 | ORAL_TABLET | Freq: Four times a day (QID) | ORAL | 0 refills | Status: DC | PRN

## 2022-08-16 MED ORDER — SUCRALFATE 1 G PO TABS: 1.0000 g | ORAL_TABLET | Freq: Three times a day (TID) | ORAL | 1 refills | Status: DC

## 2022-08-16 MED ORDER — PANTOPRAZOLE SODIUM 20 MG PO TBEC: 20.0000 mg | DELAYED_RELEASE_TABLET | Freq: Two times a day (BID) | ORAL | 2 refills | Status: DC

## 2022-08-16 MED ORDER — HYOSCYAMINE SULFATE SL 0.125 MG SL SUBL: 0.1250 mg | SUBLINGUAL_TABLET | SUBLINGUAL | 1 refills | Status: DC | PRN

## 2022-08-16 MED ORDER — SODIUM CHLORIDE 0.9 % IV BOLUS
500.0000 mL | Freq: Once | INTRAVENOUS | Status: AC
  Administered 2022-08-16: 500 mL via INTRAVENOUS

## 2022-08-16 MED ORDER — HYDROMORPHONE HCL 1 MG/ML IJ SOLN
0.5000 mg | Freq: Once | INTRAMUSCULAR | Status: AC
  Administered 2022-08-16: 0.5 mg via INTRAVENOUS
  Filled 2022-08-16: qty 1

## 2022-08-16 MED ORDER — LIDOCAINE VISCOUS HCL 2 % MT SOLN
15.0000 mL | Freq: Once | OROMUCOSAL | Status: AC
  Administered 2022-08-16: 15 mL via ORAL
  Filled 2022-08-16: qty 15

## 2022-08-16 MED ORDER — IOHEXOL 350 MG/ML SOLN
100.0000 mL | Freq: Once | INTRAVENOUS | Status: AC | PRN
  Administered 2022-08-16: 100 mL via INTRAVENOUS

## 2022-08-16 MED ORDER — ONDANSETRON 4 MG PO TBDP: 4.0000 mg | ORAL_TABLET | Freq: Three times a day (TID) | ORAL | 0 refills | Status: DC | PRN

## 2022-08-16 MED ORDER — MORPHINE SULFATE (PF) 4 MG/ML IV SOLN
4.0000 mg | Freq: Once | INTRAVENOUS | Status: AC
  Administered 2022-08-16: 4 mg via INTRAVENOUS
  Filled 2022-08-16: qty 1

## 2022-08-16 MED ORDER — PANTOPRAZOLE SODIUM 40 MG IV SOLR
40.0000 mg | Freq: Once | INTRAVENOUS | Status: AC
  Administered 2022-08-16: 40 mg via INTRAVENOUS
  Filled 2022-08-16: qty 10

## 2022-08-16 NOTE — Discharge Instructions (Addendum)
Contact a health care provider if you: Cannot keep fluids down. Have symptoms that get worse. Have new symptoms. Feel light-headed or dizzy. Have muscle cramps. Get help right away if you: Have chest pain. Have trouble breathing or you are breathing very quickly. Have a fast heartbeat. Feel extremely weak or you faint. Have a severe headache, a stiff neck, or both. Have a rash. Have severe pain, cramping, or bloating in your abdomen. Have skin that feels cold and clammy. Feel confused. Have pain when you urinate. Have signs of dehydration, such as: Dark urine, very little urine, or no urine. Cracked lips. Dry mouth. Sunken eyes. Sleepiness. Weakness. Have signs of bleeding, such as: Seeing blood in your vomit. Having vomit that looks like coffee grounds. Having bloody or black stools or stools that look like tar. These symptoms may be an emergency. Get help right away. Call 911. Do not wait to see if the symptoms will go away. Do not drive yourself to the hospital.

## 2022-08-16 NOTE — ED Notes (Signed)
Reviewed AVS with patient, patient expressed understanding of directions, denies further questions at this time. 

## 2022-08-16 NOTE — ED Provider Notes (Signed)
[facility] Provider Note   CSN: [id number] Arrival date & time: 08/16/22  1705     History  Chief Complaint  Patient presents with   Abdominal Pain    Jonathan Burke is a 62 y.o. male who presents with abd and diarrhea x4 days. Hx of recurrent bowek obstructions and aortic aneurysm.  He has had multiple episodes of watery diarrhea and then 1 episode of green-colored emesis today pain is located predominantly in the upper quadrants of his abdomen.  He also has some lower chest pain.  Patient describes the pain as cramping, achy and constant.  He is not taking any medication for pain or nausea.   Abdominal Pain      Home Medications Prior to Admission medications   Medication Sig Start Date End Date Taking? Authorizing Provider  Acetaminophen 500 MG capsule Take 500-1,000 mg by mouth every 6 (six) hours as needed for pain (or headaches).    [provider]  ARTIFICIAL TEARS PF 0.1-0.3 % SOLN Place 1 drop into both eyes 3 (three) times daily as needed (for dryness).    [provider]  aspirin EC 81 MG tablet Take 81 mg by mouth daily.    [provider]  Cholecalciferol (VITAMIN D3) 50 MCG (2000 UT) TABS Take 2,000 Units by mouth daily.    [provider]  losartan (COZAAR) 25 MG tablet TAKE ONE (1) TABLET EACH DAY 11/30/21   Wellington Hampshire, MD  LUMIFY 0.025 % SOLN 1 drop See admin instructions. Instill 1 drop into affected eye(s) every six to eight hours as needed for redness    [provider]  pantoprazole (PROTONIX) 20 MG tablet TAKE ONE (1) TABLET BY MOUTH EVERY DAY 09/28/21   Wellington Hampshire, MD  pravastatin (PRAVACHOL) 20 MG tablet Take 1 tablet (20 mg total) by mouth daily. 05/11/22   Wellington Hampshire, MD  Semaglutide-Weight Management 0.5 MG/0.5ML SOAJ Inject 0.5 mg into the skin once a week for 28 days. 08/06/22 09/03/22  Dettinger, Fransisca Kaufmann, MD  Semaglutide-Weight Management 1 MG/0.5ML  SOAJ Inject 1 mg into the skin once a week for 28 days. 09/04/22 10/02/22  Dettinger, Fransisca Kaufmann, MD  Semaglutide-Weight Management 1.7 MG/0.75ML SOAJ Inject 1.7 mg into the skin once a week for 28 days. 10/03/22 10/31/22  Dettinger, Fransisca Kaufmann, MD  Semaglutide-Weight Management 2.4 MG/0.75ML SOAJ Inject 2.4 mg into the skin once a week for 28 days. 11/01/22 11/29/22  Dettinger, Fransisca Kaufmann, MD  vitamin B-12 (CYANOCOBALAMIN) 500 MCG tablet Take 500-1,000 mcg by mouth daily.    [provider]  vitamin C (ASCORBIC ACID) 500 MG tablet Take 500 mg by mouth daily.    [provider]  zinc gluconate 50 MG tablet Take 50 mg by mouth daily.    [provider]      Allergies    Adhesive [tape] and Atorvastatin    Review of Systems   Review of Systems  Gastrointestinal:  Positive for abdominal pain.    Physical Exam Updated Vital Signs BP 123/87   Pulse 96   Temp 97.9 F (36.6 C)   Resp 18   Ht 5\' 8"  (1.727 m)   Wt 98.9 kg   SpO2 98%   BMI 33.15 kg/m  Physical Exam Vitals and nursing note reviewed.  Constitutional:      General: He is not in acute distress.    Appearance: He is well-developed. He is not diaphoretic.  HENT:     Head: Normocephalic and atraumatic.  Eyes:     General: No scleral icterus.    Conjunctiva/sclera: Conjunctivae normal.  Cardiovascular:     Rate and Rhythm: Normal rate and regular rhythm.     Heart sounds: Normal heart sounds.  Pulmonary:     Effort: Pulmonary effort is normal. No respiratory distress.     Breath sounds: Normal breath sounds.  Abdominal:     Palpations: Abdomen is soft.     Tenderness: There is abdominal tenderness in the epigastric area.     Hernia: A hernia is present. Hernia is present in the ventral area (soft).  Musculoskeletal:     Cervical back: Normal range of motion and neck supple.  Skin:    General: Skin is warm and dry.  Neurological:     Mental Status: He is alert.  Psychiatric:        Behavior: Behavior  normal.     ED Results / Procedures / Treatments   Labs (all labs ordered are listed, but only abnormal results are displayed) Labs Reviewed  CBC - Abnormal; Notable for the following components:      Result Value   WBC 11.6 (*)    All other components within normal limits  URINALYSIS, ROUTINE W REFLEX MICROSCOPIC - Abnormal; Notable for the following components:   APPearance HAZY (*)    Specific Gravity, Urine 1.031 (*)    Hgb urine dipstick SMALL (*)    Bilirubin Urine SMALL (*)    Ketones, ur 40 (*)    Protein, ur 30 (*)    All other components within normal limits  LIPASE, BLOOD  COMPREHENSIVE METABOLIC PANEL  TROPONIN I (HIGH SENSITIVITY)    EKG EKG Interpretation  Date/Time:  Monday August 16 2022 17:11:28 EDT Ventricular Rate:  100 PR Interval:  158 QRS Duration: 72 QT Interval:  326 QTC Calculation: 420 R Axis:   34 Text Interpretation: Normal sinus rhythm Cannot rule out Anterior infarct , age undetermined Abnormal ECG When compared with ECG of 21-May-2015 20:19, Nonspecific T wave abnormality now evident in Inferior leads Confirmed by Aletta Edouard [phone]) on 08/16/2022 5:24:51 PM  Radiology DG Chest 2 View  Result Date: 08/16/2022 CLINICAL DATA:  Chest pain. The patient also reports generalized abdominal pain, nausea, and vomiting. History of small-bowel obstruction x4 and this feels similar. EXAM: CHEST - 2 VIEW COMPARISON:  Chest radiographs 06/06/2021 and 05/21/2015 FINDINGS: Cardiac silhouette and mediastinal contours are within normal limits. The lungs are clear. No pleural effusion or pneumothorax. No acute skeletal abnormality. Right upper quadrant likely cholecystectomy clips. There is an air-fluid level seen within the stomach. Upper abdominal small bowel loops are distended up to 4 cm and there are multiple air-fluid levels. IMPRESSION: 1. No acute pulmonary process. 2. Upper abdominal small bowel loops are distended up to 4 cm and there are multiple  air-fluid levels. Findings are concerning for small bowel obstruction. Recommend clinical correlation. Dedicated supine and upright abdominal radiographs may help further evaluate. Electronically Signed   By: Yvonne Kendall M.D.   On: 08/16/2022 17:37    Procedures Procedures    Medications Ordered in ED Medications - No data to display  ED Course/ Medical Decision Making/ A&P Clinical Course as of 08/16/22 2356  Mon Aug 16, 2022  1953 Patient unit at bedside.  He does admit that he has been on Wegovy and wonders if potentially his vomiting is due to this.  Patient also states he has a  history of reflux and erosive esophagitis.  He has been having pretty severe belching and abdominal pain and discomfort especially at night.  He states that he usually does not eat much during the day gets around 10 PM and usually has a big meal.  He is only able to sleep in his recliner because lying back makes him get bad reflux symptoms.  Patient's nausea has improved but belly pain is still present and worsening after some relief with initial dose of morphine.  I have added on Levsin, Protonix, viscous lidocaine and Maalox.  Will reevaluate. [AH]  1954 Comprehensive metabolic panel(!) [AH]  [id number] Glucose(!): 114 [AH]  1954 Lipase, blood [AH]  1954 Troponin I (High Sensitivity) [AH]  1954 Urinalysis, Routine w reflex microscopic -Urine, Clean Catch(!) [AH]  1954 CBC(!) I have evaluated patient's labs, no significant abnormality [AH]  1954 CT Angio Chest/Abd/Pel for Dissection W and/or Wo Contrast I visualized and interpreted CT angiogram of the chest abdomen and pelvis.  No significant change in patient's known aortic aneurysm.  He has some dilated loops of bowel which appear to show early ileus versus enteritis. I discussed all findings with the patient at bedside. [AH]    Clinical Course User Index [AH] Margarita Mail, PA-C                             Medical Decision Making Based on the patient's  epigastric pain, differential includes but is not limited to cholelithiasis, cholecystitis, hepatitis, GERD, PUD, pancreatitis, splenic infarct. Less likely nephrolithiasis or pyelonephritis with no CVAT and no urinary symptoms. Less likely pulmonary source such as pneumonia with no cardiopulmonary complaints, no hypoxia, and no increased work of breathing. Less likely atypical ACS with no risk factors and no cardiopulmonary complaints.    Patient's labs reviewed troponins negative x 2, lipase within normal limits, CMP without significant abnormality, CBC with mildly elevated white blood cell count, UA with small amount of hemoglobin.  I ordered visualized and interpreted imaging including CT angiogram of the abdomen and pelvis as discussed in ED course.  I also visualized and interpreted two-view chest x-ray which shows some abdominal small bowel loops with distention, this is also visualized on CT scan.  Patient admits that he is also been taking Wegovy and that he has been experiencing worsening gastric reflux.  He states that he has decreased his energy drink use but has increased his coffee use.  He does have a history of erosive esophagitis.  Patient given multiple medications including fluids, Zofran, morphine, Dilaudid, Levsin, Protonix, and GI cocktail with Maalox and viscous lidocaine.  Patient also given IV fluids.  On repeat evaluation patient has no abdominal pain.  He was able to hold down 16 ounces of water without nausea or vomiting.  I feel like his symptoms are multifactorial and could include early ileus although patient has been passing gas, making watery bowel movements and belching.  I also think this could be related to PUD/gastritis.  I have discussed dietary modifications, given orders for medications to take at home.  Close GI follow-up, return precautions discussed.  PDMP reviewed during this encounter.    Amount and/or Complexity of Data Reviewed Labs: ordered.  Decision-making details documented in ED Course. Radiology: ordered and independent interpretation performed. Decision-making details documented in ED Course.  Risk OTC drugs. Prescription drug management.           Final Clinical Impression(s) / ED Diagnoses Final diagnoses:  None    Rx / DC Orders ED Discharge Orders     None         Margarita Mail, PA-C 08/17/22 0001    Hayden Rasmussen, MD 08/17/22 1006

## 2022-08-16 NOTE — ED Triage Notes (Signed)
Patient here POV from Home.  Endorses Generalized ABD Pain that began. Also notes Lower Chest Pain. Pain began Friday and worsened since.  Some N/V. Diarrhea yesterday. No Dysuria. No Fever.   NAD noted during triage. A&Ox4. GCS 15. Ambulatory.

## 2022-08-17 ENCOUNTER — Telehealth: Payer: Self-pay

## 2022-08-17 NOTE — Transitions of Care (Post Inpatient/ED Visit) (Signed)
   08/17/2022  Name: Jonathan Burke MRN: [id number] DOB: 11/13/1960  Today's TOC FU Call Status: Today's TOC FU Call Status:: Successful TOC FU Call Competed TOC FU Call Complete Date: 08/17/22  Transition Care Management Follow-up Telephone Call Date of Discharge: 08/16/22 Discharge Facility: [facility] ([facility]) Type of Discharge: Emergency Department Reason for ED Visit: Other: (nausea vomiting) How have you been since you were released from the hospital?: Better Any questions or concerns?: No  Items Reviewed: Did you receive and understand the discharge instructions provided?: Yes Medications obtained and verified?: Yes (Medications Reviewed) Any new allergies since your discharge?: No Dietary orders reviewed?: Yes Do you have support at home?: Yes People in Home: spouse  Home Care and Equipment/Supplies: [place] Ordered?: NA Any new equipment or medical supplies ordered?: NA  Functional Questionnaire: Do you need assistance with bathing/showering or dressing?: No Do you need assistance with meal preparation?: No Do you need assistance with eating?: No Do you have difficulty maintaining continence: No Do you need assistance with getting out of bed/getting out of a chair/moving?: No Do you have difficulty managing or taking your medications?: No  Follow up appointments reviewed: PCP Follow-up appointment confirmed?: [facility] Follow-up appointment confirmed?: NA Do you need transportation to your follow-up appointment?: No Do you understand care options if your condition(s) worsen?: Yes-patient verbalized understanding    [place], [place] Direct Dial [phone]

## 2022-09-09 ENCOUNTER — Encounter: Payer: Self-pay | Admitting: Surgery

## 2022-09-23 ENCOUNTER — Ambulatory Visit: Payer: BC Managed Care – PPO | Admitting: Family Medicine

## 2022-09-23 ENCOUNTER — Encounter: Payer: Self-pay | Admitting: Family Medicine

## 2022-09-23 VITALS — BP 122/72 | HR 79 | Ht 68.0 in | Wt 211.0 lb

## 2022-09-23 DIAGNOSIS — Z6832 Body mass index (BMI) 32.0-32.9, adult: Secondary | ICD-10-CM | POA: Diagnosis not present

## 2022-09-23 DIAGNOSIS — E669 Obesity, unspecified: Secondary | ICD-10-CM

## 2022-09-23 DIAGNOSIS — I2510 Atherosclerotic heart disease of native coronary artery without angina pectoris: Secondary | ICD-10-CM

## 2022-09-23 MED ORDER — ZEPBOUND 5 MG/0.5ML ~~LOC~~ SOAJ: 5.0000 mg | SUBCUTANEOUS | 3 refills | Status: DC

## 2022-09-23 NOTE — Progress Notes (Signed)
BP 122/72   Pulse 79   Ht  (1.727 m)   Wt 211 lb (95.7 kg)   SpO2 97%   BMI 32.08 kg/m    Subjective:   Patient ID: Jonathan Burke, male    DOB: Mar 31, 1961, 62 y.o.   MRN: [id number]  HPI: Jonathan Burke is a 62 y.o. male presenting on 09/23/2022 for Medical Management of Chronic Issues and Weight Check (249lb at last check)   HPI Obesity and weight recheck Patient is coming in today for obesity and weight recheck.  He is on the [facility] although he does start with but then his insurance made him come to the [facility].  On the Wegovy he is currently taking 1 mg weekly and feels like it is helping except for he does get a lot of bowel issues and constipation and feels like he gets blocked and backed up.  He is really focusing on changing his diet and being more active and changing eating habits and snacking and reducing fatty and greasy foods.  Relevant past medical, surgical, family and social history reviewed and updated as indicated. Interim medical history since our last visit reviewed. Allergies and medications reviewed and updated.  Review of Systems  Constitutional:  Negative for chills and fever.  Eyes:  Negative for visual disturbance.  Respiratory:  Negative for shortness of breath and wheezing.   Cardiovascular:  Negative for chest pain and leg swelling.  Musculoskeletal:  Negative for back pain and gait problem.  Skin:  Negative for rash.  All other systems reviewed and are negative.   Per HPI unless specifically indicated above   Allergies as of 09/23/2022       Reactions   Adhesive [tape] Other (See Comments)   Regular tape pulls OFF the skin!! Only paper tape is tolerated.   Atorvastatin Other (See Comments)   Made the patient's bones hurt        Medication List        Accurate as of September 23, 2022  9:47 AM. If you have any questions, ask your nurse or doctor.          Acetaminophen 500 MG capsule Take 500-1,000 mg by mouth every 6 (six) hours as  needed for pain (or headaches).   Artificial Tears PF 0.1-0.3 % Soln Generic drug: Dextran 70-Hypromellose (PF) Place 1 drop into both eyes 3 (three) times daily as needed (for dryness).   ascorbic acid 500 MG tablet Commonly known as: VITAMIN C Take 500 mg by mouth daily.   aspirin EC 81 MG tablet Take 81 mg by mouth daily.   cyanocobalamin 500 MCG tablet Commonly known as: VITAMIN B12 Take 500-1,000 mcg by mouth daily.   HYDROcodone-acetaminophen 5-325 MG tablet Commonly known as: Norco Take 1-2 tablets by mouth every 6 (six) hours as needed for severe pain.   Hyoscyamine Sulfate SL 0.125 MG Subl Commonly known as: Levsin/SL Place 1 tablet (0.125 mg total) under the tongue every 4 (four) hours as needed (stomach pain and cramping). Up to 1.25 mg daily   losartan 25 MG tablet Commonly known as: COZAAR TAKE ONE (1) TABLET EACH DAY   Lumify 0.025 % Soln Generic drug: Brimonidine Tartrate 1 drop See admin instructions. Instill 1 drop into affected eye(s) every six to eight hours as needed for redness   ondansetron 4 MG disintegrating tablet Commonly known as: ZOFRAN-ODT Take 1 tablet (4 mg total) by mouth every 8 (eight) hours as needed for nausea or vomiting.  pantoprazole 20 MG tablet Commonly known as: PROTONIX TAKE ONE (1) TABLET BY MOUTH EVERY DAY   pantoprazole 20 MG tablet Commonly known as: PROTONIX Take 1 tablet (20 mg total) by mouth 2 (two) times daily.   pravastatin 20 MG tablet Commonly known as: PRAVACHOL Take 1 tablet (20 mg total) by mouth daily.   Semaglutide-Weight Management 1 MG/0.5ML Soaj Inject 1 mg into the skin once a week for 28 days.   Semaglutide-Weight Management 1.7 MG/0.75ML Soaj Inject 1.7 mg into the skin once a week for 28 days. Start taking on: Oct 03, 2022   Semaglutide-Weight Management 2.4 MG/0.75ML Soaj Inject 2.4 mg into the skin once a week for 28 days. Start taking on: November 01, 2022   sucralfate 1 g tablet Commonly  known as: Carafate Take 1 tablet (1 g total) by mouth 4 (four) times daily -  with meals and at bedtime.   Vitamin D3 50 MCG (2000 UT) Tabs Take 2,000 Units by mouth daily.   Zepbound 5 MG/0.5ML Pen Generic drug: tirzepatide Inject 5 mg into the skin once a week. Started by: Elige Radon Tinamarie Przybylski, MD   zinc gluconate 50 MG tablet Take 50 mg by mouth daily.         Objective:   BP 122/72   Pulse 79   Ht 5\' 8"  (1.727 m)   Wt 211 lb (95.7 kg)   SpO2 97%   BMI 32.08 kg/m   Wt Readings from Last 3 Encounters:  09/23/22 211 lb (95.7 kg)  08/16/22 218 lb (98.9 kg)  06/24/22 249 lb (112.9 kg)    Physical Exam Vitals and nursing note reviewed.  Constitutional:      General: He is not in acute distress.    Appearance: He is well-developed. He is not diaphoretic.  Eyes:     General: No scleral icterus.    Conjunctiva/sclera: Conjunctivae normal.  Neck:     Thyroid: No thyromegaly.  Cardiovascular:     Rate and Rhythm: Normal rate and regular rhythm.     Heart sounds: Normal heart sounds. No murmur heard. Pulmonary:     Effort: Pulmonary effort is normal. No respiratory distress.     Breath sounds: Normal breath sounds. No wheezing.  Skin:    General: Skin is warm and dry.     Findings: No rash.  Neurological:     Mental Status: He is alert and oriented to person, place, and time.     Coordination: Coordination normal.  Psychiatric:        Behavior: Behavior normal.       Assessment & Plan:   Problem List Items Addressed This Visit       Cardiovascular and Mediastinum   Coronary artery disease involving native coronary artery of native heart without angina pectoris   Relevant Medications   tirzepatide (ZEPBOUND) 5 MG/0.5ML Pen     Other   Obesity (BMI 30-39.9) - Primary   Relevant Medications   tirzepatide (ZEPBOUND) 5 MG/0.5ML Pen   Morbid obesity   Relevant Medications   tirzepatide (ZEPBOUND) 5 MG/0.5ML Pen    Patient has not really been watching  his diet.  He is down 31 pounds over the past couple months.  He says his only issue with the [facility] is that he gets a lot of constipation and on the trial that he had of his abdomen he felt like he did better.  He would like to try to go back to the stepdown if possible.  His BMI is down to 32 now.  Will try present again to see if it is better with his constipation issues if is not covered he will stay on the [facility] and go up to the next dose. Follow up plan: Return if symptoms worsen or fail to improve, for 2 months weight and obesity recheck.  Counseling provided for all of the vaccine components No orders of the defined types were placed in this encounter.   Arville Care, MD [facility] 09/23/2022, 9:47 AM

## 2022-09-28 ENCOUNTER — Telehealth: Payer: Self-pay

## 2022-09-28 NOTE — Telephone Encounter (Signed)
(  Jonathan Burke)  Rx #: [id number] Need Help? Call us at [phone]  Status sent iconSent to Plan today Drug  Zepbound 5MG /0.5ML pen-injectors ePA cloud logo Form [employer] [place] Commercial Electronic Request Form Original Claim Info 75

## 2022-09-29 ENCOUNTER — Ambulatory Visit
Admission: RE | Admit: 2022-09-29 | Discharge: 2022-09-29 | Disposition: A | Discharge status: Home / Self Care | Payer: BC Managed Care – PPO | Source: Ambulatory Visit | Attending: Surgery | Admitting: Surgery

## 2022-09-29 ENCOUNTER — Ambulatory Visit: Payer: BC Managed Care – PPO | Admitting: Surgery

## 2022-09-29 VITALS — BP 116/77 | HR 77 | Resp 20 | Ht 68.0 in | Wt 209.0 lb

## 2022-09-29 DIAGNOSIS — I712 Thoracic aortic aneurysm, without rupture, unspecified: Secondary | ICD-10-CM | POA: Diagnosis not present

## 2022-09-29 DIAGNOSIS — I7121 Aneurysm of the ascending aorta, without rupture: Secondary | ICD-10-CM

## 2022-09-29 MED ORDER — IOPAMIDOL (ISOVUE-370) INJECTION 76%
75.0000 mL | Freq: Once | INTRAVENOUS | Status: AC | PRN
  Administered 2022-09-29: 75 mL via INTRAVENOUS

## 2022-10-02 ENCOUNTER — Encounter: Payer: Self-pay | Admitting: Surgery

## 2022-10-02 NOTE — Progress Notes (Signed)
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  When I saw him last year it measured 4.5 cm. He continues to feel well without any chest or back pain.  He denies any shortness of breath.   Current Outpatient Medications  Medication Sig Dispense Refill   Acetaminophen 500 MG capsule Take 500-1,000 mg by mouth every 6 (six) hours as needed for pain (or headaches).     ARTIFICIAL TEARS PF 0.1-0.3 % SOLN Place 1 drop into both eyes 3 (three) times daily as needed (for dryness).     aspirin EC 81 MG tablet Take 81 mg by mouth daily.     Cholecalciferol (VITAMIN D3) 50 MCG (2000 UT) TABS Take 2,000 Units by mouth daily.     HYDROcodone-acetaminophen (NORCO) 5-325 MG tablet Take 1-2 tablets by mouth every 6 (six) hours as needed for severe pain. 10 tablet 0   Hyoscyamine Sulfate SL (LEVSIN/SL) 0.125 MG SUBL Place 1 tablet (0.125 mg total) under the tongue every 4 (four) hours as needed (stomach pain and cramping). Up to 1.25 mg daily 30 tablet 1   losartan (COZAAR) 25 MG tablet TAKE ONE (1) TABLET EACH DAY 90 tablet 3   ondansetron (ZOFRAN-ODT) 4 MG disintegrating tablet Take 1 tablet (4 mg total) by mouth every 8 (eight) hours as needed for nausea or vomiting. 10 tablet 0   pantoprazole (PROTONIX) 20 MG tablet TAKE ONE (1) TABLET BY MOUTH EVERY DAY 90 tablet 3   pantoprazole (PROTONIX) 20 MG tablet Take 1 tablet (20 mg total) by mouth 2 (two) times daily. 60 tablet 2   pravastatin (PRAVACHOL) 20 MG tablet Take 1 tablet (20 mg total) by mouth daily. 90 tablet 3   Semaglutide-Weight Management 1 MG/0.5ML SOAJ Inject 1 mg into the skin once a week for 28 days. 2 mL 0   [START ON 10/03/2022] Semaglutide-Weight Management 1.7 MG/0.75ML SOAJ Inject 1.7 mg into the  skin once a week for 28 days. 3 mL 0   [START ON 11/01/2022] Semaglutide-Weight Management 2.4 MG/0.75ML SOAJ Inject 2.4 mg into the skin once a week for 28 days. 3 mL 0   sucralfate (CARAFATE) 1 g tablet Take 1 tablet (1 g total) by mouth 4 (four) times daily -  with meals and at bedtime. 120 tablet 1   tirzepatide (ZEPBOUND) 5 MG/0.5ML Pen Inject 5 mg into the skin once a week. 6 mL 3   vitamin B-12 (CYANOCOBALAMIN) 500 MCG tablet Take 500-1,000 mcg by mouth daily.     vitamin C (ASCORBIC ACID) 500 MG tablet Take 500 mg by mouth daily.     zinc gluconate 50 MG tablet Take 50 mg by mouth daily.     No current facility-administered medications for this visit.     Physical Exam: BP 116/77 (BP Location: Left Arm, Patient Position: Sitting)   Pulse 77   Resp 20   Ht 5\' 8"  (1.727 m)   Wt 209 lb (94.8 kg)   SpO2 95% Comment: ra  BMI 31.78 kg/m  He looks well. Cardiac exam shows a regular rate and rhythm with normal heart sounds.  There is no murmur. Lungs are clear.  Diagnostic Tests:  Narrative & Impression  CLINICAL DATA:  Follow up thoracic aortic  aneurysm. No previous relevant surgery.   EXAM: CT ANGIOGRAPHY CHEST WITH CONTRAST   TECHNIQUE: Multidetector CT imaging of the chest was performed using the standard protocol during bolus administration of intravenous contrast. Multiplanar CT image reconstructions and MIPs were obtained to evaluate the vascular anatomy.   RADIATION DOSE REDUCTION: This exam was performed according to the departmental dose-optimization program which includes automated exposure control, adjustment of the mA and/or kV according to patient size and/or use of iterative reconstruction technique.   CONTRAST:  75mL ISOVUE-370 IOPAMIDOL (ISOVUE-370) INJECTION 76%   COMPARISON:  Chest CTA 08/16/2022 and 09/23/2021.   FINDINGS: Cardiovascular: Stable fusiform dilatation of the ascending aorta which has a diameter of 4.5 cm on coronal image 59/5. The  aortic arch and descending aorta are normal in caliber. There is no evidence of aortic dissection. Underlying atherosclerosis of the aorta, great vessels and coronary arteries is again noted. There are calcifications of the aortic valve. The heart size is normal. There is no pericardial effusion.   Mediastinum/Nodes: There are no enlarged mediastinal, hilar or axillary lymph nodes. The thyroid gland, trachea and esophagus demonstrate no significant findings.   Lungs/Pleura: No pleural effusion or pneumothorax. The lungs remain clear.   Upper abdomen: No acute findings are seen in the visualized upper abdomen. There is mild hepatic steatosis post cholecystectomy. No significant biliary dilatation. Hernia of the upper anterior abdominal wall is incompletely visualized, although grossly stable.   Musculoskeletal/Chest wall: There is no chest wall mass or suspicious osseous finding.   Review of the MIP images confirms the above findings.   IMPRESSION: 1. Stable fusiform dilatation of the ascending aorta which has a diameter of 4.5 cm, likely poststenotic secondary to aortic stenosis. No evidence of aortic dissection. Follow-up as clinically warranted. 2. No acute chest findings. 3. Stable appearance of the visualized upper anterior abdominal wall hernia, incompletely visualized. 4. Hepatic steatosis.     Electronically Signed   By: Carey Bullocks M.D.   On: 09/29/2022 14:11      Impression:  He has a bicuspid aortic valve with no stenosis or insufficiency on his last echo which was a TEE on 11/05/2019. His current CTA shows a stable 4.5 cm fusiform ascending aortic aneurysm and he had a CTA of the chest in March 2024 for workup of some abdominal and chest pain which showed a measurement of 4.7 cm. I think it is stable dating back to 2020. It is still below the surgical threshold of 5 cm in a patient with a bicuspid aortic valve.  I reviewed the CT images with the patient and  answered his questions.  I stressed the importance of continued good blood pressure control in preventing further enlargement and acute aortic dissection.  I advised him against doing any heavy lifting that may require a Valsalva maneuver and could suddenly raise his blood pressure to high levels.   Plan:   I will see him back in 1 year with a CTA of the chest. His bicuspid aortic valve will be followed by cardiology.   I spent 15 minutes performing this established patient evaluation and > 50% of this time was spent face to face counseling and coordinating the care of this patient's bicuspid aortic valve and ascending aortic aneurysm.     Alleen Borne, MD [facility] [phone]

## 2022-10-05 NOTE — Telephone Encounter (Signed)
Previous denial on file for this medication

## 2022-10-07 ENCOUNTER — Telehealth: Payer: Self-pay

## 2022-10-07 NOTE — Telephone Encounter (Signed)
Pharmacy Patient Advocate Encounter   Received notification from [facility] that prior authorization for Zepbound 5MG /0.5ML pen-injectors is required/requested.   PA submitted on 10/07/22 to (ins) [employer] via [employer] or (Medicaid) confirmation # [id number] Status is pending

## 2022-10-07 NOTE — Telephone Encounter (Signed)
PA submitted. Created new encounter for PA. Will update and route back to pool once determination has been made.   

## 2022-10-08 ENCOUNTER — Other Ambulatory Visit: Payer: Self-pay | Admitting: Cardiovascular Disease

## 2022-10-08 NOTE — Telephone Encounter (Signed)
Refill Request.  

## 2022-10-11 ENCOUNTER — Other Ambulatory Visit (HOSPITAL_COMMUNITY): Payer: Self-pay

## 2022-10-11 NOTE — Telephone Encounter (Signed)
Patient Advocate Encounter  Prior Authorization for Zepbound 5MG /0.5ML pen-injectors has been approved with [employer].    PA# [id number] Effective dates: 10/09/22 through 02/10/23  Per [facility] test claim, copay for 84 days supply is $24.99 (after eVoucher)

## 2022-10-11 NOTE — Telephone Encounter (Signed)
Pharmacy aware

## 2022-11-10 ENCOUNTER — Telehealth: Payer: Self-pay | Admitting: Family Medicine

## 2022-11-10 MED ORDER — ZEPBOUND 7.5 MG/0.5ML ~~LOC~~ SOAJ: 7.5000 mg | SUBCUTANEOUS | 1 refills | Status: DC

## 2022-11-10 NOTE — Telephone Encounter (Signed)
Pt made aware on vmail. Advised to call back with concerns. ?

## 2022-11-10 NOTE — Telephone Encounter (Signed)
Sent in the next dose up which is 7.5 mg for him.

## 2022-12-06 ENCOUNTER — Ambulatory Visit: Payer: BC Managed Care – PPO | Admitting: Family Medicine

## 2022-12-06 ENCOUNTER — Encounter: Payer: Self-pay | Admitting: Family Medicine

## 2022-12-06 VITALS — BP 119/70 | HR 71 | Ht 68.0 in | Wt 200.0 lb

## 2022-12-06 DIAGNOSIS — E669 Obesity, unspecified: Secondary | ICD-10-CM | POA: Diagnosis not present

## 2022-12-06 DIAGNOSIS — I2510 Atherosclerotic heart disease of native coronary artery without angina pectoris: Secondary | ICD-10-CM | POA: Diagnosis not present

## 2022-12-06 DIAGNOSIS — Z6830 Body mass index (BMI) 30.0-30.9, adult: Secondary | ICD-10-CM | POA: Diagnosis not present

## 2022-12-06 MED ORDER — ZEPBOUND 10 MG/0.5ML ~~LOC~~ SOAJ: 10.0000 mg | SUBCUTANEOUS | 3 refills | Status: DC

## 2022-12-06 NOTE — Progress Notes (Signed)
BP 119/70   Pulse 71   Ht 5\' 8"  (1.727 m)   Wt 200 lb (90.7 kg)   SpO2 97%   BMI 30.41 kg/m    Subjective:   Patient ID: Jonathan Burke, male    DOB: Jul 12, 1960, 62 y.o.   MRN: [id number]  HPI: Jonathan Burke is a 62 y.o. male presenting on 12/06/2022 for Medical Management of Chronic Issues and Obesity   HPI Obesity and weight recheck with CAD Patient is coming in with obesity and weight recheck with known CAD.  He is on is down another 9 pounds from last time.  He is feeling good with it, he does want to try and go up on the dose.  He is down another 9 pounds from the last visit.  He still fights some constipation but otherwise doing pretty well, he is trying to keep active and adding exercise and movement.  Relevant past medical, surgical, family and social history reviewed and updated as indicated. Interim medical history since our last visit reviewed. Allergies and medications reviewed and updated.  Review of Systems  Constitutional:  Negative for chills and fever.  Eyes:  Negative for visual disturbance.  Respiratory:  Negative for shortness of breath and wheezing.   Cardiovascular:  Negative for chest pain and leg swelling.  Musculoskeletal:  Negative for back pain and gait problem.  Skin:  Negative for rash.  Neurological:  Negative for dizziness, weakness and light-headedness.  All other systems reviewed and are negative.   Per HPI unless specifically indicated above   Allergies as of 12/06/2022       Reactions   Adhesive [tape] Other (See Comments)   Regular tape pulls OFF the skin!! Only paper tape is tolerated.   Atorvastatin Other (See Comments)   Made the patient's bones hurt        Medication List        Accurate as of December 06, 2022 10:53 AM. If you have any questions, ask your nurse or doctor.          STOP taking these medications    HYDROcodone-acetaminophen 5-325 MG tablet Commonly known as: Norco Stopped by: Elige Radon Fatina Sprankle, MD    Hyoscyamine Sulfate SL 0.125 MG Subl Commonly known as: Levsin/SL Stopped by: Elige Radon Eugen Jeansonne, MD   Zepbound 7.5 MG/0.5ML Pen Generic drug: tirzepatide Replaced by: Zepbound 10 MG/0.5ML Pen Stopped by: Elige Radon Alexa Blish, MD       TAKE these medications    Acetaminophen 500 MG capsule Take 500-1,000 mg by mouth every 6 (six) hours as needed for pain (or headaches).   Artificial Tears PF 0.1-0.3 % Soln Generic drug: Dextran 70-Hypromellose (PF) Place 1 drop into both eyes 3 (three) times daily as needed (for dryness).   ascorbic acid 500 MG tablet Commonly known as: VITAMIN C Take 500 mg by mouth daily.   aspirin EC 81 MG tablet Take 81 mg by mouth daily.   cyanocobalamin 500 MCG tablet Commonly known as: VITAMIN B12 Take 500-1,000 mcg by mouth daily.   losartan 25 MG tablet Commonly known as: COZAAR TAKE ONE (1) TABLET EACH DAY   ondansetron 4 MG disintegrating tablet Commonly known as: ZOFRAN-ODT Take 1 tablet (4 mg total) by mouth every 8 (eight) hours as needed for nausea or vomiting.   pantoprazole 20 MG tablet Commonly known as: PROTONIX TAKE ONE (1) TABLET BY MOUTH EVERY DAY   pantoprazole 20 MG tablet Commonly known as: PROTONIX Take 1 tablet (20  mg total) by mouth 2 (two) times daily.   pravastatin 20 MG tablet Commonly known as: PRAVACHOL Take 1 tablet (20 mg total) by mouth daily.   sucralfate 1 g tablet Commonly known as: Carafate Take 1 tablet (1 g total) by mouth 4 (four) times daily -  with meals and at bedtime.   Vitamin D3 50 MCG (2000 UT) Tabs Take 2,000 Units by mouth daily.   Zepbound 10 MG/0.5ML Pen Generic drug: tirzepatide Inject 10 mg into the skin once a week. Replaces: Zepbound 7.5 MG/0.5ML Pen Started by: Elige Radon Ziggy Reveles, MD   zinc gluconate 50 MG tablet Take 50 mg by mouth daily.         Objective:   BP 119/70   Pulse 71   Ht 5\' 8"  (1.727 m)   Wt 200 lb (90.7 kg)   SpO2 97%   BMI 30.41 kg/m   Wt  Readings from Last 3 Encounters:  12/06/22 200 lb (90.7 kg)  09/29/22 209 lb (94.8 kg)  09/23/22 211 lb (95.7 kg)    Physical Exam Vitals and nursing note reviewed.  Constitutional:      General: He is not in acute distress.    Appearance: He is well-developed. He is not diaphoretic.  Eyes:     General: No scleral icterus.    Conjunctiva/sclera: Conjunctivae normal.  Neck:     Thyroid: No thyromegaly.  Cardiovascular:     Rate and Rhythm: Normal rate and regular rhythm.     Heart sounds: Normal heart sounds. No murmur heard. Pulmonary:     Effort: Pulmonary effort is normal. No respiratory distress.     Breath sounds: Normal breath sounds. No wheezing.  Musculoskeletal:        General: No swelling. Normal range of motion.     Cervical back: Neck supple.  Lymphadenopathy:     Cervical: No cervical adenopathy.  Skin:    General: Skin is warm and dry.     Findings: No rash.  Neurological:     Mental Status: He is alert and oriented to person, place, and time.     Coordination: Coordination normal.  Psychiatric:        Behavior: Behavior normal.       Assessment & Plan:   Problem List Items Addressed This Visit       Cardiovascular and Mediastinum   Coronary artery disease involving native coronary artery of native heart without angina pectoris   Relevant Medications   tirzepatide (ZEPBOUND) 10 MG/0.5ML Pen     Other   Obesity (BMI 30-39.9) - Primary   Relevant Medications   tirzepatide (ZEPBOUND) 10 MG/0.5ML Pen  Patient is doing really well on the stepdown, will increase the dose to 10 mg but if he has more constipation symptoms then we will back off.  Follow up plan: Return in about 3 months (around 03/08/2023), or if symptoms worsen or fail to improve, for Obesity recheck.  Counseling provided for all of the vaccine components No orders of the defined types were placed in this encounter.   Arville Care, MD [facility] 12/06/2022,  10:53 AM

## 2022-12-14 ENCOUNTER — Ambulatory Visit: Payer: BC Managed Care – PPO | Admitting: Nurse Practitioner

## 2022-12-14 ENCOUNTER — Encounter: Payer: Self-pay | Admitting: Nurse Practitioner

## 2022-12-14 VITALS — BP 100/68 | HR 78 | Temp 98.3°F | Ht 68.0 in | Wt 196.8 lb

## 2022-12-14 DIAGNOSIS — J309 Allergic rhinitis, unspecified: Secondary | ICD-10-CM

## 2022-12-14 DIAGNOSIS — J3489 Other specified disorders of nose and nasal sinuses: Secondary | ICD-10-CM

## 2022-12-14 DIAGNOSIS — R0981 Nasal congestion: Secondary | ICD-10-CM

## 2022-12-14 DIAGNOSIS — J029 Acute pharyngitis, unspecified: Secondary | ICD-10-CM | POA: Diagnosis not present

## 2022-12-14 DIAGNOSIS — R051 Acute cough: Secondary | ICD-10-CM | POA: Diagnosis not present

## 2022-12-14 LAB — CULTURE, GROUP A STREP

## 2022-12-14 LAB — RAPID STREP SCREEN (MED CTR MEBANE ONLY): Strep Gp A Ag, IA W/Reflex: NEGATIVE

## 2022-12-14 MED ORDER — CETIRIZINE HCL 10 MG PO TABS: 10.0000 mg | ORAL_TABLET | Freq: Every day | ORAL | 1 refills | Status: DC

## 2022-12-14 MED ORDER — FLUTICASONE PROPIONATE 50 MCG/ACT NA SUSP: 2.0000 | Freq: Every day | NASAL | 1 refills | Status: DC

## 2022-12-14 NOTE — Progress Notes (Addendum)
Acute Office Visit  Subjective:     Patient ID: Jonathan Burke, male    DOB: 11/29/1960, 62 y.o.   MRN: [id number]  Chief Complaint  Patient presents with   Sore Throat    Started yesterday   Cough    Started yesterday   chest congestion    Started yesterday     HPI Jonathan Burke is a 62 y.o. male who complains of congestion, sore throat, and dry cough for 1 days. He denies a history of chest pain, chills, dizziness, fatigue, fevers, myalgias, nausea, vomiting, weakness, and cough and denies a history of asthma. Patient denies smoke cigarettes. Has not take any OTC medication. POC COVID, Strep ordered Strep negative, will order culture    Review of Systems  Constitutional: Negative.  Negative for chills, fever and malaise/fatigue.  HENT:  Positive for congestion and sore throat. Negative for ear pain and tinnitus.   Eyes: Negative.  Negative for double vision and pain.  Respiratory:  Positive for cough. Negative for sputum production, shortness of breath and wheezing.   Gastrointestinal: Negative.  Negative for constipation, nausea and vomiting.  Musculoskeletal: Negative.  Negative for back pain and myalgias.  Skin: Negative.  Negative for itching and rash.  Neurological:  Negative for dizziness, tremors, weakness and headaches.  Psychiatric/Behavioral:  Negative for substance abuse and suicidal ideas.    Negative unless indicated in HPI    Objective:    BP 100/68   Pulse 78   Temp 98.3 F (36.8 C) (Temporal)   Ht 5\' 8"  (1.727 m)   Wt 196 lb 12.8 oz (89.3 kg)   SpO2 95%   BMI 29.92 kg/m  BP Readings from Last 3 Encounters:  12/14/22 100/68  12/06/22 119/70  09/29/22 116/77   Wt Readings from Last 3 Encounters:  12/14/22 196 lb 12.8 oz (89.3 kg)  12/06/22 200 lb (90.7 kg)  09/29/22 209 lb (94.8 kg)      Physical Exam Vitals and nursing note reviewed.  Constitutional:      General: He is not in acute distress.    Appearance: He is well-developed.  HENT:      Head: Normocephalic.     Nose: Nasal tenderness, congestion and rhinorrhea present.     Right Nostril: No foreign body or epistaxis.     Right Sinus: Frontal sinus tenderness present. No maxillary sinus tenderness.     Left Sinus: Frontal sinus tenderness present. No maxillary sinus tenderness.     Mouth/Throat:     Lips: Pink.     Pharynx: Uvula midline. Postnasal drip present. No pharyngeal swelling or posterior oropharyngeal erythema.     Tonsils: No tonsillar exudate or tonsillar abscesses.  Eyes:     General: No scleral icterus.    Extraocular Movements: Extraocular movements intact.     Conjunctiva/sclera: Conjunctivae normal.     Pupils: Pupils are equal, round, and reactive to light.  Cardiovascular:     Rate and Rhythm: Normal rate and regular rhythm.  Musculoskeletal:        General: Normal range of motion.     Cervical back: Normal range of motion and neck supple. No rigidity.     Right lower leg: No edema.     Left lower leg: No edema.  Skin:    General: Skin is warm and dry.     Findings: No bruising or rash.  Neurological:     General: No focal deficit present.     Mental Status: He  is alert and oriented to person, place, and time. Mental status is at baseline.  Psychiatric:        Mood and Affect: Mood normal.        Behavior: Behavior normal.        Thought Content: Thought content normal.        Judgment: Judgment normal.    No results found for any visits on 12/14/22.      Assessment & Plan:  Acute cough -     Novel Coronavirus, NAA ([facility]) -     Rapid Strep Screen ([facility] ONLY) -     Fluticasone Propionate; Place 2 sprays into both nostrils daily.  Dispense: 16 g; Refill: 1  Sore throat -     Novel Coronavirus, NAA ([facility]) -     Rapid Strep Screen ([facility] ONLY) -     Culture, Group A Strep -     Fluticasone Propionate; Place 2 sprays into both nostrils daily.  Dispense: 16 g; Refill: 1  Nasal congestion with rhinorrhea -      Fluticasone Propionate; Place 2 sprays into both nostrils daily.  Dispense: 16 g; Refill: 1  Allergic rhinitis, unspecified seasonality, unspecified trigger -     Cetirizine HCl; Take 1 tablet (10 mg total) by mouth daily.  Dispense: 90 tablet; Refill: 1   ASSESSMENT:  viral upper respiratory illness and sinusitis  PLAN: Flonase 1 spray on each Nostrils - Awaiting for strep culture Zyrtec for allergic rhinitis - Push fluids, rest, use acetaminophen, ibuprofen, cough suppressant of choice prn, return office visit prn if symptoms persist or worsen Lack of antibiotic effectiveness discussed with him. Call or return to clinic prn if these symptoms worsen or fail to improve as anticipated.  - plan of care discussed with client, all questions answered Return if symptoms worsen or fail to improve.  [name], DNP [facility] [address] [place], [place] [address] [phone]

## 2022-12-15 ENCOUNTER — Telehealth: Payer: Self-pay | Admitting: Family Medicine

## 2022-12-15 LAB — NOVEL CORONAVIRUS, NAA: SARS-CoV-2, NAA: DETECTED — AB

## 2022-12-15 MED ORDER — NIRMATRELVIR/RITONAVIR (PAXLOVID)TABLET: 3.0000 | ORAL_TABLET | Freq: Two times a day (BID) | ORAL | 0 refills | Status: AC

## 2022-12-15 NOTE — Telephone Encounter (Signed)
Sent Paxlovid for the patient.

## 2022-12-15 NOTE — Progress Notes (Signed)
COVID positive, however you are outside of the perimeter for Paxlovid Follow CDC guidelines for COVID ?  Monitor your symptoms Seek prompt medical attention if your illness is worsening (e.g., difficulty breathing). Before going to your medical appointment, call the healthcare provider and tell them that you have, or are being evaluated for, COVID-19 infection. Ask your healthcare provider to call the local or state health department.  Wear a facemask You should wear a facemask that covers your nose and mouth when you are in the same room with other people and when you visit a healthcare provider. People who live with or visit you should also wear a facemask while they are in the same room with you.  Separate yourself from other people in your home As much as possible, you should stay in a different room from other people in your home. Also, you should use a separate bathroom, if available.  Avoid sharing household items You should not share dishes, drinking glasses, cups, eating utensils, towels, bedding, or other items with other people in your home. After using these items, you should wash them thoroughly with soap and water.  Cover your coughs and sneezes Cover your mouth and nose with a tissue when you cough or sneeze, or you can cough or sneeze into your sleeve. Throw used tissues in a lined trash can, and immediately wash your hands with soap and water for at least 20 seconds or use an alcohol-based hand rub.  Wash your [employer] your hands often and thoroughly with soap and water for at least 20 seconds. You can use an alcohol-based hand sanitizer if soap and water are not available and if your hands are not visibly dirty. Avoid touching your eyes, nose, and mouth with unwashed hands.   Prevention Steps for Caregivers and Household Members of Individuals Confirmed to have, or Being Evaluated for, COVID-19 Infection Being Cared for in the Home  If you live with, or provide  care at home for, a person confirmed to have, or being evaluated for, COVID-19 infection please follow these guidelines to prevent infection:  Follow healthcare provider's instructions Make sure that you understand and can help the patient follow any healthcare provider instructions for all care.  Provide for the patient's basic needs You should help the patient with basic needs in the home and provide support for getting groceries, prescriptions, and other personal needs.  Monitor the patient's symptoms If they are getting sicker, call his or her medical provider and tell them that the patient has, or is being evaluated for, COVID-19 infection. This will help the healthcare provider's office take steps to keep other people from getting infected. Ask the healthcare provider to call the local or state health department.  Limit the number of people who have contact with the patient If possible, have only one caregiver for the patient. Other household members should stay in another home or place of residence. If this is not possible, they should stay in another room, or be separated from the patient as much as possible. Use a separate bathroom, if available. Restrict visitors who do not have an essential need to be in the home.  Keep older adults, very young children, and other sick people away from the patient Keep older adults, very young children, and those who have compromised immune systems or chronic health conditions away from the patient. This includes people with chronic heart, lung, or kidney conditions, diabetes, and cancer.  Ensure good ventilation Make sure that shared spaces  in the home have good air flow, such as from an air conditioner or an opened window, weather permitting.  Wash your hands often Wash your hands often and thoroughly with soap and water for at least 20 seconds. You can use an alcohol based hand sanitizer if soap and water are not available and if your hands  are not visibly dirty. Avoid touching your eyes, nose, and mouth with unwashed hands. Use disposable paper towels to dry your hands. If not available, use dedicated cloth towels and replace them when they become wet.  Wear a facemask and gloves Wear a disposable facemask at all times in the room and gloves when you touch or have contact with the patient's blood, body fluids, and/or secretions or excretions, such as sweat, saliva, sputum, nasal mucus, vomit, urine, or feces.  Ensure the mask fits over your nose and mouth tightly, and do not touch it during use. Throw out disposable facemasks and gloves after using them. Do not reuse. Wash your hands immediately after removing your facemask and gloves. If your personal clothing becomes contaminated, carefully remove clothing and launder. Wash your hands after handling contaminated clothing. Place all used disposable facemasks, gloves, and other waste in a lined container before disposing them with other household waste. Remove gloves and wash your hands immediately after handling these items.  Do not share dishes, glasses, or other household items with the patient Avoid sharing household items. You should not share dishes, drinking glasses, cups, eating utensils, towels, bedding, or other items with a patient who is confirmed to have, or being evaluated for, COVID-19 infection. After the person uses these items, you should wash them thoroughly with soap and water.  Wash laundry thoroughly Immediately remove and wash clothes or bedding that have blood, body fluids, and/or secretions or excretions, such as sweat, saliva, sputum, nasal mucus, vomit, urine, or feces, on them. Wear gloves when handling laundry from the patient. Read and follow directions on labels of laundry or clothing items and detergent. In general, wash and dry with the warmest temperatures recommended on the label.  Clean all areas the individual has used often Clean all  touchable surfaces, such as counters, tabletops, doorknobs, bathroom fixtures, toilets, phones, keyboards, tablets, and bedside tables, every day. Also, clean any surfaces that may have blood, body fluids, and/or secretions or excretions on them. Wear gloves when cleaning surfaces the patient has come in contact with. Use a diluted bleach solution (e.g., dilute bleach with 1 part bleach and 10 parts water) or a household disinfectant with a label that says EPA-registered for coronaviruses. To make a bleach solution at home, add 1 tablespoon of bleach to 1 quart (4 cups) of water. For a larger supply, add  cup of bleach to 1 gallon (16 cups) of water. Read labels of cleaning products and follow recommendations provided on product labels. Labels contain instructions for safe and effective use of the cleaning product including precautions you should take when applying the product, such as wearing gloves or eye protection and making sure you have good ventilation during use of the product. Remove gloves and wash hands immediately after cleaning.  Monitor yourself for signs and symptoms of illness Caregivers and household members are considered close contacts, should monitor their health, and will be asked to limit movement outside of the home to the extent possible. Follow the monitoring steps for close contacts listed on the symptom monitoring form.   ? If you have additional questions, contact your local health department  or call the epidemiologist on call at [phone] (available 24/7). ? This guidance is subject to change. For the most up-to-date guidance from [facility], please refer to their website: [email or web address]

## 2022-12-15 NOTE — Telephone Encounter (Signed)
 Left message making pt aware and to call back if needed.

## 2022-12-16 LAB — CULTURE, GROUP A STREP: Strep A Culture: NEGATIVE

## 2022-12-20 ENCOUNTER — Telehealth: Payer: Self-pay | Admitting: Cardiovascular Disease

## 2022-12-20 DIAGNOSIS — E785 Hyperlipidemia, unspecified: Secondary | ICD-10-CM

## 2022-12-20 DIAGNOSIS — I10 Essential (primary) hypertension: Secondary | ICD-10-CM

## 2022-12-20 NOTE — Telephone Encounter (Signed)
Returned the call to the patient. He stated that he was diagnosed with Covid last week and has since started Paxlovid.   Since Friday he developed decreased blood pressures and has been feeling weak and fatigued. Blood pressures Friday and Saturday were: 90/62 97/56 98/48  92/48  The patient did not take Losartan yesterday or this morning. Blood pressure this morning was 110/62. While on the phone it was 105/68.  The patient has also lost 60 pounds since last seen in December 2023. The patient is prescribed Losartan 25 mg once daily.

## 2022-12-20 NOTE — Telephone Encounter (Signed)
Pt c/o BP issue: STAT if pt c/o blurred vision, one-sided weakness or slurred speech  1. What are your last 5 BP readings?   90/62 97/56 98/48  92/48  2. Are you having any other symptoms (ex. Dizziness, headache, blurred vision, passed out)? Headache, lightheaded   3. What is your BP issue?  Pt spouse called in stating pt's bp has been low. Please advise.

## 2022-12-21 NOTE — Telephone Encounter (Signed)
Pt's wife calling back to f/u on call from yesterday. States that today will be the 3rd day that pt has not taken Losartan. They would like a callback as to what the next step is. Please advise

## 2022-12-21 NOTE — Telephone Encounter (Signed)
Patient has been made aware and will continue to monitor his blood pressure. He will go to his PCP to get his labs completed.

## 2022-12-21 NOTE — Telephone Encounter (Signed)
Spoke to the patient, he test positive for Covid on 7/16 was prescribed Paxlovid. Pt stated since starting the medication is blood pressure is lower than his baseline. Pt he held his Losartan since Saturday, pt did check his bp this morning 101/62. Pt stated he does experience dizziness if moves "too  quick". Will forward to MD and nurse for advise.

## 2022-12-21 NOTE — Telephone Encounter (Signed)
Stop losartan for now.  Check CBC and basic metabolic profile.  Increase fluid intake and continue to monitor blood pressure.  Losartan might be resumed once he recovers from COVID depending on his blood pressure readings.

## 2022-12-27 ENCOUNTER — Other Ambulatory Visit: Payer: BC Managed Care – PPO

## 2022-12-27 DIAGNOSIS — I10 Essential (primary) hypertension: Secondary | ICD-10-CM

## 2022-12-27 DIAGNOSIS — E785 Hyperlipidemia, unspecified: Secondary | ICD-10-CM

## 2022-12-27 LAB — BASIC METABOLIC PANEL
BUN/Creatinine Ratio: 14 (ref 10–24)
BUN: 10 mg/dL (ref 8–27)
CO2: 23 mmol/L (ref 20–29)
Chloride: 103 mmol/L (ref 96–106)
Creatinine, Ser: 0.72 mg/dL — ABNORMAL LOW (ref 0.76–1.27)
Glucose: 94 mg/dL (ref 70–99)
Potassium: 4.7 mmol/L (ref 3.5–5.2)
Sodium: 140 mmol/L (ref 134–144)
eGFR: 103 mL/min/{1.73_m2} (ref >59)

## 2022-12-27 LAB — CBC
Hematocrit: 40.7 % (ref 37.5–51.0)
Hemoglobin: 14.2 g/dL (ref 13.0–17.7)
MCH: 31.3 pg (ref 26.6–33.0)
MCHC: 34.9 g/dL (ref 31.5–35.7)
MCV: 90 fL (ref 79–97)
Platelets: 267 10*3/uL (ref 150–450)
RBC: 4.54 x10E6/uL (ref 4.14–5.80)
RDW: 12.5 % (ref 11.6–15.4)
WBC: 7 10*3/uL (ref 3.4–10.8)

## 2022-12-28 ENCOUNTER — Encounter: Payer: Self-pay | Admitting: *Deleted

## 2022-12-31 ENCOUNTER — Telehealth: Payer: Self-pay | Admitting: Cardiovascular Disease

## 2022-12-31 NOTE — Telephone Encounter (Signed)
Patient's wife is requesting we call the patient to go over the lab results. Please advise.

## 2022-12-31 NOTE — Telephone Encounter (Signed)
Left voicemail for patient to return call to office. 

## 2023-01-03 NOTE — Telephone Encounter (Signed)
Results given.

## 2023-01-03 NOTE — Telephone Encounter (Signed)
Patient called regarding other issue.  States BP running 110/65  115/68   110/70.  Has NOT resumed Losartan.  Advised continue to hold.  Advised to continue to hold Losartan.  Advised continue to check BP readings. Also advised continue slow position changes Any other recommendations, please advise.

## 2023-01-03 NOTE — Telephone Encounter (Signed)
Call to patient. LM to call office

## 2023-01-04 NOTE — Telephone Encounter (Signed)
Returned pt call. Pt requested lab results. Advised pt of results. Pt verbalized understanding.

## 2023-01-07 ENCOUNTER — Telehealth: Payer: Self-pay | Admitting: Family Medicine

## 2023-01-07 MED ORDER — ZEPBOUND 12.5 MG/0.5ML ~~LOC~~ SOAJ: 12.5000 mg | SUBCUTANEOUS | 1 refills | Status: DC

## 2023-01-07 NOTE — Telephone Encounter (Signed)
Pt states that he has a BM everyday and not having problems with BM's. States that just in the past month he has not lost any weight.

## 2023-01-07 NOTE — Addendum Note (Signed)
Addended by: Arville Care on: 01/07/2023 04:49 PM   Modules accepted: Orders

## 2023-01-07 NOTE — Telephone Encounter (Signed)
Patient thinks that he needs to go up on  tirzepatide (ZEPBOUND) 10 MG/0.5ML Pen.   Stated that he is currently on 10mg  and wants to go up to 12.5mg  because they have been raising it every few months. Please call back and advise.

## 2023-01-07 NOTE — Telephone Encounter (Signed)
Before going up I would like to know what his symptoms are like, the last time we spoke he was having a lot of constipation issues and I would be hesitant to go up again if he is having constipation issues.

## 2023-01-27 NOTE — Progress Notes (Signed)
Test results

## 2023-02-01 ENCOUNTER — Other Ambulatory Visit: Payer: Self-pay

## 2023-02-01 MED ORDER — PANTOPRAZOLE SODIUM 20 MG PO TBEC: 20.0000 mg | DELAYED_RELEASE_TABLET | Freq: Two times a day (BID) | ORAL | 3 refills | Status: DC

## 2023-02-01 NOTE — Telephone Encounter (Signed)
Pt's medication was sent to pt's pharmacy as requested. Confirmation received.  °

## 2023-02-04 ENCOUNTER — Telehealth: Payer: Self-pay

## 2023-02-04 NOTE — Telephone Encounter (Signed)
Jonathan Burke (Key: [id number]) Need Help? Call us at [phone] Status sent iconSent to Plan today Drug Zepbound 5MG /0.5ML pen-injectors ePA cloud logo Form [employer] [place] Commercial Electronic Request Form

## 2023-02-15 ENCOUNTER — Other Ambulatory Visit (HOSPITAL_COMMUNITY): Payer: Self-pay

## 2023-02-15 NOTE — Telephone Encounter (Signed)
PA was submitted for Zepbound 5mg /0.6ml, pt chart shows they are on 12.5mg /0.47ml, please confirm dosing, thanks

## 2023-02-16 NOTE — Telephone Encounter (Signed)
Yes patient is now on 12.5

## 2023-02-17 ENCOUNTER — Other Ambulatory Visit (HOSPITAL_COMMUNITY): Payer: Self-pay

## 2023-02-17 NOTE — Telephone Encounter (Signed)
Pharmacy Patient Advocate Encounter  Received notification from [facility] that Prior Authorization for Zepbound has been APPROVED from 02/15/2023 to 02/04/2024   PA #/Case ID/Reference #: [id number]

## 2023-02-17 NOTE — Telephone Encounter (Signed)
Pt aware of PA approval

## 2023-03-03 DIAGNOSIS — H18593 Other hereditary corneal dystrophies, bilateral: Secondary | ICD-10-CM | POA: Diagnosis not present

## 2023-03-10 ENCOUNTER — Ambulatory Visit: Payer: BC Managed Care – PPO | Admitting: Family Medicine

## 2023-04-14 ENCOUNTER — Encounter: Payer: Self-pay | Admitting: Family Medicine

## 2023-04-14 ENCOUNTER — Ambulatory Visit: Payer: BC Managed Care – PPO | Admitting: Family Medicine

## 2023-04-14 VITALS — BP 111/67 | HR 75 | Ht 68.0 in | Wt 184.0 lb

## 2023-04-14 DIAGNOSIS — N401 Enlarged prostate with lower urinary tract symptoms: Secondary | ICD-10-CM

## 2023-04-14 DIAGNOSIS — I2510 Atherosclerotic heart disease of native coronary artery without angina pectoris: Secondary | ICD-10-CM | POA: Diagnosis not present

## 2023-04-14 DIAGNOSIS — E785 Hyperlipidemia, unspecified: Secondary | ICD-10-CM

## 2023-04-14 DIAGNOSIS — I10 Essential (primary) hypertension: Secondary | ICD-10-CM | POA: Diagnosis not present

## 2023-04-14 DIAGNOSIS — J40 Bronchitis, not specified as acute or chronic: Secondary | ICD-10-CM

## 2023-04-14 DIAGNOSIS — R350 Frequency of micturition: Secondary | ICD-10-CM

## 2023-04-14 MED ORDER — AMOXICILLIN 500 MG PO CAPS: 500.0000 mg | ORAL_CAPSULE | Freq: Two times a day (BID) | ORAL | 0 refills | Status: DC

## 2023-04-14 NOTE — Addendum Note (Signed)
Addended by: Arville Care on: 04/14/2023 02:12 PM   Modules accepted: Orders

## 2023-04-14 NOTE — Progress Notes (Addendum)
BP 111/67   Pulse 75   Ht 5\' 8"  (1.727 m)   Wt 184 lb (83.5 kg)   SpO2 95%   BMI 27.98 kg/m    Subjective:   Patient ID: Jonathan Burke, male    DOB: 01/28/1961, 62 y.o.   MRN: [id number]  HPI: Jonathan Burke is a 62 y.o. male presenting on 04/14/2023 for Medical Management of Chronic Issues and Hypertension   HPI Hypertension Patient is currently on no medication like metoprolol his blood pressure medication, and their blood pressure today is 111/67. Patient denies any lightheadedness or dizziness. Patient denies headaches, blurred vision, chest pains, shortness of breath, or weakness. Denies any side effects from medication and is content with current medication.   Hyperlipidemia and CAD recheck Patient is coming in for recheck of his hyperlipidemia. The patient is currently taking pravastatin. They deny any issues with myalgias or history of liver damage from it. They deny any focal numbness or weakness or chest pain.   BPH Patient is coming in for recheck on BPH Symptoms: None currently Medication: None Last PSA: Over a year ago  Patient has been having 2 weeks of sinus congestion and drainage cough and he feels like it is getting deep down in his chest.  He tried some over-the-counter Sudafed which is not improving.  He denies any shortness of breath or wheezing.  He denies any fevers or chills.  He denies any sick contacts that he knows of.  He is just been to where he just cannot clear this.  Obesity recheck Patient is coming in today for obesity recheck.  He is currently been on Zepbound and it looks like he is down another 12 pounds from the last time he was seen.  He feels like he is doing well.  Will keep him at the current dose because he a lot of stomach issues when he came up to this dose and is down 12 pounds.  He denies any issues right now.  He is getting closer to his target goal weight which is between 175 and 180 and is very satisfied with it. Patient's BMI is  >30 mg/m2.  Patient's current BMI is Body mass index is 27.98 kg/m.Marland Kitchen  Patient has lost and maintained a 5% body weight loss.  Patient is currently enrolled in a healthy eating plan along with encouraged exercise.   Patient does not have a personal or family history of medullary thyroid carcinoma (MTC) or Multiple Endocrine Neoplasia syndrome type 2 (MEN 2).   Relevant past medical, surgical, family and social history reviewed and updated as indicated. Interim medical history since our last visit reviewed. Allergies and medications reviewed and updated.  Review of Systems  Constitutional:  Negative for chills and fever.  Eyes:  Negative for visual disturbance.  Respiratory:  Negative for shortness of breath and wheezing.   Cardiovascular:  Negative for chest pain and leg swelling.  Musculoskeletal:  Negative for back pain and gait problem.  Skin:  Negative for rash.  Neurological:  Negative for dizziness and light-headedness.  All other systems reviewed and are negative.   Per HPI unless specifically indicated above   Allergies as of 04/14/2023       Reactions   Adhesive [tape] Other (See Comments)   Regular tape pulls OFF the skin!! Only paper tape is tolerated.   Atorvastatin Other (See Comments)   Made the patient's bones hurt        Medication List  Accurate as of April 14, 2023  2:12 PM. If you have any questions, ask your nurse or doctor.          STOP taking these medications    losartan 25 MG tablet Commonly known as: COZAAR Stopped by: Elige Radon Venancio Chenier       TAKE these medications    Acetaminophen 500 MG capsule Take 500-1,000 mg by mouth every 6 (six) hours as needed for pain (or headaches).   amoxicillin 500 MG capsule Commonly known as: AMOXIL Take 1 capsule (500 mg total) by mouth 2 (two) times daily. Started by: Elige Radon Karsynn Deweese   Artificial Tears PF 0.1-0.3 % Soln Generic drug: Dextran 70-Hypromellose (PF) Place 1 drop into both  eyes 3 (three) times daily as needed (for dryness).   ascorbic acid 500 MG tablet Commonly known as: VITAMIN C Take 500 mg by mouth daily.   aspirin EC 81 MG tablet Take 81 mg by mouth daily.   cetirizine 10 MG tablet Commonly known as: ZYRTEC Take 1 tablet (10 mg total) by mouth daily.   cyanocobalamin 500 MCG tablet Commonly known as: VITAMIN B12 Take 500-1,000 mcg by mouth daily.   fluticasone 50 MCG/ACT nasal spray Commonly known as: FLONASE Place 2 sprays into both nostrils daily.   ondansetron 4 MG disintegrating tablet Commonly known as: ZOFRAN-ODT Take 1 tablet (4 mg total) by mouth every 8 (eight) hours as needed for nausea or vomiting.   pantoprazole 20 MG tablet Commonly known as: PROTONIX Take 1 tablet (20 mg total) by mouth 2 (two) times daily.   pravastatin 20 MG tablet Commonly known as: PRAVACHOL Take 1 tablet (20 mg total) by mouth daily.   sucralfate 1 g tablet Commonly known as: Carafate Take 1 tablet (1 g total) by mouth 4 (four) times daily -  with meals and at bedtime.   Vitamin D3 50 MCG (2000 UT) Tabs Take 2,000 Units by mouth daily.   Zepbound 12.5 MG/0.5ML Pen Generic drug: tirzepatide Inject 12.5 mg into the skin once a week.   zinc gluconate 50 MG tablet Take 50 mg by mouth daily.         Objective:   BP 111/67   Pulse 75   Ht 5\' 8"  (1.727 m)   Wt 184 lb (83.5 kg)   SpO2 95%   BMI 27.98 kg/m   Wt Readings from Last 3 Encounters:  04/14/23 184 lb (83.5 kg)  12/14/22 196 lb 12.8 oz (89.3 kg)  12/06/22 200 lb (90.7 kg)    Physical Exam Vitals and nursing note reviewed.  Constitutional:      General: He is not in acute distress.    Appearance: He is well-developed. He is not diaphoretic.  Eyes:     General: No scleral icterus.    Conjunctiva/sclera: Conjunctivae normal.  Neck:     Thyroid: No thyromegaly.  Cardiovascular:     Rate and Rhythm: Normal rate and regular rhythm.     Heart sounds: Normal heart sounds. No  murmur heard. Pulmonary:     Effort: Pulmonary effort is normal. No respiratory distress.     Breath sounds: Normal breath sounds. No wheezing.  Musculoskeletal:        General: No swelling. Normal range of motion.     Cervical back: Neck supple.  Lymphadenopathy:     Cervical: No cervical adenopathy.  Skin:    General: Skin is warm and dry.     Findings: No rash.  Neurological:  Mental Status: He is alert and oriented to person, place, and time.     Coordination: Coordination normal.  Psychiatric:        Behavior: Behavior normal.       Assessment & Plan:   Problem List Items Addressed This Visit       Cardiovascular and Mediastinum   Essential hypertension   Relevant Orders   CBC with Differential/Platelet   CMP14+EGFR   Coronary artery disease involving native coronary artery of native heart without angina pectoris   Relevant Orders   CBC with Differential/Platelet   CMP14+EGFR     Genitourinary   BPH (benign prostatic hyperplasia)   Relevant Orders   PSA, total and free     Other   Dyslipidemia (high LDL; low HDL) - Primary   Relevant Orders   Lipid panel   Other Visit Diagnoses     Bronchitis       Relevant Medications   amoxicillin (AMOXIL) 500 MG capsule       Will continue current dose of the Zepbound, he seems to be doing well at it and he is close to his target weight will maintain.  Sent amoxicillin for his bronchitis and recommended that he also take Mucinex and Flonase.  Will do blood work today.  Otherwise seems to be doing well, no changes Follow up plan: Return in about 6 months (around 10/12/2023), or if symptoms worsen or fail to improve, for Cholesterol and hypertension.  Counseling provided for all of the vaccine components Orders Placed This Encounter  Procedures   CBC with Differential/Platelet   CMP14+EGFR   Lipid panel   PSA, total and free    Arville Care, MD [facility] 04/14/2023, 2:12  PM

## 2023-04-26 IMAGING — DX DG ABD PORTABLE 1V
1 series · 1 of 1 positions shown · non-contrast
Comparison: CT examination performed earlier on the same date

CLINICAL DATA: NG tube placement verification.

EXAM:
PORTABLE ABDOMEN - 1 VIEW

[abdomen]
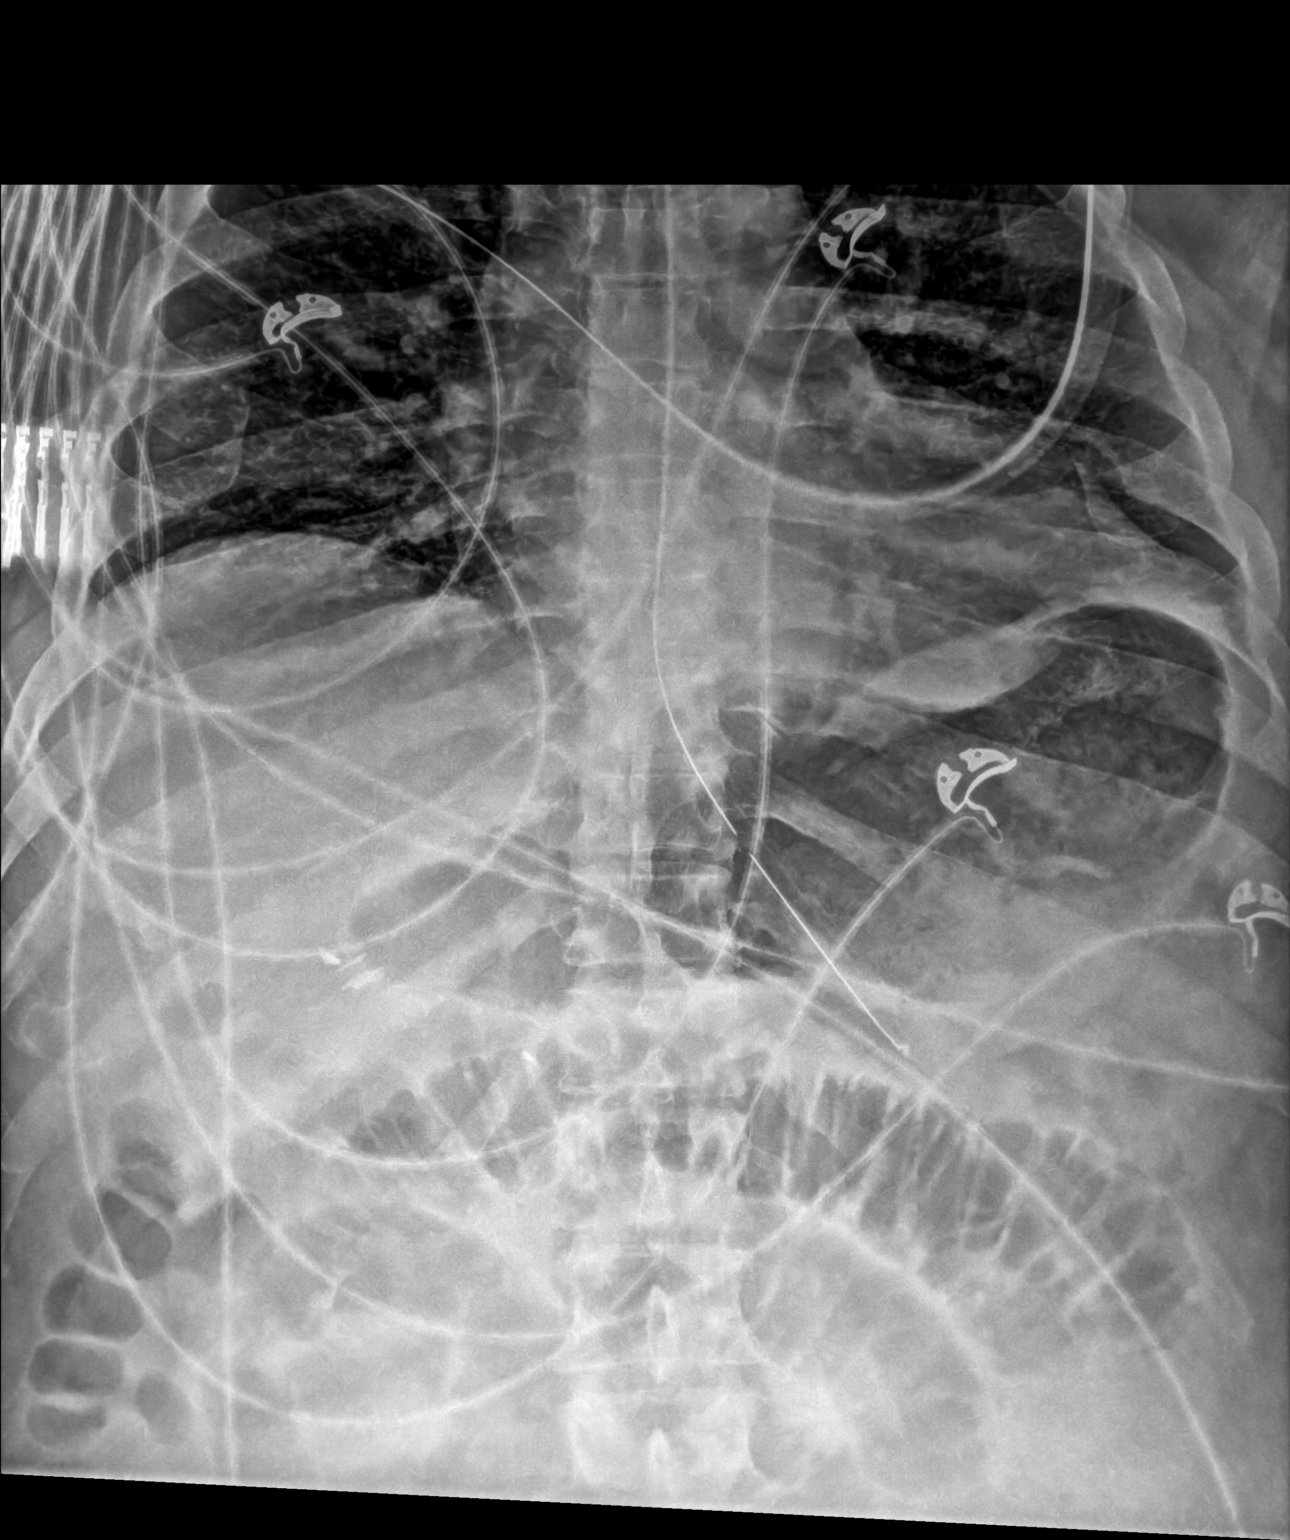

[1 of 1 positions shown; findings below may reference images not displayed]

FINDINGS: Dilated small bowel loop measuring up to 4.3 cm concerning for bowel
obstruction/ileus. NG tube with side port just below the GE junction
and tip of the tube overlying the body of the stomach. Lung bases
are clear.
IMPRESSION: NG tube in satisfactory position. Dilated small bowel loops
measuring up to 4.3 cm concerning for ileus/obstruction.

## 2023-04-27 IMAGING — DX DG ABD PORTABLE 1V
1 series · 1 of 1 positions shown · non-contrast
Comparison: 06/06/2021 and older exams.

CLINICAL DATA: NG tube placement.

EXAM:
PORTABLE ABDOMEN - 1 VIEW

[abdomen kub]
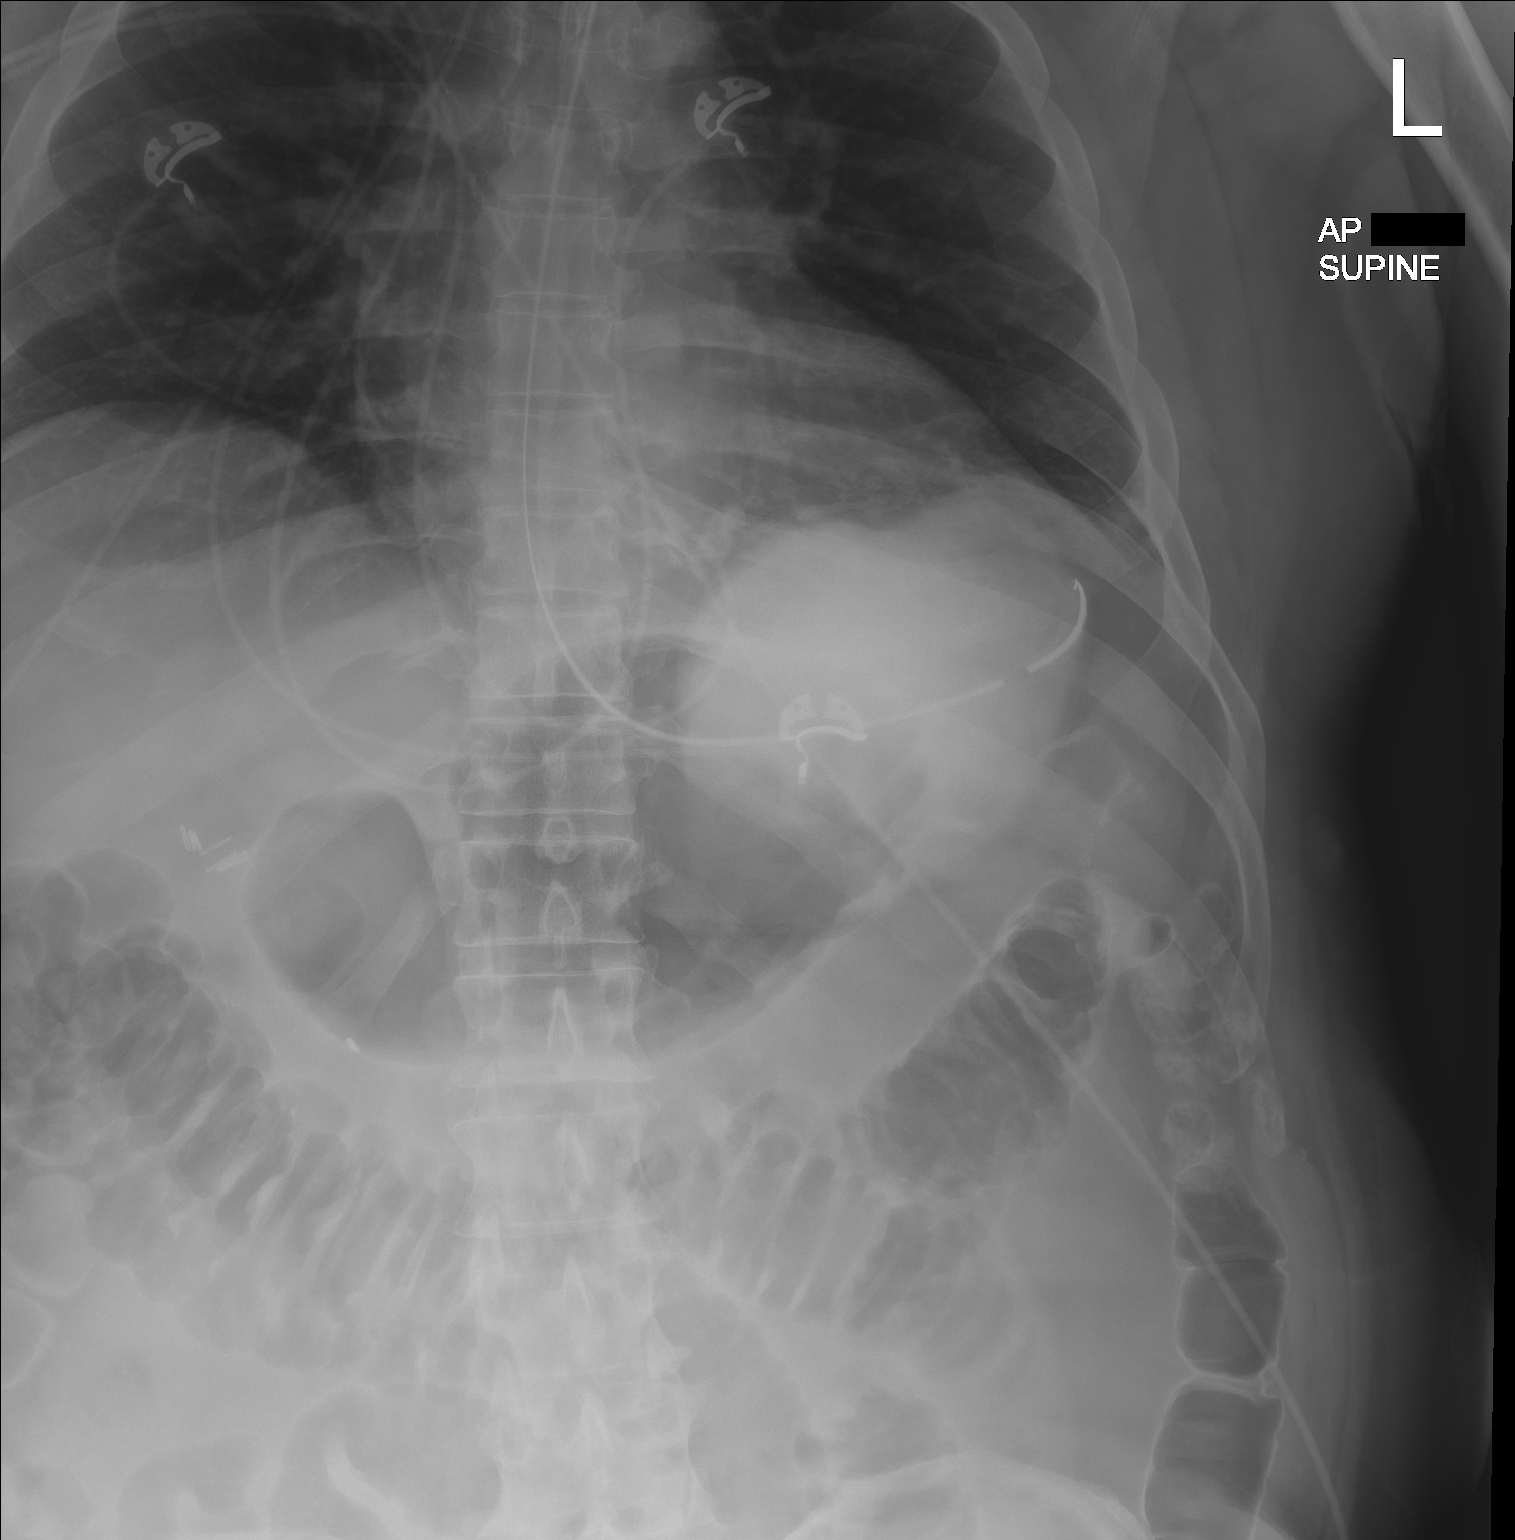

[1 of 1 positions shown; findings below may reference images not displayed]

FINDINGS: Nasal/orogastric tube passes below the diaphragm, tip in the left
upper quadrant consistent with positioning in the gastric fundus.

Residual contrast noted in the proximal stomach. Small-bowel
dilation noted in the upper abdomen similar to the previous day's
study.
IMPRESSION: 1. Well-positioned nasal/orogastric tube.
2. Persistent small dilation.

## 2023-04-27 IMAGING — DX DG ABD PORTABLE 1V
1 series · 1 of 1 positions shown · non-contrast
Comparison: Radiographs earlier today.  CT yesterday.

CLINICAL DATA: Small bowel obstruction, 8 hour delay.

EXAM:
PORTABLE ABDOMEN - 1 VIEW

[abdomen kub]
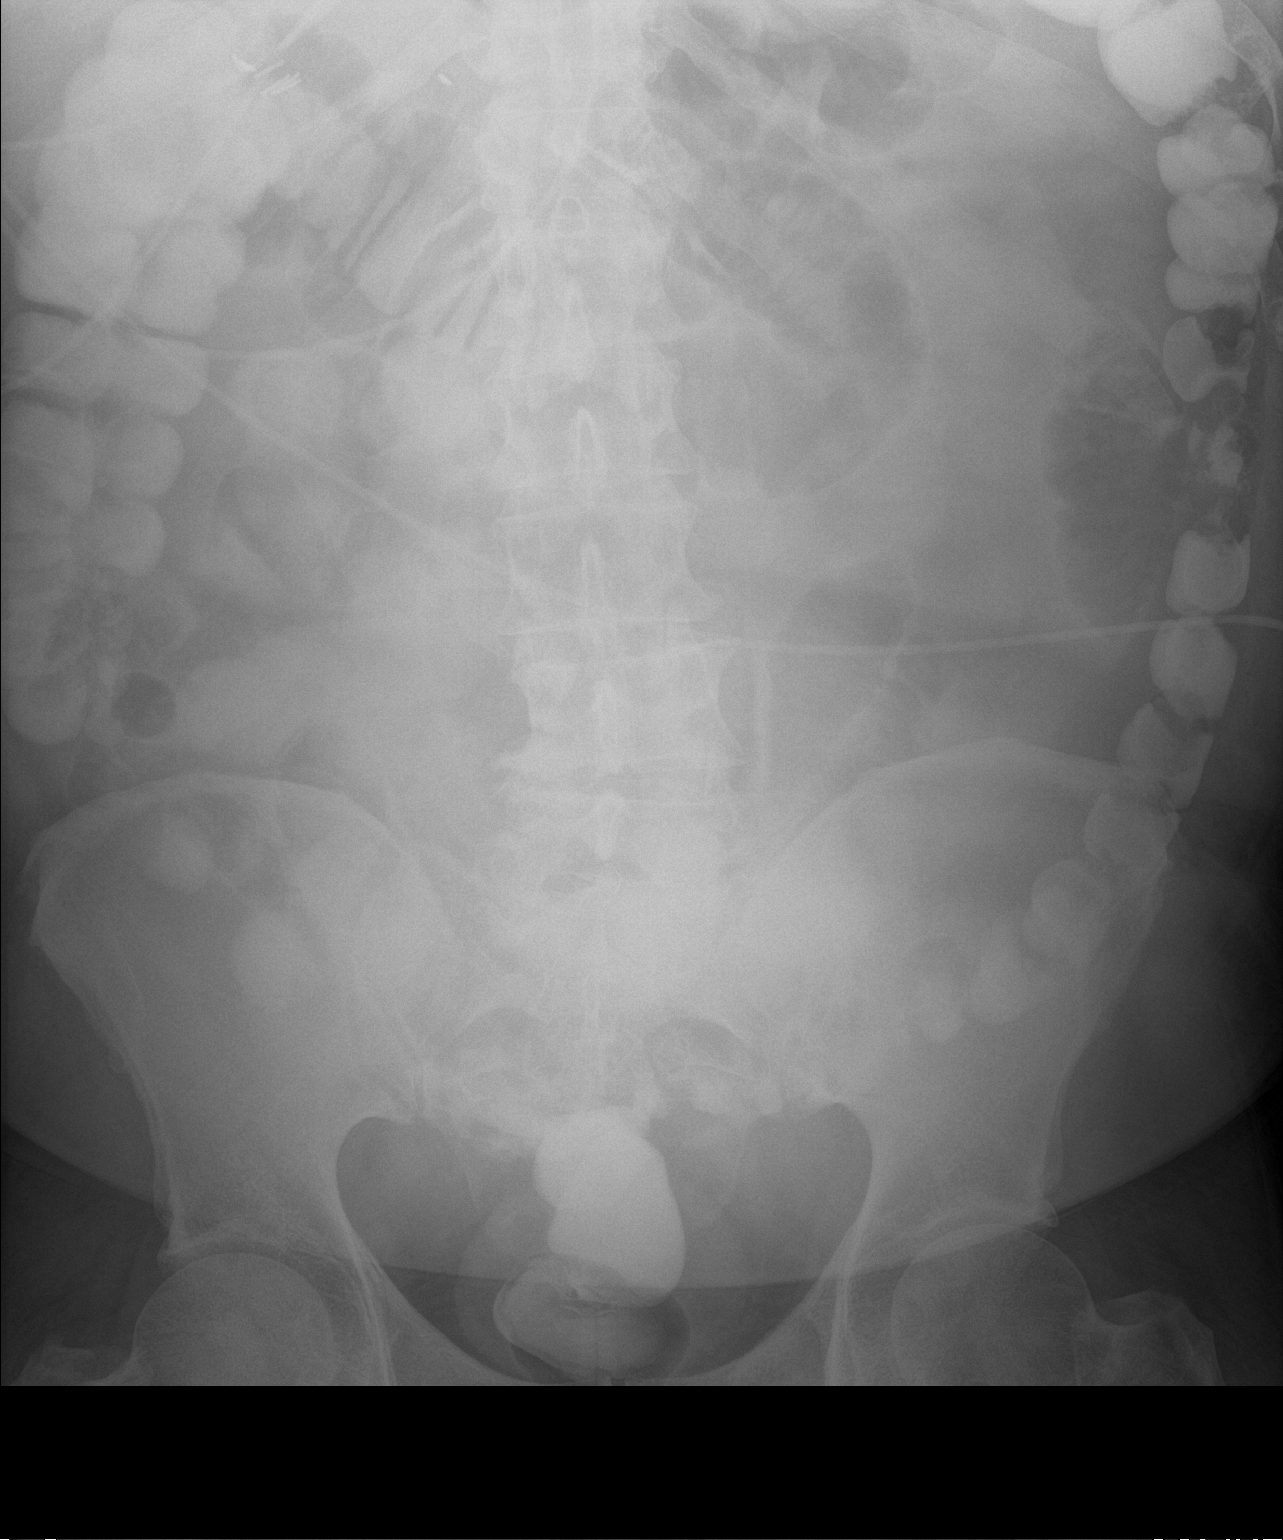

[1 of 1 positions shown; findings below may reference images not displayed]

FINDINGS: Enteric contrast is seen in the ascending, transverse, descending
and rectosigmoid colon. Mild persisting gaseous distention of small
bowel in the central abdomen.
IMPRESSION: Enteric contrast throughout the colon. Mild persisting gaseous
distention of small bowel in the central abdomen. Findings
consistent with either resolving or partial small bowel obstruction.

## 2023-05-17 ENCOUNTER — Ambulatory Visit: Payer: BC Managed Care – PPO | Attending: Cardiovascular Disease | Admitting: Cardiovascular Disease

## 2023-05-17 VITALS — BP 98/66 | HR 71 | Ht 68.0 in | Wt 185.0 lb

## 2023-05-17 DIAGNOSIS — I7121 Aneurysm of the ascending aorta, without rupture: Secondary | ICD-10-CM | POA: Diagnosis not present

## 2023-05-17 DIAGNOSIS — I10 Essential (primary) hypertension: Secondary | ICD-10-CM

## 2023-05-17 DIAGNOSIS — Q2381 Bicuspid aortic valve: Secondary | ICD-10-CM | POA: Diagnosis not present

## 2023-05-17 DIAGNOSIS — E785 Hyperlipidemia, unspecified: Secondary | ICD-10-CM | POA: Diagnosis not present

## 2023-05-17 NOTE — Patient Instructions (Signed)
Medication Instructions:  No changes *If you need a refill on your cardiac medications before your next appointment, please call your pharmacy*   Lab Work: None ordered If you have labs (blood work) drawn today and your tests are completely normal, you will receive your results only by: MyChart Message (if you have MyChart) OR A paper copy in the mail If you have any lab test that is abnormal or we need to change your treatment, we will call you to review the results.   Testing/Procedures: None ordered   Follow-Up: At [facility], you and your health needs are our priority.  As part of our continuing mission to provide you with exceptional heart care, we have created designated Provider Care Teams.  These Care Teams include your primary Cardiologist (physician) and Advanced Practice Providers (APPs -  Physician Assistants and Nurse Practitioners) who all work together to provide you with the care you need, when you need it.  We recommend signing up for the patient portal called "MyChart".  Sign up information is provided on this After Visit Summary.  MyChart is used to connect with patients for Virtual Visits (Telemedicine).  Patients are able to view lab/test results, encounter notes, upcoming appointments, etc.  Non-urgent messages can be sent to your provider as well.   To learn more about what you can do with MyChart, go to [email or web address].    Your next appointment:   12 month(s)  Provider:   Muhammad Arida, MD       

## 2023-05-17 NOTE — Progress Notes (Signed)
Cardiology Office Note   Date:  05/17/2023   ID:  Jonathan Burke, DOB [birth year], MRN [id number]  PCP:  Dettinger, Elige Radon, MD  Cardiologist:   Lorine Bears, MD   Chief Complaint  Patient presents with   Follow-up    12 months.   Headache    Bending and standing.       History of Present Illness: Jonathan Burke is a 62 y.o. male who presents for a follow-up visit regarding mild coronary atherosclerosis, bicuspid aortic valve and ascending aortic aneurysm. He has known history of essential hypertension.  There is family history of sudden death involving his father and brother. He had cardiac evaluation in February,2020 with CTA of the coronary arteries.  Coronary calcium score was 74.  There was mild nonobstructive LAD disease that was not significant by FFR.  There was incidental finding of ascending aortic aneurysm measured 4.5 cm.   The notes indicate that he underwent genetic testing for aortopathy which was negative.  Echocardiogram showed an EF was 50 to 55%.  Mild aortic valve calcifications were noted. He is not a smoker.  He did not tolerate rosuvastatin or atorvastatin due to severe myalgia.  He is currently on pravastatin.    A transesophageal echocardiogram was done in June 2021 which showed an EF of 60 to 65%, bicuspid aortic valve with fusion of the right and left coronary cusps with mild aortic valve calcifications without significant stenosis.  The ascending aorta measured 48 mm.  The patient has been following with Dr. Laneta Simmers on a yearly basis with stable size thoracic aortic aneurysm around 4.5 cm.  Lexiscan Myoview in July 2022 showed no evidence of ischemia with normal ejection fraction.  He has been doing extremely well with no chest pain, shortness of breath or palpitations.  He lost significant amount of weight with GLP-1 medications.  He is currently on Zepbound.  His blood pressure started going down with weight loss and thus losartan was  discontinued.    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   Chest pain    Complication of anesthesia    states is hard to wake up   Dental crowns present    also caps   Dilated aortic root (HCC)    Enlarged prostate    GERD (gastroesophageal reflux disease)    daily OTC   H/O hiatal hernia    pt unsure if hernia or not, but has a knot in his stomach   Headache(784.0)    tension   PONV (postoperative nausea and vomiting)    Sleep apnea    pt non compliant with CPAP usage   Small bowel obstruction (HCC) 07/17/2012    Past Surgical History:  Procedure Laterality Date   APPENDECTOMY     CARPAL TUNNEL RELEASE  09/09/2011   Procedure: CARPAL TUNNEL RELEASE;  Surgeon: Wyn Forster., MD;  Location: [facility];  Service: Orthopedics;  Laterality: Left;   CHOLECYSTECTOMY     DIRECT LARYNGOSCOPY  07/07/2001   suspension microdirect laryngoscopy with exc. left vocal cord mass   ESOPHAGOGASTRODUODENOSCOPY N/A 06/05/2013   Procedure: ESOPHAGOGASTRODUODENOSCOPY (EGD);  Surgeon: Vertell Novak., MD;  Location: [facility] ENDOSCOPY;  Service: Endoscopy;  Laterality: N/A;   LAPAROSCOPIC LYSIS OF ADHESIONS N/A 05/29/2013   Procedure: DIAGNOSTIC LAPAROSCOPY CONVERTED TO EXPLORATORY LAPAROTOMY, LYSIS OF ADHESIONS ;  Surgeon:  Shelly Rubenstein, MD;  Location: [facility] OR;  Service: General;  Laterality: N/A;   LUMBAR LAMINECTOMY/DECOMPRESSION MICRODISCECTOMY  10/29/1999   L5-S1   REPLACEMENT TOTAL KNEE Right 2020   SHOULDER SURGERY     left   TEE WITHOUT CARDIOVERSION N/A 11/05/2019   Procedure: TRANSESOPHAGEAL ECHOCARDIOGRAM (TEE);  Surgeon: Iran Ouch, MD;  Location: [facility] ORS;  Service: Cardiovascular;  Laterality: N/A;     Current Outpatient Medications  Medication Sig Dispense Refill   Acetaminophen 500 MG capsule Take 500-1,000 mg by mouth every 6 (six) hours as needed for pain  (or headaches).     ARTIFICIAL TEARS PF 0.1-0.3 % SOLN Place 1 drop into both eyes 3 (three) times daily as needed (for dryness).     aspirin EC 81 MG tablet Take 81 mg by mouth daily.     cetirizine (ZYRTEC) 10 MG tablet Take 1 tablet (10 mg total) by mouth daily. 90 tablet 1   Cholecalciferol (VITAMIN D3) 50 MCG (2000 UT) TABS Take 2,000 Units by mouth daily.     ondansetron (ZOFRAN-ODT) 4 MG disintegrating tablet Take 1 tablet (4 mg total) by mouth every 8 (eight) hours as needed for nausea or vomiting. 10 tablet 0   pantoprazole (PROTONIX) 20 MG tablet Take 1 tablet (20 mg total) by mouth 2 (two) times daily. 60 tablet 3   pravastatin (PRAVACHOL) 20 MG tablet Take 1 tablet (20 mg total) by mouth daily. 90 tablet 3   sucralfate (CARAFATE) 1 g tablet Take 1 tablet (1 g total) by mouth 4 (four) times daily -  with meals and at bedtime. 120 tablet 1   tirzepatide (ZEPBOUND) 12.5 MG/0.5ML Pen Inject 12.5 mg into the skin once a week. 6 mL 1   vitamin B-12 (CYANOCOBALAMIN) 500 MCG tablet Take 500-1,000 mcg by mouth daily.     vitamin C (ASCORBIC ACID) 500 MG tablet Take 500 mg by mouth daily.     zinc gluconate 50 MG tablet Take 50 mg by mouth daily.     No current facility-administered medications for this visit.    Allergies:   Adhesive [tape] and Atorvastatin    Social History:  The patient  reports that he has never smoked. He has never used smokeless tobacco. He reports that he does not drink alcohol and does not use drugs.   Family History:  The patient's family history includes Anuerysm in his sister; Arthritis in his sister and sister; COPD in his mother; Heart attack in his father; Heart disease in his mother; Hypertension in his brother and sister; Rectal cancer in his mother; Stroke in his sister.    ROS:  Please see the history of present illness.   Otherwise, review of systems are positive for none.   All other systems are reviewed and negative.    PHYSICAL EXAM: VS:  BP 98/66  (BP Location: Left Arm, Patient Position: Sitting, Cuff Size: Normal)   Pulse 71   Ht 5\' 8"  (1.727 m)   Wt 185 lb (83.9 kg)   BMI 28.13 kg/m  , BMI Body mass index is 28.13 kg/m. GEN: Well nourished, well developed, in no acute distress  HEENT: normal  Neck: no JVD, carotid bruits, or masses Cardiac: RRR; no murmurs, rubs, or gallops,no edema  Respiratory:  clear to auscultation bilaterally, normal work of breathing GI: soft, nontender, nondistended, + BS MS: no deformity or atrophy  Skin: warm and dry, no rash Neuro:  Strength and sensation are intact Psych: euthymic mood, full  affect   EKG:  EKG is ordered today. EKG showed normal sinus rhythm with no significant ST or T wave changes.   Recent Labs: 08/16/2022: ALT 35 12/27/2022: BUN 10; Creatinine, Ser 0.72; Hemoglobin 14.2; Platelets 267; Potassium 4.7; Sodium 140    Lipid Panel    Component Value Date/Time   CHOL 126 06/24/2022 0945   CHOL 155 09/05/2012 0925   TRIG 71 06/24/2022 0945   TRIG 68 09/05/2012 0925   HDL 43 06/24/2022 0945   HDL 43 09/05/2012 0925   CHOLHDL 2.9 06/24/2022 0945   LDLCALC 68 06/24/2022 0945   LDLCALC 98 09/05/2012 0925      Wt Readings from Last 3 Encounters:  05/17/23 185 lb (83.9 kg)  04/14/23 184 lb (83.5 kg)  12/14/22 196 lb 12.8 oz (89.3 kg)           No data to display            ASSESSMENT AND PLAN:  1. Ascending aortic aneurysm: Most recent CTA showed stable size at 4.5 cm with plans to repeat CT in April of next year.  Continue to follow-up with Dr. Laneta Simmers.    Continue to avoid heavy lifting of more than 20 pounds and avoid fluoroquinolones antibiotics.    2. Essential hypertension: His blood pressure normalized with weight loss and thus he is off all antihypertensive medications.  3. Hyperlipidemia: He did not tolerate atorvastatin or rosuvastatin in the past due to myalgia but he is tolerating pravastatin which will be continued.  He is tolerating pravastatin.   Most recent lipid profile showed an LDL of 68.  4.  Bicuspid aortic valve: This was not associated with significant regurgitation or stenosis.  No murmurs are noted by physical exam.  Will consider an echocardiogram next year.  5.  Obesity: He lost significant amount of weight with Zepbound.    Disposition:   FU with me in 12 months  Signed,  Lorine Bears, MD  05/17/2023 8:35 AM    [facility]

## 2023-07-04 ENCOUNTER — Other Ambulatory Visit: Payer: Self-pay | Admitting: Family Medicine

## 2023-07-25 NOTE — Progress Notes (Unsigned)
 Cardiology Office Note   Date:  07/26/2023   ID:  Jonathan Burke, Jonathan Burke 29-Oct-1960, MRN [id number]  PCP:  Dettinger, Elige Radon, MD  Cardiologist:   Lorine Bears, MD   No chief complaint on file.      History of Present Illness: Jonathan Burke is a 63 y.o. male who presents for a follow-up visit regarding mild coronary atherosclerosis, bicuspid aortic valve and ascending aortic aneurysm. He has known history of essential hypertension.  There is family history of sudden death involving his father and brother. He had cardiac evaluation in February,2020 with CTA of the coronary arteries.  Coronary calcium score was 74.  There was mild nonobstructive LAD disease that was not significant by FFR.  There was incidental finding of ascending aortic aneurysm measured 4.5 cm.   The notes indicate that he underwent genetic testing for aortopathy which was negative.  Echocardiogram showed an EF was 50 to 55%.  Mild aortic valve calcifications were noted. He is not a smoker.  He did not tolerate rosuvastatin or atorvastatin due to severe myalgia.  He is currently on pravastatin.    A transesophageal echocardiogram was done in June 2021 which showed an EF of 60 to 65%, bicuspid aortic valve with fusion of the right and left coronary cusps with mild aortic valve calcifications without significant stenosis.  The ascending aorta measured 48 mm.  The patient has been following with Dr. Laneta Simmers on a yearly basis with stable size thoracic aortic aneurysm around 4.5 cm.  Lexiscan Myoview in July 2022 showed no evidence of ischemia with normal ejection fraction.  He was most recently seen in December and was doing well at that time with no chest pain or shortness of breath.  He has lost significant weight with Zepbound and was feeling great. However, he returns now with his wife with complaints of substernal chest pain described as tightness feeling with left arm numbness and tingling.  These symptoms have been  present over the last few weeks.  Chest pain lasts for few minutes and can happen anytime both at rest and with exertion.  He has minimal shortness of breath and no other cardiac symptoms.    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   Chest pain    Complication of anesthesia    states is hard to wake up   Dental crowns present    also caps   Dilated aortic root (HCC)    Enlarged prostate    GERD (gastroesophageal reflux disease)    daily OTC   H/O hiatal hernia    pt unsure if hernia or not, but has a knot in his stomach   Headache(784.0)    tension   PONV (postoperative nausea and vomiting)    Sleep apnea    pt non compliant with CPAP usage   Small bowel obstruction (HCC) 07/17/2012    Past Surgical History:  Procedure Laterality Date   APPENDECTOMY     CARPAL TUNNEL RELEASE  09/09/2011   Procedure: CARPAL TUNNEL RELEASE;  Surgeon: Wyn Forster., MD;  Location: [facility];  Service: Orthopedics;  Laterality: Left;   CHOLECYSTECTOMY     DIRECT LARYNGOSCOPY  07/07/2001   suspension microdirect laryngoscopy with exc. left vocal cord mass   ESOPHAGOGASTRODUODENOSCOPY N/A 06/05/2013   Procedure: ESOPHAGOGASTRODUODENOSCOPY (EGD);  Surgeon: Vertell Novak., MD;  Location: [facility];  Service: Endoscopy;  Laterality: N/A;   LAPAROSCOPIC LYSIS OF ADHESIONS N/A 05/29/2013   Procedure: DIAGNOSTIC LAPAROSCOPY CONVERTED TO EXPLORATORY LAPAROTOMY, LYSIS OF ADHESIONS ;  Surgeon: Shelly Rubenstein, MD;  Location: [facility] OR;  Service: General;  Laterality: N/A;   LUMBAR LAMINECTOMY/DECOMPRESSION MICRODISCECTOMY  10/29/1999   L5-S1   REPLACEMENT TOTAL KNEE Right 2020   SHOULDER SURGERY     left   TEE WITHOUT CARDIOVERSION N/A 11/05/2019   Procedure: TRANSESOPHAGEAL ECHOCARDIOGRAM (TEE);  Surgeon: Iran Ouch, MD;  Location: [facility];  Service: Cardiovascular;  Laterality:  N/A;     Current Outpatient Medications  Medication Sig Dispense Refill   Acetaminophen 500 MG capsule Take 500-1,000 mg by mouth every 6 (six) hours as needed for pain (or headaches).     ARTIFICIAL TEARS PF 0.1-0.3 % SOLN Place 1 drop into both eyes 3 (three) times daily as needed (for dryness).     aspirin EC 81 MG tablet Take 81 mg by mouth daily.     cetirizine (ZYRTEC) 10 MG tablet Take 1 tablet (10 mg total) by mouth daily. 90 tablet 1   Cholecalciferol (VITAMIN D3) 50 MCG (2000 UT) TABS Take 2,000 Units by mouth daily.     ondansetron (ZOFRAN-ODT) 4 MG disintegrating tablet Take 1 tablet (4 mg total) by mouth every 8 (eight) hours as needed for nausea or vomiting. 10 tablet 0   pantoprazole (PROTONIX) 20 MG tablet Take 1 tablet (20 mg total) by mouth 2 (two) times daily. 60 tablet 3   pravastatin (PRAVACHOL) 20 MG tablet Take 1 tablet (20 mg total) by mouth daily. 90 tablet 3   vitamin B-12 (CYANOCOBALAMIN) 500 MCG tablet Take 500-1,000 mcg by mouth daily.     vitamin C (ASCORBIC ACID) 500 MG tablet Take 500 mg by mouth daily.     ZEPBOUND 12.5 MG/0.5ML Pen INJECT 12.5MG  INTO THE SKIN ONCE A WEEK 6 mL 1   zinc gluconate 50 MG tablet Take 50 mg by mouth daily.     sucralfate (CARAFATE) 1 g tablet Take 1 tablet (1 g total) by mouth 4 (four) times daily -  with meals and at bedtime. (Patient not taking: Reported on 07/26/2023) 120 tablet 1   No current facility-administered medications for this visit.    Allergies:   Adhesive [tape] and Atorvastatin    Social History:  The patient  reports that he has never smoked. He has never used smokeless tobacco. He reports that he does not drink alcohol and does not use drugs.   Family History:  The patient's family history includes Anuerysm in his sister; Arthritis in his sister and sister; COPD in his mother; Heart attack in his father; Heart disease in his mother; Hypertension in his brother and sister; Rectal cancer in his mother; Stroke in  his sister.    ROS:  Please see the history of present illness.   Otherwise, review of systems are positive for none.   All other systems are reviewed and negative.    PHYSICAL EXAM: VS:  BP 116/76   Pulse 77   Ht 5\' 8"  (1.727 m)   Wt 184 lb 9.6 oz (83.7 kg)   SpO2 98%   BMI 28.07 kg/m  , BMI Body mass index is 28.07 kg/m. GEN: Well nourished, well developed, in no acute distress  HEENT: normal  Neck: no JVD, carotid bruits, or masses Cardiac: RRR; no murmurs, rubs, or gallops,no edema  Respiratory:  clear to auscultation bilaterally, normal  work of breathing GI: soft, nontender, nondistended, + BS MS: no deformity or atrophy  Skin: warm and dry, no rash Neuro:  Strength and sensation are intact Psych: euthymic mood, full affect   EKG:  EKG is ordered today. EKG showed normal sinus rhythm with no significant ST or T wave changes.   Recent Labs: 08/16/2022: ALT 35 12/27/2022: BUN 10; Creatinine, Ser 0.72; Hemoglobin 14.2; Platelets 267; Potassium 4.7; Sodium 140    Lipid Panel    Component Value Date/Time   CHOL 126 06/24/2022 0945   CHOL 155 09/05/2012 0925   TRIG 71 06/24/2022 0945   TRIG 68 09/05/2012 0925   HDL 43 06/24/2022 0945   HDL 43 09/05/2012 0925   CHOLHDL 2.9 06/24/2022 0945   LDLCALC 68 06/24/2022 0945   LDLCALC 98 09/05/2012 0925      Wt Readings from Last 3 Encounters:  07/26/23 184 lb 9.6 oz (83.7 kg)  05/17/23 185 lb (83.9 kg)  04/14/23 184 lb (83.5 kg)           No data to display            ASSESSMENT AND PLAN:  1.  Precordial chest pain: Concerning for a cardiac etiology.  Previous cardiac CTA in 2020 showed mild stenosis in the LAD.  Recommend repeat cardiac CTA for evaluation.  2.  Ascending aortic aneurysm: Most recent CTA showed stable size at 4.5 cm .  This will be evaluated with the upcoming cardiac CTA and thus he will not need a repeat imaging in April.    3. Essential hypertension: His blood pressure normalized with  weight loss and thus he is off all antihypertensive medications.  4. Hyperlipidemia: He did not tolerate atorvastatin or rosuvastatin in the past due to myalgia but he is tolerating pravastatin which will be continued.  He is tolerating pravastatin.  Most recent lipid profile showed an LDL of 68.  5.  Bicuspid aortic valve: This was not associated with significant regurgitation or stenosis.  This has not been evaluated in the last 5 years.  I requested a follow-up echocardiogram given his recent cardiac symptoms.  6.  Obesity: He lost significant amount of weight with Zepbound.    Disposition:   FU after cardiac testing if abnormal and otherwise in 12 months  Signed,  Lorine Bears, MD  07/26/2023 8:31 AM    [facility]

## 2023-07-26 ENCOUNTER — Other Ambulatory Visit: Payer: Self-pay | Admitting: Cardiovascular Disease

## 2023-07-26 ENCOUNTER — Ambulatory Visit: Payer: BC Managed Care – PPO | Attending: Cardiovascular Disease | Admitting: Cardiovascular Disease

## 2023-07-26 ENCOUNTER — Encounter: Payer: Self-pay | Admitting: Cardiovascular Disease

## 2023-07-26 VITALS — BP 116/76 | HR 77 | Ht 68.0 in | Wt 184.6 lb

## 2023-07-26 DIAGNOSIS — R072 Precordial pain: Secondary | ICD-10-CM | POA: Diagnosis not present

## 2023-07-26 DIAGNOSIS — R079 Chest pain, unspecified: Secondary | ICD-10-CM

## 2023-07-26 DIAGNOSIS — E785 Hyperlipidemia, unspecified: Secondary | ICD-10-CM

## 2023-07-26 DIAGNOSIS — Q2381 Bicuspid aortic valve: Secondary | ICD-10-CM | POA: Diagnosis not present

## 2023-07-26 DIAGNOSIS — I10 Essential (primary) hypertension: Secondary | ICD-10-CM

## 2023-07-26 DIAGNOSIS — I7121 Aneurysm of the ascending aorta, without rupture: Secondary | ICD-10-CM

## 2023-07-26 LAB — BASIC METABOLIC PANEL
BUN/Creatinine Ratio: 16 (ref 10–24)
BUN: 17 mg/dL (ref 8–27)
CO2: 25 mmol/L (ref 20–29)
Calcium: 9.5 mg/dL (ref 8.6–10.2)
Chloride: 102 mmol/L (ref 96–106)
Creatinine, Ser: 1.04 mg/dL (ref 0.76–1.27)
Glucose: 78 mg/dL (ref 70–99)
Potassium: 4.8 mmol/L (ref 3.5–5.2)
Sodium: 140 mmol/L (ref 134–144)
eGFR: 81 mL/min/{1.73_m2} (ref >59)

## 2023-07-26 MED ORDER — METOPROLOL TARTRATE 50 MG PO TABS: ORAL_TABLET | ORAL | 0 refills | Status: DC

## 2023-07-26 NOTE — Patient Instructions (Signed)
 Medication Instructions:  No changes *If you need a refill on your cardiac medications before your next appointment, please call your pharmacy*   Lab Work: Your provider would like for you to have the following labs today: BMET  If you have labs (blood work) drawn today and your tests are completely normal, you will receive your results only by: MyChart Message (if you have MyChart) OR A paper copy in the mail If you have any lab test that is abnormal or we need to change your treatment, we will call you to review the results.   Testing/Procedures: Your physician has requested that you have an echocardiogram. Echocardiography is a painless test that uses sound waves to create images of your heart. It provides your doctor with information about the size and shape of your heart and how well your heart's chambers and valves are working. You may receive an ultrasound enhancing agent through an IV if needed to better visualize your heart during the echo.This procedure takes approximately one hour. There are no restrictions for this procedure. This will take place at the [address].   Please note: We ask at that you not bring children with you during ultrasound (echo/ vascular) testing. Due to room size and safety concerns, children are not allowed in the ultrasound rooms during exams. Our front office staff cannot provide observation of children in our lobby area while testing is being conducted. An adult accompanying a patient to their appointment will only be allowed in the ultrasound room at the discretion of the ultrasound technician under special circumstances. We apologize for any inconvenience.    Follow-Up: At [facility], you and your health needs are our priority.  As part of our continuing mission to provide you with exceptional heart care, we have created designated Provider Care Teams.  These Care Teams include your primary Cardiologist (physician) and  Advanced Practice Providers (APPs -  Physician Assistants and Nurse Practitioners) who all work together to provide you with the care you need, when you need it.  We recommend signing up for the patient portal called "MyChart".  Sign up information is provided on this After Visit Summary.  MyChart is used to connect with patients for Virtual Visits (Telemedicine).  Patients are able to view lab/test results, encounter notes, upcoming appointments, etc.  Non-urgent messages can be sent to your provider as well.   To learn more about what you can do with MyChart, go to [email or web address].    Your next appointment:   12 month(s)  Provider:   Lorine Bears, MD     Other Instructions   Your cardiac CT will be scheduled at one of the below locations:   [facility] [address] [place], [place] [address] [phone]  OR  [facility] [address] [place], [place] [address] [phone]  OR   [facility] [address] [place], [place] [address] [phone]  OR   [facility] [address] [place], [place] [address] [phone]  If scheduled at [facility], please arrive at the [facility] and Children's Entrance (Entrance C2) of [facility] 30 minutes prior to test start time. You can use the FREE valet parking offered at entrance C (encouraged to control the heart rate for the test)  Proceed to the [facility] Radiology Department (first floor) to check-in and test prep.  All radiology patients and guests should use  entrance C2 at [facility], accessed from [facility], even though the hospital's physical address listed is [address].    If scheduled at [facility] or [facility], please arrive 15 mins early for check-in and test prep.  There is spacious parking and easy  access to the radiology department from the [facility] Heart and Vascular entrance. Please enter here and check-in with the desk attendant.   If scheduled at [facility], please arrive 30 minutes early for check-in and test prep.  Please follow these instructions carefully (unless otherwise directed):  An IV will be required for this test and Nitroglycerin will be given.  Hold all erectile dysfunction medications at least 3 days (72 hrs) prior to test. (Ie viagra, cialis, sildenafil, tadalafil, etc)   On the Night Before the Test: Be sure to Drink plenty of water. Do not consume any caffeinated/decaffeinated beverages or chocolate 12 hours prior to your test. Do not take any antihistamines 12 hours prior to your test.  On the Day of the Test: Drink plenty of water until 1 hour prior to the test. Do not eat any food 1 hour prior to test. You may take your regular medications prior to the test.  Take metoprolol (Lopressor) two hours prior to test. Patients who wear a continuous glucose monitor MUST remove the device prior to scanning.  After the Test: Drink plenty of water. After receiving IV contrast, you may experience a mild flushed feeling. This is normal. On occasion, you may experience a mild rash up to 24 hours after the test. This is not dangerous. If this occurs, you can take Benadryl 25 mg, Zyrtec, Claritin, or Allegra and increase your fluid intake. (Patients taking Tikosyn should avoid Benadryl, and may take Zyrtec, Claritin, or Allegra) If you experience trouble breathing, this can be serious. If it is severe call [phone] IMMEDIATELY. If it is mild, please call our office.  We will call to schedule your test 2-4 weeks out understanding that some insurance companies will need an authorization prior to the service being performed.   For more information and frequently asked questions, please visit our website : [email or web address]  For non-scheduling related  questions, please contact the cardiac imaging nurse navigator should you have any questions/concerns: Cardiac Imaging Nurse Navigators Direct Office Dial: [phone]   For scheduling needs, including cancellations and rescheduling, please call [place], [phone].

## 2023-08-09 ENCOUNTER — Encounter (HOSPITAL_COMMUNITY): Payer: Self-pay

## 2023-08-09 ENCOUNTER — Other Ambulatory Visit (HOSPITAL_COMMUNITY): Payer: Self-pay | Admitting: *Deleted

## 2023-08-09 DIAGNOSIS — I7121 Aneurysm of the ascending aorta, without rupture: Secondary | ICD-10-CM

## 2023-08-10 ENCOUNTER — Telehealth (HOSPITAL_COMMUNITY): Payer: Self-pay | Admitting: *Deleted

## 2023-08-10 NOTE — Telephone Encounter (Signed)
 Attempted to call patient regarding upcoming cardiac CT appointment. Left message on voicemail with name and callback number  Larey Brick RN Navigator Cardiac Imaging [facility] [phone] Office [phone] Cell

## 2023-08-11 ENCOUNTER — Ambulatory Visit (HOSPITAL_COMMUNITY)
Admission: RE | Admit: 2023-08-11 | Discharge: 2023-08-11 | Disposition: A | Discharge status: Home / Self Care | Payer: BC Managed Care – PPO | Source: Ambulatory Visit | Attending: Cardiovascular Disease | Admitting: Cardiovascular Disease

## 2023-08-11 DIAGNOSIS — I719 Aortic aneurysm of unspecified site, without rupture: Secondary | ICD-10-CM | POA: Diagnosis not present

## 2023-08-11 DIAGNOSIS — I700 Atherosclerosis of aorta: Secondary | ICD-10-CM | POA: Diagnosis not present

## 2023-08-11 DIAGNOSIS — Z8679 Personal history of other diseases of the circulatory system: Secondary | ICD-10-CM | POA: Diagnosis not present

## 2023-08-11 DIAGNOSIS — I7121 Aneurysm of the ascending aorta, without rupture: Secondary | ICD-10-CM | POA: Diagnosis not present

## 2023-08-11 DIAGNOSIS — R072 Precordial pain: Secondary | ICD-10-CM | POA: Diagnosis not present

## 2023-08-11 MED ORDER — METOPROLOL TARTRATE 5 MG/5ML IV SOLN
5.0000 mg | INTRAVENOUS | Status: DC | PRN
Start: 2023-08-11 — End: 2023-08-12
  Administered 2023-08-11: 5 mg via INTRAVENOUS

## 2023-08-11 MED ORDER — NITROGLYCERIN 0.4 MG SL SUBL
SUBLINGUAL_TABLET | SUBLINGUAL | Status: AC
  Filled 2023-08-11: qty 2

## 2023-08-11 MED ORDER — METOPROLOL TARTRATE 5 MG/5ML IV SOLN
INTRAVENOUS | Status: AC
Start: 2023-08-11 — End: ?
  Filled 2023-08-11: qty 15

## 2023-08-11 MED ORDER — IOHEXOL 350 MG/ML SOLN
95.0000 mL | Freq: Once | INTRAVENOUS | Status: AC | PRN
  Administered 2023-08-11: 95 mL via INTRAVENOUS

## 2023-08-11 MED ORDER — NITROGLYCERIN 0.4 MG SL SUBL
0.8000 mg | SUBLINGUAL_TABLET | Freq: Once | SUBLINGUAL | Status: AC
  Administered 2023-08-11: 0.8 mg via SUBLINGUAL

## 2023-08-17 ENCOUNTER — Ambulatory Visit (HOSPITAL_COMMUNITY): Payer: BC Managed Care – PPO | Attending: Cardiovascular Disease

## 2023-08-17 DIAGNOSIS — I10 Essential (primary) hypertension: Secondary | ICD-10-CM

## 2023-08-17 DIAGNOSIS — Q2381 Bicuspid aortic valve: Secondary | ICD-10-CM | POA: Diagnosis not present

## 2023-08-17 LAB — ECHOCARDIOGRAM COMPLETE
AR max vel: 1.72 cm2
AV Area VTI: 1.71 cm2
AV Area mean vel: 1.66 cm2
AV Mean grad: 8.5 mmHg
AV Peak grad: 16.2 mmHg
Ao pk vel: 2.02 m/s
Area-P 1/2: 3.5 cm2
P 1/2 time: 448 ms
S' Lateral: 3 cm

## 2023-08-18 ENCOUNTER — Other Ambulatory Visit (HOSPITAL_COMMUNITY): Payer: Self-pay

## 2023-08-18 DIAGNOSIS — Q2381 Bicuspid aortic valve: Secondary | ICD-10-CM

## 2023-09-09 ENCOUNTER — Other Ambulatory Visit: Payer: Self-pay | Admitting: Cardiovascular Disease

## 2023-09-09 DIAGNOSIS — E785 Hyperlipidemia, unspecified: Secondary | ICD-10-CM

## 2023-09-12 ENCOUNTER — Other Ambulatory Visit: Payer: Self-pay | Admitting: Surgery

## 2023-09-12 DIAGNOSIS — I7121 Aneurysm of the ascending aorta, without rupture: Secondary | ICD-10-CM

## 2023-09-22 DIAGNOSIS — M25512 Pain in left shoulder: Secondary | ICD-10-CM | POA: Diagnosis not present

## 2023-09-22 DIAGNOSIS — M542 Cervicalgia: Secondary | ICD-10-CM | POA: Diagnosis not present

## 2023-09-22 DIAGNOSIS — Z01818 Encounter for other preprocedural examination: Secondary | ICD-10-CM | POA: Diagnosis not present

## 2023-09-22 DIAGNOSIS — M5412 Radiculopathy, cervical region: Secondary | ICD-10-CM | POA: Diagnosis not present

## 2023-09-30 NOTE — Progress Notes (Deleted)
 [address]       [place] [address]             [phone]        PAXON CAVITT [id number] 1960/11/27   History of Present Illness:  Jonathan Burke is a 63 yo male patient is a 63 year old gentleman with a functionally bicuspid aortic valve without stenosis or insufficiency who has a 4.5 to 4.7 cm fusiform ascending aortic aneurysm by CT and echocardiogram dating back to 04/2018. He was last evaluated by Dr. Sherene Dilling in May 2024 at which time it measured 4.5 cm.  He presents today for annual surveillance.       Current Outpatient Medications on File Prior to Visit  Medication Sig Dispense Refill   Acetaminophen  500 MG capsule Take 500-1,000 mg by mouth every 6 (six) hours as needed for pain (or headaches).     ARTIFICIAL TEARS PF 0.1-0.3 % SOLN Place 1 drop into both eyes 3 (three) times daily as needed (for dryness).     aspirin EC 81 MG tablet Take 81 mg by mouth daily.     cetirizine  (ZYRTEC ) 10 MG tablet Take 1 tablet (10 mg total) by mouth daily. 90 tablet 1   Cholecalciferol (VITAMIN D3) 50 MCG (2000 UT) TABS Take 2,000 Units by mouth daily.     metoprolol  tartrate (LOPRESSOR ) 50 MG tablet Take one tablet two hours prior to the test 1 tablet 0   ondansetron  (ZOFRAN -ODT) 4 MG disintegrating tablet Take 1 tablet (4 mg total) by mouth every 8 (eight) hours as needed for nausea or vomiting. 10 tablet 0   pantoprazole  (PROTONIX ) 20 MG tablet TAKE ONE (1) TABLET BY MOUTH TWO (2) TIMES DAILY 60 tablet 6   pravastatin  (PRAVACHOL ) 20 MG tablet TAKE ONE (1) TABLET BY MOUTH EVERY DAY 90 tablet 3   sucralfate  (CARAFATE ) 1 g tablet Take 1 tablet (1 g total) by mouth 4 (four) times daily -  with meals and at bedtime. (Patient not taking: Reported on 07/26/2023) 120 tablet 1   vitamin B-12 (CYANOCOBALAMIN ) 500 MCG tablet Take 500-1,000 mcg by mouth daily.     vitamin C (ASCORBIC ACID) 500 MG tablet Take 500 mg by mouth daily.     ZEPBOUND  12.5 MG/0.5ML Pen INJECT 12.5MG   INTO THE SKIN ONCE A WEEK 6 mL 1   zinc gluconate 50 MG tablet Take 50 mg by mouth daily.     No current facility-administered medications on file prior to visit.     There were no vitals taken for this visit.  Physical Exam  CTA Results:   CLINICAL DATA:  63 year old male with history of Aortic aneurysm suspected   EXAM: CT ANGIOGRAPHY CHEST WITH CONTRAST   TECHNIQUE: Multidetector CT imaging of the chest was performed using the standard protocol during bolus administration of intravenous contrast. Multiplanar CT image reconstructions and MIPs were obtained to evaluate the vascular anatomy.   RADIATION DOSE REDUCTION: This exam was performed according to the departmental dose-optimization program which includes automated exposure control, adjustment of the mA and/or kV according to patient size and/or use of iterative reconstruction technique.   CONTRAST:  95mL OMNIPAQUE  IOHEXOL  350 MG/ML SOLN   COMPARISON:  09/29/2022   FINDINGS: Cardiovascular:   Heart:   No cardiomegaly. No pericardial fluid/thickening. Calcifications of left main, left anterior descending coronary arteries.   Aorta:   Thickening of the aortic valve leaflets and calcifications compatible with the given history of bicuspid valve.  Greatest estimated diameter of the aortic annulus 26 mm on the coronal reformatted images.   Greatest estimated diameter of the sino-tubular junction, 34 mm on the coronal images.   Greatest estimated diameter of the ascending aorta on the axial images, 44 mm.   Mild atherosclerotic changes of the aorta. Branch vessels are patent with a 3 vessel arch.   No pedunculated plaque, ulcerated plaque, dissection, periaortic fluid. No wall thickening.   Pulmonary arteries:   Timing of the contrast bolus is not optimized for evaluation of pulmonary artery filling defects. Unremarkable size of the main pulmonary artery.   Mediastinum/Nodes: No mediastinal  adenopathy. Unremarkable appearance of the thoracic esophagus.   Unremarkable appearance of the thoracic inlet.   Lungs/Pleura: Central airways are clear. No pleural effusion. No confluent airspace disease.   No pneumothorax.   Upper Abdomen: No acute finding of the upper abdomen.   Musculoskeletal: No acute displaced fracture. Degenerative changes of the spine.   Review of the MIP images confirms the above findings.   IMPRESSION: Relatively unchanged size and configuration of the ascending aorta, estimated 4.4 cm on the current CT. Recommend annual imaging followup by CTA or MRA. This recommendation follows 2010 ACCF/AHA/AATS/ACR/ASA/SCA/SCAI/SIR/STS/SVM Guidelines for the Diagnosis and Management of Patients with Thoracic Aortic Disease. Circulation. 2010; 121: E454-U981. Aortic aneurysm NOS (ICD10-I71.9)   Morphologic changes of bicuspid aortic valve, known.   Aortic Atherosclerosis (ICD10-I70.0).   Signed,   Marciano Settles. Rexine Cater, RPVI   [facility]   [facility] Rad[facility]     Electronically Signed   By: Myrlene Asper D.O.   On: 08/27/2023 06:33   A/P:  S/P Ascending Aortic Aneurysm- remains clinically stable at 4.5 cm.. he will continue to require annual surveillance HLD- on Lipitor HTN- on Lopressor       Risk Modification:  Statin:  Yes  Smoking cessation instruction/counseling given:  {CHL AMB [facility] SMOKING CESSATION COUNSELING:20758}  Patient was counseled on importance of Blood Pressure Control.  Despite Medical intervention if the patient notices persistently elevated blood pressure readings.  They are instructed to contact their Primary Care Physician  Please avoid use of Fluoroquinolones as this can potentially increase your risk of Aortic Rupture and/or Dissection  Patient educated on signs and symptoms of Aortic Dissection, handout also provided in AVS  Misty Rago, PA-C 09/30/23

## 2023-10-03 DIAGNOSIS — M5412 Radiculopathy, cervical region: Secondary | ICD-10-CM | POA: Diagnosis not present

## 2023-10-05 ENCOUNTER — Other Ambulatory Visit

## 2023-10-05 DIAGNOSIS — M5412 Radiculopathy, cervical region: Secondary | ICD-10-CM | POA: Diagnosis not present

## 2023-10-05 DIAGNOSIS — M25512 Pain in left shoulder: Secondary | ICD-10-CM | POA: Diagnosis not present

## 2023-10-06 ENCOUNTER — Encounter (HOSPITAL_COMMUNITY): Payer: Self-pay

## 2023-10-12 ENCOUNTER — Ambulatory Visit

## 2023-10-13 ENCOUNTER — Encounter: Payer: Self-pay | Admitting: Family Medicine

## 2023-10-13 ENCOUNTER — Ambulatory Visit: Payer: BC Managed Care – PPO | Admitting: Family Medicine

## 2023-10-13 VITALS — BP 107/72 | HR 68 | Ht 68.0 in | Wt 184.0 lb

## 2023-10-13 DIAGNOSIS — R350 Frequency of micturition: Secondary | ICD-10-CM | POA: Diagnosis not present

## 2023-10-13 DIAGNOSIS — E785 Hyperlipidemia, unspecified: Secondary | ICD-10-CM

## 2023-10-13 DIAGNOSIS — I10 Essential (primary) hypertension: Secondary | ICD-10-CM

## 2023-10-13 DIAGNOSIS — N401 Enlarged prostate with lower urinary tract symptoms: Secondary | ICD-10-CM | POA: Diagnosis not present

## 2023-10-13 DIAGNOSIS — Z125 Encounter for screening for malignant neoplasm of prostate: Secondary | ICD-10-CM | POA: Diagnosis not present

## 2023-10-13 DIAGNOSIS — K219 Gastro-esophageal reflux disease without esophagitis: Secondary | ICD-10-CM

## 2023-10-13 DIAGNOSIS — E669 Obesity, unspecified: Secondary | ICD-10-CM

## 2023-10-13 DIAGNOSIS — G4733 Obstructive sleep apnea (adult) (pediatric): Secondary | ICD-10-CM

## 2023-10-13 MED ORDER — ZEPBOUND 15 MG/0.5ML ~~LOC~~ SOAJ: 15.0000 mg | SUBCUTANEOUS | 3 refills | Status: DC

## 2023-10-13 NOTE — Progress Notes (Signed)
 BP 107/72   Pulse 68   Ht 5\' 8"  (1.727 m)   Wt 184 lb (83.5 kg)   SpO2 98%   BMI 27.98 kg/m    Subjective:   Patient ID: Jonathan Burke, male    DOB: 1961/03/06, 63 y.o.   MRN: [id number]  HPI: Jonathan Burke is a 63 y.o. male presenting on 10/13/2023 for Medical Management of Chronic Issues, Hypertension, and dyslipidemia   HPI Hypertension Patient is currently on metoprolol , diet control, and their blood pressure today is 107/72. Patient denies any lightheadedness or dizziness. Patient denies headaches, blurred vision, chest pains, shortness of breath, or weakness. Denies any side effects from medication and is content with current medication.   Hyperlipidemia Patient is coming in for recheck of his hyperlipidemia. The patient is currently taking pravastatin . They deny any issues with myalgias or history of liver damage from it. They deny any focal numbness or weakness or chest pain.   GERD Patient is currently on pantoprazole .  She denies any major symptoms or abdominal pain or belching or burping. She denies any blood in her stool or lightheadedness or dizziness.   BPH Patient is coming in for recheck on BPH Symptoms: None Medication: None Last PSA: Been at least over a year, will check today  Obesity and weight management Patient is coming in for recheck for obesity and weight management.  He says his BMI is 27.9 and he weighs 184 pounds.  He would like his target to be around 175 and he is aiming for that.  He says he has done very well on the Zepbound  and has been on the same 12.5 mg for 7 or 8 months.  He says he is just not losing the weight at the end like he would like to, he has changed his diet and activity feels a lot healthier and feels a lot better than he did.  He has come off all blood pressure medicines during this time  As well.  Relevant past medical, surgical, family and social history reviewed and updated as indicated. Interim medical history since our last  visit reviewed. Allergies and medications reviewed and updated.  Review of Systems  Constitutional:  Negative for chills and fever.  Eyes:  Negative for visual disturbance.  Respiratory:  Negative for shortness of breath and wheezing.   Cardiovascular:  Negative for chest pain and leg swelling.  Musculoskeletal:  Negative for back pain and gait problem.  Skin:  Negative for rash.  Neurological:  Negative for dizziness.  All other systems reviewed and are negative.   Per HPI unless specifically indicated above   Allergies as of 10/13/2023       Reactions   Adhesive [tape] Other (See Comments)   Regular tape pulls OFF the skin!! Only paper tape is tolerated.   Atorvastatin  Other (See Comments)   Made the patient's bones hurt        Medication List        Accurate as of Oct 13, 2023  9:49 AM. If you have any questions, ask your nurse or doctor.          Acetaminophen  500 MG capsule Take 500-1,000 mg by mouth every 6 (six) hours as needed for pain (or headaches).   Artificial Tears PF 0.1-0.3 % Soln Generic drug: Dextran 70-Hypromellose (PF) Place 1 drop into both eyes 3 (three) times daily as needed (for dryness).   ascorbic acid 500 MG tablet Commonly known as: VITAMIN C Take 500 mg  by mouth daily.   aspirin EC 81 MG tablet Take 81 mg by mouth daily.   cetirizine  10 MG tablet Commonly known as: ZYRTEC  Take 1 tablet (10 mg total) by mouth daily.   cyanocobalamin  500 MCG tablet Commonly known as: VITAMIN B12 Take 500-1,000 mcg by mouth daily.   metoprolol  tartrate 50 MG tablet Commonly known as: LOPRESSOR  Take one tablet two hours prior to the test   ondansetron  4 MG disintegrating tablet Commonly known as: ZOFRAN -ODT Take 1 tablet (4 mg total) by mouth every 8 (eight) hours as needed for nausea or vomiting.   pantoprazole  20 MG tablet Commonly known as: PROTONIX  TAKE ONE (1) TABLET BY MOUTH TWO (2) TIMES DAILY   pravastatin  20 MG tablet Commonly  known as: PRAVACHOL  TAKE ONE (1) TABLET BY MOUTH EVERY DAY   sucralfate  1 g tablet Commonly known as: Carafate  Take 1 tablet (1 g total) by mouth 4 (four) times daily -  with meals and at bedtime.   Vitamin D3 50 MCG (2000 UT) Tabs Take 2,000 Units by mouth daily.   Zepbound  12.5 MG/0.5ML Pen Generic drug: tirzepatide  INJECT 12.5MG  INTO THE SKIN ONCE A WEEK   zinc gluconate 50 MG tablet Take 50 mg by mouth daily.         Objective:   BP 107/72   Pulse 68   Ht 5\' 8"  (1.727 m)   Wt 184 lb (83.5 kg)   SpO2 98%   BMI 27.98 kg/m   Wt Readings from Last 3 Encounters:  10/13/23 184 lb (83.5 kg)  07/26/23 184 lb 9.6 oz (83.7 kg)  05/17/23 185 lb (83.9 kg)    Physical Exam Vitals and nursing note reviewed.  Constitutional:      General: He is not in acute distress.    Appearance: He is well-developed. He is not diaphoretic.  Eyes:     General: No scleral icterus.    Conjunctiva/sclera: Conjunctivae normal.  Neck:     Thyroid : No thyromegaly.  Cardiovascular:     Rate and Rhythm: Normal rate and regular rhythm.     Heart sounds: Normal heart sounds. No murmur heard. Pulmonary:     Effort: Pulmonary effort is normal. No respiratory distress.     Breath sounds: Normal breath sounds. No wheezing.  Musculoskeletal:        General: Normal range of motion.     Cervical back: Neck supple.  Lymphadenopathy:     Cervical: No cervical adenopathy.  Skin:    General: Skin is warm and dry.     Findings: No rash.  Neurological:     Mental Status: He is alert and oriented to person, place, and time.     Coordination: Coordination normal.  Psychiatric:        Behavior: Behavior normal.       Assessment & Plan:   Problem List Items Addressed This Visit       Cardiovascular and Mediastinum   Essential hypertension   Relevant Orders   CBC with Differential/Platelet   CMP14+EGFR   Lipid panel     Respiratory   OSA (obstructive sleep apnea)   Relevant Medications    tirzepatide  (ZEPBOUND ) 15 MG/0.5ML Pen     Digestive   GERD (gastroesophageal reflux disease)     Genitourinary   BPH (benign prostatic hyperplasia)   Relevant Orders   PSA, total and free     Other   Dyslipidemia (high LDL; low HDL) - Primary   Relevant Orders  CBC with Differential/Platelet   CMP14+EGFR   Lipid panel   Obesity (BMI 30-39.9)   Relevant Medications   tirzepatide  (ZEPBOUND ) 15 MG/0.5ML Pen    Patient's BMI is >30 mg/m2.  Patient's current BMI is Body mass index is 27.98 kg/m.Jonathan Burke  Patient has lost and maintained a 5% body weight loss.  Patient is currently enrolled in a healthy eating plan along with encouraged exercise.   Patient does not have a personal or family history of medullary thyroid  carcinoma (MTC) or Multiple Endocrine Neoplasia syndrome type 2 (MEN 2).   Blood pressure and everything looks good today.  Will check some blood work today.  He feels a lot healthier and a lot happier since he has been able to lose and maintain his weight loss.  He is aiming to lose a bit more so increased the Zepbound  to 15 mg. Follow up plan: Return in about 6 months (around 04/14/2024), or if symptoms worsen or fail to improve, for Hypertension and hyperlipidemia and GERD.  Counseling provided for all of the vaccine components No orders of the defined types were placed in this encounter.   Jolyne Needs, MD [facility] 10/13/2023, 9:49 AM

## 2023-10-14 LAB — LIPID PANEL
Chol/HDL Ratio: 2.7 ratio (ref 0.0–5.0)
Cholesterol, Total: 114 mg/dL (ref 100–199)
HDL: 43 mg/dL (ref >39)
LDL Chol Calc (NIH): 59 mg/dL (ref 0–99)
Triglycerides: 53 mg/dL (ref 0–149)
VLDL Cholesterol Cal: 12 mg/dL (ref 5–40)

## 2023-10-14 LAB — CBC WITH DIFFERENTIAL/PLATELET
Basophils Absolute: 0 10*3/uL (ref 0.0–0.2)
Basos: 0 %
EOS (ABSOLUTE): 0.2 10*3/uL (ref 0.0–0.4)
Eos: 3 %
Hematocrit: 44.2 % (ref 37.5–51.0)
Hemoglobin: 14.6 g/dL (ref 13.0–17.7)
Immature Grans (Abs): 0 10*3/uL (ref 0.0–0.1)
Immature Granulocytes: 0 %
Lymphocytes Absolute: 1 10*3/uL (ref 0.7–3.1)
Lymphs: 18 %
MCH: 31.7 pg (ref 26.6–33.0)
MCHC: 33 g/dL (ref 31.5–35.7)
MCV: 96 fL (ref 79–97)
Monocytes Absolute: 0.5 10*3/uL (ref 0.1–0.9)
Monocytes: 9 %
Neutrophils Absolute: 3.9 10*3/uL (ref 1.4–7.0)
Neutrophils: 70 %
Platelets: 230 10*3/uL (ref 150–450)
RBC: 4.6 x10E6/uL (ref 4.14–5.80)
RDW: 12.4 % (ref 11.6–15.4)
WBC: 5.6 10*3/uL (ref 3.4–10.8)

## 2023-10-14 LAB — CMP14+EGFR
ALT: 20 IU/L (ref 0–44)
AST: 19 IU/L (ref 0–40)
Albumin: 4.2 g/dL (ref 3.9–4.9)
Alkaline Phosphatase: 53 IU/L (ref 44–121)
BUN/Creatinine Ratio: 17 (ref 10–24)
BUN: 14 mg/dL (ref 8–27)
Bilirubin Total: 0.5 mg/dL (ref 0.0–1.2)
CO2: 23 mmol/L (ref 20–29)
Calcium: 9.2 mg/dL (ref 8.6–10.2)
Chloride: 104 mmol/L (ref 96–106)
Creatinine, Ser: 0.84 mg/dL (ref 0.76–1.27)
Globulin, Total: 2.7 g/dL (ref 1.5–4.5)
Glucose: 81 mg/dL (ref 70–99)
Potassium: 4.7 mmol/L (ref 3.5–5.2)
Sodium: 140 mmol/L (ref 134–144)
Total Protein: 6.9 g/dL (ref 6.0–8.5)
eGFR: 99 mL/min/{1.73_m2} (ref >59)

## 2023-10-14 LAB — PSA, TOTAL AND FREE
PSA, Free Pct: 34 %
PSA, Free: 0.17 ng/mL
Prostate Specific Ag, Serum: 0.5 ng/mL (ref 0.0–4.0)

## 2023-10-21 DIAGNOSIS — M5412 Radiculopathy, cervical region: Secondary | ICD-10-CM | POA: Diagnosis not present

## 2023-10-21 DIAGNOSIS — Z6827 Body mass index (BMI) 27.0-27.9, adult: Secondary | ICD-10-CM | POA: Diagnosis not present

## 2023-10-26 ENCOUNTER — Ambulatory Visit: Payer: Self-pay | Admitting: Family Medicine

## 2023-10-26 NOTE — Progress Notes (Signed)
 [address]               [place], [place] [address]                      [phone]   PCP is Dettinger, Lucio Sabin, MD Referring Provider is Dettinger, Lucio Sabin, MD  Chief Complaint: Ascending thoracic aortic aneurysm   HPI: This is a 63 year old male with a past medical history of sleep apnea, GERD, hiatal hernia, and SBO who had a CT and echocardiogram in December of 2019 and was incidentally found to have a functionally bicuspid aortic valve without aortic stenosis or insufficiency and a 4.5 to 4.7 cm fusiform ascending aortic aneurysm. He was last seen by Dr. Sherene Dilling in May 2024 and the ATAA measured 4.5 cm. He presents today for further surveillance of his ATAA. He denies chest pain, pressure, or tightness. He has upcoming surgery scheduled with Dr. Ellery Guthrie July 1st for cervical and thoracic problems. He is no longer on Metoprolol  Tartrate as has intentionally lost 70 pounds (with Zepbound ) and no longer needs this.   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   Chest pain    Complication of anesthesia    states is hard to wake up   Dental crowns present    also caps   Dilated aortic root (HCC)    Enlarged prostate    GERD (gastroesophageal reflux disease)    daily OTC   H/O hiatal hernia    pt unsure if hernia or not, but has a knot in his stomach   Headache(784.0)    tension   PONV (postoperative nausea and vomiting)    Sleep apnea    pt non compliant with CPAP usage   Small bowel obstruction (HCC) 07/17/2012    Past Surgical History:  Procedure Laterality Date   APPENDECTOMY     CARPAL TUNNEL RELEASE  09/09/2011   Procedure: CARPAL TUNNEL RELEASE;  Surgeon: Amelie Baize., MD;  Location: [facility];  Service: Orthopedics;  Laterality: Left;   CHOLECYSTECTOMY     DIRECT LARYNGOSCOPY  07/07/2001   suspension microdirect laryngoscopy  with exc. left vocal cord mass   ESOPHAGOGASTRODUODENOSCOPY N/A 06/05/2013   Procedure: ESOPHAGOGASTRODUODENOSCOPY (EGD);  Surgeon: Venson Ginger., MD;  Location: [facility] END[facility]COPY;  Service: Endoscopy;  Laterality: N/A;   LAPAROSCOPIC LYSIS OF ADHESIONS N/A 05/29/2013   Procedure: DIAGNOSTIC LAPAROSCOPY CONVERTED TO EXPLORATORY LAPAROTOMY, LYSIS OF ADHESIONS ;  Surgeon: Rogena Class, MD;  Location: [facility] OR;  Service: General;  Laterality: N/A;   LUMBAR LAMINECTOMY/DECOMPRESSION MICRODISCECTOMY  10/29/1999   L5-S1   REPLACEMENT TOTAL KNEE Right 2020   SHOULDER SURGERY     left   TEE WITHOUT CARDIOVERSION N/A 11/05/2019   Procedure: TRANSESOPHAGEAL ECHOCARDIOGRAM (TEE);  Surgeon: Wenona Hamilton, MD;  Location: [facility] ORS;  Service: Cardiovascular;  Laterality: N/A;    Family History  Problem Relation Age of Onset   COPD Mother    Heart disease Mother    Rectal cancer Mother    Heart attack Father    Arthritis Sister    Hypertension Brother    Hypertension Sister    Anuerysm Sister    Stroke Sister    Arthritis Sister  RA    Social History Social History   Tobacco Use   Smoking status: Never   Smokeless tobacco: Never  Vaping Use   Vaping status: Never Used  Substance Use Topics   Alcohol use: No   Drug use: No    Current Outpatient Medications  Medication Sig Dispense Refill   Acetaminophen  500 MG capsule Take 500-1,000 mg by mouth every 6 (six) hours as needed for pain (or headaches).     ARTIFICIAL TEARS PF 0.1-0.3 % SOLN Place 1 drop into both eyes 3 (three) times daily as needed (for dryness).     aspirin EC 81 MG tablet Take 81 mg by mouth daily.     cetirizine  (ZYRTEC ) 10 MG tablet Take 1 tablet (10 mg total) by mouth daily. 90 tablet 1   Cholecalciferol (VITAMIN D3) 50 MCG (2000 UT) TABS Take 2,000 Units by mouth daily.     metoprolol  tartrate (LOPRESSOR ) 50 MG tablet Take one tablet two hours prior to the test 1 tablet 0   ondansetron  (ZOFRAN -ODT) 4  MG disintegrating tablet Take 1 tablet (4 mg total) by mouth every 8 (eight) hours as needed for nausea or vomiting. 10 tablet 0   pantoprazole  (PROTONIX ) 20 MG tablet TAKE ONE (1) TABLET BY MOUTH TWO (2) TIMES DAILY 60 tablet 6   pravastatin  (PRAVACHOL ) 20 MG tablet TAKE ONE (1) TABLET BY MOUTH EVERY DAY 90 tablet 3   sucralfate  (CARAFATE ) 1 g tablet Take 1 tablet (1 g total) by mouth 4 (four) times daily -  with meals and at bedtime. 120 tablet 1   tirzepatide  (ZEPBOUND ) 15 MG/0.5ML Pen Inject 15 mg into the skin once a week. 6 mL 3   vitamin B-12 (CYANOCOBALAMIN ) 500 MCG tablet Take 500-1,000 mcg by mouth daily.     vitamin C (ASCORBIC ACID) 500 MG tablet Take 500 mg by mouth daily.     zinc gluconate 50 MG tablet Take 50 mg by mouth daily.     Allergies  Allergen Reactions   Adhesive [Tape] Other (See Comments)    Regular tape pulls OFF the skin!! Only paper tape is tolerated.   Atorvastatin  Other (See Comments)    Made the patient's bones hurt   Vital Signs: Vitals:   11/09/23 0947  BP: 129/86  Pulse: 70  Resp: 20  SpO2: 96%     Physical Exam: CV-RRR, no murmur Neck-No carotid bruit Pulmonary-Clear to auscultation bilaterally Abdomen-Soft, non tender, bowel sounds present Extremities-No LE edema Neurologic-Grossly intact without focal deficit   Diagnostic Tests:  ADDENDUM REPORT: 08/29/2023 20:01   EXAM: OVER-READ INTERPRETATION  CT CHEST   The following report is an over-read performed by radiologist Dr. Cyndia Drape [facility] on 08/29/2023. This over-read does not include interpretation of cardiac or coronary anatomy or pathology. The coronary CTA interpretation by the cardiologist is attached.   COMPARISON:  None.   FINDINGS: Cardiovascular: See findings discussed in the body of the report. Ascending aortic aneurysm outer diameter up to 4.5 cm.   Mediastinum/Nodes: No suspicious adenopathy identified. Imaged mediastinal structures are  unremarkable.   Lungs/Pleura: There is dependent basilar subsegmental atelectasis. No pneumonia or pulmonary edema. No pleural effusion or pneumothorax.   Upper Abdomen: No acute abnormality.   Musculoskeletal: No chest wall abnormality. No acute osseous findings.   IMPRESSION: Ascending aortic aneurysm outer diameter 4.5 cm. Recommend follow-up every 6 months and vascular consultation.     Electronically Signed   By: Sydell Eva M.D.   On:  08/29/2023 20:01    Addended by Osborn Blaze, MD on 08/29/2023  8:04 PM    Study Result  Narrative & Impression  CLINICAL DATA:  51M with CAD, bicuspid aortic valve and ascending aorta aneurysm.   EXAM: Cardiac/Coronary  CT   TECHNIQUE: The patient was scanned on a [employer].   FINDINGS: A 120 kV prospective scan was triggered in the descending thoracic aorta at 111 HU's. Axial non-contrast 3 mm slices were carried out through the heart. The data set was analyzed on a dedicated work station and scored using the Agatson method. Gantry rotation speed was 250 msecs and collimation was .6 mm. No beta blockade and 0.8 mg of sl NTG was given. The 3D data set was reconstructed in 5% intervals of the 67-82 % of the R-R cycle. Diastolic phases were analyzed on a dedicated work station using MPR, MIP and VRT modes. The patient received 80 cc of contrast.   Aorta: Ascending aorta mildly dilated. 4.4 cm. Aortic atherosclerosis. No dissection.   Aortic Valve: Bicuspid. Fusion of the left and right coronary cusps. Aortic valve calcification. Aortic valve calcification 714.   Coronary Arteries:  Normal coronary origin.  Right dominance.   RCA is a large dominant artery that gives rise to PDA and PLVB. There is minimal (<25%) soft plaque in the mid RCA.   Left main is a large artery that gives rise to LAD, RI, and LCX arteries.   LAD is a large vessel that has minimal (<25%) mixed plaque proximally. D1 has minimal  (<25%) calcified plaque at the ostium.   LCX is a non-dominant artery with minimal (<25%) mixed plaque. OM 1 has minimal plaque at the ostium. There is no plaque in OM2 and OM3.   Coronary Calcium  Score:   Left main: 0   Left anterior descending artery: 121   Left circumflex artery: 0.763   Right coronary artery: 0   Total: 122   Percentile: 66th   Other findings:   Normal pulmonary vein drainage into the left atrium.   Normal let atrial appendage without a thrombus.   Normal size of the pulmonary artery.   Non-cardiac: See separate report from [facility].   IMPRESSION: 1. Coronary calcium  score of 121. This was 66th percentile for age-, sex, and race-matched controls.   2. Total plaque volume 215 mm3 which is 33rd percentile for age- and sex-matched controls (calcified plaque 34; non-calcified plaque 181). TPV is moderate.   3. Normal coronary origin with right dominance.   4. There is minimal (<25%) plaque in the RCA, LAD and LCX. CAD-RADS 1.   5. Bicuspid aortic valve. Aortic valve calcification. Aortic valve calcification score 714.   6.  Aortic atherosclerosis.   7.  Ascending aorta mildly dilated.  4.4 cm.   RECOMMENDATIONS: 1. CAD-RADS 0: No evidence of CAD (0%). Consider non-atherosclerotic causes of chest pain.   2. CAD-RADS 1: Minimal non-obstructive CAD (0-24%). Consider non-atherosclerotic causes of chest pain. Consider preventive therapy and risk factor modification.   3. CAD-RADS 2: Mild non-obstructive CAD (25-49%). Consider non-atherosclerotic causes of chest pain. Consider preventive therapy and risk factor modification.   4. CAD-RADS 3: Moderate stenosis. Consider symptom-guided anti-ischemic pharmacotherapy as well as risk factor modification per guideline directed care. Additional analysis with CT FFR will be submitted.   5. CAD-RADS 4: Severe stenosis. (70-99% or > 50% left main). Cardiac catheterization or CT FFR is  recommended. Consider symptom-guided anti-ischemic pharmacotherapy as well as risk factor modification  per guideline directed care. Invasive coronary angiography recommended with revascularization per published guideline statements.   6. CAD-RADS 5: Total coronary occlusion (100%). Consider cardiac catheterization or viability assessment. Consider symptom-guided anti-ischemic pharmacotherapy as well as risk factor modification per guideline directed care.   7. CAD-RADS N: Non-diagnostic study. Obstructive CAD can't be excluded. Alternative evaluation is recommended.   Maudine Sos, MD   Electronically Signed: By: Maudine Sos M.D. On: 08/11/2023 16:20      Impression and Plan: His BP is elevated at this visit, but he was stuck in traffic and is a bit anxious. He takes his BP and he states SBP is usually 110 or less. CTA showed a 4.4 cm ascending aortic aneurysm.  Echocardiogram done March 2025 showed a bicuspid valve, trivial aortic valve regurgitation is trivial, and mild aortic valve stenosis. Cardiology will determine and order future echocardiograms. Patient does not require surgical intervention at this time. We discussed the natural history and and risk factors for growth of ascending aortic aneurysms.  We covered the importance of smoking cessation, tight blood pressure control, refraining from lifting heavy objects, and avoiding fluoroquinolones (at patient request, added to allergy list).  The patient is aware of signs and symptoms of aortic dissection and when to present to the emergency department.  We will continue surveillance and a repeat CTA was ordered for 1 year.   Allegra Arch, PA-C [facility] [phone]

## 2023-10-31 ENCOUNTER — Encounter: Payer: Self-pay | Admitting: Physical Therapy

## 2023-10-31 ENCOUNTER — Ambulatory Visit: Attending: Neurological Surgery | Admitting: Physical Therapy

## 2023-10-31 ENCOUNTER — Other Ambulatory Visit: Payer: Self-pay

## 2023-10-31 DIAGNOSIS — M542 Cervicalgia: Secondary | ICD-10-CM | POA: Insufficient documentation

## 2023-10-31 DIAGNOSIS — M6281 Muscle weakness (generalized): Secondary | ICD-10-CM | POA: Insufficient documentation

## 2023-10-31 NOTE — Therapy (Signed)
 OUTPATIENT PHYSICAL THERAPY CERVICAL EVALUATION   Patient Name: Jonathan Burke MRN: [id number] DOB:07-03-1960, 63 y.o., male Today's Date: 10/31/2023  END OF SESSION:  PT End of Session - 10/31/23 1039     Visit Number 1    Number of Visits 6    Date for PT Re-Evaluation 12/12/23    PT Start Time 0857    PT Stop Time 0928    PT Time Calculation (min) 31 min    Activity Tolerance Patient tolerated treatment well    Behavior During Therapy [facility] for tasks assessed/performed             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   Chest pain    Complication of anesthesia    states is hard to wake up   Dental crowns present    also caps   Dilated aortic root (HCC)    Enlarged prostate    GERD (gastroesophageal reflux disease)    daily OTC   H/O hiatal hernia    pt unsure if hernia or not, but has a knot in his stomach   Headache(784.0)    tension   PONV (postoperative nausea and vomiting)    Sleep apnea    pt non compliant with CPAP usage   Small bowel obstruction (HCC) 07/17/2012   Past Surgical History:  Procedure Laterality Date   APPENDECTOMY     CARPAL TUNNEL RELEASE  09/09/2011   Procedure: CARPAL TUNNEL RELEASE;  Surgeon: Amelie Baize., MD;  Location: [facility];  Service: Orthopedics;  Laterality: Left;   CHOLECYSTECTOMY     DIRECT LARYNGOSCOPY  07/07/2001   suspension microdirect laryngoscopy with exc. left vocal cord mass   ESOPHAGOGASTRODUODENOSCOPY N/A 06/05/2013   Procedure: ESOPHAGOGASTRODUODENOSCOPY (EGD);  Surgeon: Venson Ginger., MD;  Location: [facility] ENDOSCOPY;  Service: Endoscopy;  Laterality: N/A;   LAPAROSCOPIC LYSIS OF ADHESIONS N/A 05/29/2013   Procedure: DIAGNOSTIC LAPAROSCOPY CONVERTED TO EXPLORATORY LAPAROTOMY, LYSIS OF ADHESIONS ;  Surgeon: Rogena Class, MD;  Location: [facility] OR;  Service: General;  Laterality: N/A;   LUMBAR  LAMINECTOMY/DECOMPRESSION MICRODISCECTOMY  10/29/1999   L5-S1   REPLACEMENT TOTAL KNEE Right 2020   SHOULDER SURGERY     left   TEE WITHOUT CARDIOVERSION N/A 11/05/2019   Procedure: TRANSESOPHAGEAL ECHOCARDIOGRAM (TEE);  Surgeon: Wenona Hamilton, MD;  Location: [facility] ORS;  Service: Cardiovascular;  Laterality: N/A;   Patient Active Problem List   Diagnosis Date Noted   Nasal congestion with rhinorrhea 12/14/2022   Sore throat 12/14/2022   Acute cough 12/14/2022   Morbid obesity (HCC) 06/24/2022   Bicuspid aortic valve    Thoracic aortic aneurysm without rupture (HCC) 09/25/2018   Essential hypertension 07/31/2018   Coronary artery disease involving native coronary artery of native heart without angina pectoris 07/31/2018   Aneurysm, ascending aorta 07/31/2018   Pulsatile tinnitus 07/14/2018   OSA (obstructive sleep apnea) 04/13/2018   Dyslipidemia (high LDL; low HDL) 09/05/2012   BPH (benign prostatic hyperplasia) 09/05/2012   GERD (gastroesophageal reflux disease) 07/17/2012   Obesity (BMI 30-39.9) 07/17/2012   REFERRING PROVIDER: Elna Haggis MD  REFERRING DIAG: Left cervical radiculopathy  THERAPY DIAG:  Cervicalgia  Muscle weakness (generalized)  Rationale for Evaluation and Treatment: Rehabilitation  ONSET DATE: About a year.  SUBJECTIVE:  SUBJECTIVE STATEMENT: The patient presents to the clinic with c/o left-sided neck pain that radiates down his arm.  He states by the end of the day he is in severe pain.  He states that per his Neuro consult he needs surgery.  He has to tilt his head to the right to get some relief and/or put his left UE over his head.   PERTINENT HISTORY:  See above.    PAIN:  Are you having pain? Yes: NPRS scale: Low at rest this morning with left  arm by side.  By end of day "severe." Pain location: Left cervical/left UE. Pain description: Ache, throbbing, shooting.   Aggravating factors: Use of left UE and generally increased activity. Relieving factors: Resting/being still with left arm by side.  See subjective section.    PRECAUTIONS: None  RED FLAGS: None     FALLS:  Has patient fallen in last 6 months? No  LIVING ENVIRONMENT: Lives with: lives with their spouse Lives in: House/apartment Has following equipment at home: None  PATIENT GOALS: Get surgery and be out of pain.      OBJECTIVE:   PATIENT SURVEYS:  NDI 17/50   POSTURE: rounded shoulders and forward head.  Patient is aware of correct posture and use a pillow behind his head and neck in the evening to promote better posture.  PALPATION: Tender to palpation left C5-C7.   CERVICAL ROM:   Active right cervical rotation is 70 degrees and left is 35 degrees (painful).  UPPER EXTREMITY ROM:  Left UE WNL's.  UPPER EXTREMITY MMT:  The patient is right hand dominant and gave an output of 60# on the right and left is 20#.  Left IR/ER strength decreased to 4/5.     DTR's:  Diminished left Biceps reflex per contralateral comparison and unable to elicit brachioradialis and triceps reflexes.     TREATMENT DATE:                                                                                                                                  PATIENT EDUCATION:  Education details: Discussed correct posture which he is aware of. Person educated: Patient Education method: Explanation Education comprehension: verbalized understanding  HOME EXERCISE PROGRAM:   ASSESSMENT:  CLINICAL IMPRESSION: The patient presents to the clinic with c/o chronic left-sided neck pain and radiation into left UE.  By the end of the day he is in severe pain.  His is very limited into left active cervical rotation which is painful. He exhibits left UE weakness ands his left  DTR's are diminished and absent.  He is tender to palpation left C5-C&.  His NDI is 17/50.    OBJECTIVE IMPAIRMENTS: decreased activity tolerance, decreased ROM, increased muscle spasms, and pain.   ACTIVITY LIMITATIONS: carrying, lifting, and reach over head  PARTICIPATION LIMITATIONS: meal prep, cleaning, laundry, community activity, occupation, and yard work  PERSONAL FACTORS: Time since onset of injury/illness/exacerbation  are also affecting patient's functional outcome.   REHAB POTENTIAL: Poor /Fair-  CLINICAL DECISION MAKING: Evolving/moderate complexity  EVALUATION COMPLEXITY: Moderate   GOALS:  SHORT TERM GOALS: Target date: 12/12/23  Ind with a HEP.  Goal status: INITIAL  2.  Improve active left cervical rotation to 50 degrees.  Goal status: INITIAL  3.  End of day pain not > 5-6/10.  Goal status: INITIAL  PLAN:  Freq and duration:  6 visits.  PLANNED INTERVENTIONS: 97110-Therapeutic exercises, 97530- Therapeutic activity, [id number]- Neuromuscular re-education, 97535- Self Care, [id number]- Manual therapy, [id number]- Electrical stimulation (unattended), 97035- Ultrasound, Cryotherapy, and Moist heat  PLAN FOR NEXT SESSION: Chin tucks and extension, manual traction.  Modalities as needed.   Rubylee Zamarripa, [place], PT 10/31/2023, 12:12 PM

## 2023-11-02 DIAGNOSIS — R3912 Poor urinary stream: Secondary | ICD-10-CM | POA: Diagnosis not present

## 2023-11-02 DIAGNOSIS — R351 Nocturia: Secondary | ICD-10-CM | POA: Diagnosis not present

## 2023-11-02 DIAGNOSIS — N401 Enlarged prostate with lower urinary tract symptoms: Secondary | ICD-10-CM | POA: Diagnosis not present

## 2023-11-02 DIAGNOSIS — R3914 Feeling of incomplete bladder emptying: Secondary | ICD-10-CM | POA: Diagnosis not present

## 2023-11-07 ENCOUNTER — Encounter: Admitting: Physical Therapy

## 2023-11-09 ENCOUNTER — Ambulatory Visit: Attending: Surgery | Admitting: Physician Assistant

## 2023-11-09 ENCOUNTER — Encounter: Payer: Self-pay | Admitting: Physician Assistant

## 2023-11-09 VITALS — BP 129/86 | HR 70 | Resp 20 | Ht 68.0 in | Wt 185.0 lb

## 2023-11-09 DIAGNOSIS — I7121 Aneurysm of the ascending aorta, without rupture: Secondary | ICD-10-CM | POA: Diagnosis not present

## 2023-11-09 NOTE — Patient Instructions (Addendum)
 Risk Modification in those with ascending thoracic aortic aneurysm:  Continue good control of blood pressure (prefer SBP 130/80 or less)-with 70 pounds weight  Loss (Zepbound ), is no longer on BP medication  2. Avoid fluoroquinolone antibiotics (I.e Ciprofloxacin, Avelox, Levofloxacin, Ofloxacin)  3.  Use of statin (to decrease cardiovascular risk)-continue Pravastatin  (Pravachol )  4.  Exercise and activity limitations is individualized, but in general, contact sports are to be  avoided and one should avoid heavy lifting (defined as half of ideal body weight) and exercises involving sustained Valsalva maneuver.  5. Counseling for those suspected of having genetically mediated disease. First-degree relatives of those with TAA disease should be screened as well as those who have a connective tissue disease (I.e with Marfan syndrome, Ehlers-Danlos syndrome,  and Loeys-Dietz syndrome) or a  bicuspid aortic valve,have an increased risk for  complications related to TAA. He denies family history of TAA and personal history of connective tissue disorder.  Echocardiogram done March 2025 showed LVEF 55-60%, aortic valve is bicuspid, trivial aortic valve regurgitation is trivial, mild aortic valve stenosis, borderline dilatation of the aortic root, measuring 38 mm, and moderate dilatation of the ascending  aorta, measuring 45 mm.   6. He has no history of tobacco abuse.

## 2023-11-29 DIAGNOSIS — I350 Nonrheumatic aortic (valve) stenosis: Secondary | ICD-10-CM | POA: Diagnosis not present

## 2023-11-29 DIAGNOSIS — M50123 Cervical disc disorder at C6-C7 level with radiculopathy: Secondary | ICD-10-CM | POA: Diagnosis not present

## 2023-11-29 DIAGNOSIS — M5013 Cervical disc disorder with radiculopathy, cervicothoracic region: Secondary | ICD-10-CM | POA: Diagnosis not present

## 2023-11-29 DIAGNOSIS — M5412 Radiculopathy, cervical region: Secondary | ICD-10-CM | POA: Diagnosis not present

## 2023-12-12 ENCOUNTER — Emergency Department (HOSPITAL_BASED_OUTPATIENT_CLINIC_OR_DEPARTMENT_OTHER)

## 2023-12-12 ENCOUNTER — Inpatient Hospital Stay (HOSPITAL_BASED_OUTPATIENT_CLINIC_OR_DEPARTMENT_OTHER)
Admission: EM | Admit: 2023-12-12 | Discharge: 2023-12-14 | DRG: 390 | Disposition: A | Discharge status: Home / Self Care | Attending: Internal Medicine | Admitting: Internal Medicine

## 2023-12-12 ENCOUNTER — Encounter (HOSPITAL_BASED_OUTPATIENT_CLINIC_OR_DEPARTMENT_OTHER): Payer: Self-pay

## 2023-12-12 ENCOUNTER — Other Ambulatory Visit: Payer: Self-pay

## 2023-12-12 DIAGNOSIS — D72828 Other elevated white blood cell count: Secondary | ICD-10-CM | POA: Diagnosis not present

## 2023-12-12 DIAGNOSIS — Z825 Family history of asthma and other chronic lower respiratory diseases: Secondary | ICD-10-CM | POA: Diagnosis not present

## 2023-12-12 DIAGNOSIS — M5412 Radiculopathy, cervical region: Secondary | ICD-10-CM | POA: Diagnosis present

## 2023-12-12 DIAGNOSIS — Z9049 Acquired absence of other specified parts of digestive tract: Secondary | ICD-10-CM

## 2023-12-12 DIAGNOSIS — Z4682 Encounter for fitting and adjustment of non-vascular catheter: Secondary | ICD-10-CM | POA: Diagnosis not present

## 2023-12-12 DIAGNOSIS — Z79899 Other long term (current) drug therapy: Secondary | ICD-10-CM

## 2023-12-12 DIAGNOSIS — K219 Gastro-esophageal reflux disease without esophagitis: Secondary | ICD-10-CM | POA: Diagnosis present

## 2023-12-12 DIAGNOSIS — Z800 Family history of malignant neoplasm of digestive organs: Secondary | ICD-10-CM | POA: Diagnosis not present

## 2023-12-12 DIAGNOSIS — Z823 Family history of stroke: Secondary | ICD-10-CM

## 2023-12-12 DIAGNOSIS — I2510 Atherosclerotic heart disease of native coronary artery without angina pectoris: Secondary | ICD-10-CM | POA: Diagnosis not present

## 2023-12-12 DIAGNOSIS — Z91048 Other nonmedicinal substance allergy status: Secondary | ICD-10-CM

## 2023-12-12 DIAGNOSIS — Z8261 Family history of arthritis: Secondary | ICD-10-CM

## 2023-12-12 DIAGNOSIS — K56601 Complete intestinal obstruction, unspecified as to cause: Principal | ICD-10-CM | POA: Diagnosis present

## 2023-12-12 DIAGNOSIS — K4090 Unilateral inguinal hernia, without obstruction or gangrene, not specified as recurrent: Secondary | ICD-10-CM | POA: Diagnosis not present

## 2023-12-12 DIAGNOSIS — Z7982 Long term (current) use of aspirin: Secondary | ICD-10-CM | POA: Diagnosis not present

## 2023-12-12 DIAGNOSIS — Z7985 Long-term (current) use of injectable non-insulin antidiabetic drugs: Secondary | ICD-10-CM | POA: Diagnosis not present

## 2023-12-12 DIAGNOSIS — R112 Nausea with vomiting, unspecified: Secondary | ICD-10-CM | POA: Diagnosis not present

## 2023-12-12 DIAGNOSIS — Z8249 Family history of ischemic heart disease and other diseases of the circulatory system: Secondary | ICD-10-CM | POA: Diagnosis not present

## 2023-12-12 DIAGNOSIS — K5669 Other partial intestinal obstruction: Secondary | ICD-10-CM | POA: Diagnosis not present

## 2023-12-12 DIAGNOSIS — I7121 Aneurysm of the ascending aorta, without rupture: Secondary | ICD-10-CM | POA: Diagnosis present

## 2023-12-12 DIAGNOSIS — Z96651 Presence of right artificial knee joint: Secondary | ICD-10-CM | POA: Diagnosis present

## 2023-12-12 DIAGNOSIS — Z888 Allergy status to other drugs, medicaments and biological substances status: Secondary | ICD-10-CM

## 2023-12-12 DIAGNOSIS — K439 Ventral hernia without obstruction or gangrene: Secondary | ICD-10-CM | POA: Diagnosis not present

## 2023-12-12 DIAGNOSIS — N400 Enlarged prostate without lower urinary tract symptoms: Secondary | ICD-10-CM | POA: Diagnosis not present

## 2023-12-12 DIAGNOSIS — G4733 Obstructive sleep apnea (adult) (pediatric): Secondary | ICD-10-CM | POA: Diagnosis present

## 2023-12-12 DIAGNOSIS — K56609 Unspecified intestinal obstruction, unspecified as to partial versus complete obstruction: Principal | ICD-10-CM | POA: Diagnosis present

## 2023-12-12 DIAGNOSIS — K565 Intestinal adhesions [bands], unspecified as to partial versus complete obstruction: Secondary | ICD-10-CM | POA: Diagnosis not present

## 2023-12-12 LAB — COMPREHENSIVE METABOLIC PANEL WITH GFR
ALT: 18 U/L (ref 0–44)
AST: 22 U/L (ref 15–41)
Albumin: 4.7 g/dL (ref 3.5–5.0)
Alkaline Phosphatase: 76 U/L (ref 38–126)
Anion gap: 15 (ref 5–15)
BUN: 12 mg/dL (ref 8–23)
CO2: 23 mmol/L (ref 22–32)
Calcium: 10.3 mg/dL (ref 8.9–10.3)
Chloride: 101 mmol/L (ref 98–111)
Creatinine, Ser: 0.92 mg/dL (ref 0.61–1.24)
GFR, Estimated: >60 mL/min (ref >60)
Glucose, Bld: 120 mg/dL — ABNORMAL HIGH (ref 70–99)
Potassium: 3.8 mmol/L (ref 3.5–5.1)
Sodium: 139 mmol/L (ref 135–145)
Total Bilirubin: 0.7 mg/dL (ref 0.0–1.2)
Total Protein: 8.7 g/dL — ABNORMAL HIGH (ref 6.5–8.1)

## 2023-12-12 LAB — LIPASE, BLOOD: Lipase: 30 U/L (ref 11–51)

## 2023-12-12 LAB — CBC
HCT: 42.7 % (ref 39.0–52.0)
Hemoglobin: 15.1 g/dL (ref 13.0–17.0)
MCH: 32.3 pg (ref 26.0–34.0)
MCHC: 35.4 g/dL (ref 30.0–36.0)
MCV: 91.4 fL (ref 80.0–100.0)
Platelets: 329 K/uL (ref 150–400)
RBC: 4.67 MIL/uL (ref 4.22–5.81)
RDW: 12.3 % (ref 11.5–15.5)
WBC: 13.3 K/uL — ABNORMAL HIGH (ref 4.0–10.5)
nRBC: 0 % (ref 0.0–0.2)

## 2023-12-12 LAB — LACTIC ACID, PLASMA: Lactic Acid, Venous: 2 mmol/L (ref 0.5–1.9)

## 2023-12-12 MED ORDER — HYDROMORPHONE HCL 1 MG/ML IJ SOLN
0.5000 mg | Freq: Once | INTRAMUSCULAR | Status: AC
  Administered 2023-12-12: 0.5 mg via INTRAVENOUS
  Filled 2023-12-12: qty 1

## 2023-12-12 MED ORDER — IOHEXOL 300 MG/ML  SOLN
100.0000 mL | Freq: Once | INTRAMUSCULAR | Status: AC | PRN
  Administered 2023-12-12: 100 mL via INTRAVENOUS

## 2023-12-12 MED ORDER — MORPHINE SULFATE (PF) 2 MG/ML IV SOLN
2.0000 mg | INTRAVENOUS | Status: DC | PRN
  Administered 2023-12-12: 2 mg via INTRAVENOUS
  Filled 2023-12-12: qty 1

## 2023-12-12 MED ORDER — LACTATED RINGERS IV BOLUS
1000.0000 mL | Freq: Once | INTRAVENOUS | Status: AC
  Administered 2023-12-12: 1000 mL via INTRAVENOUS

## 2023-12-12 MED ORDER — ONDANSETRON HCL 4 MG/2ML IJ SOLN
4.0000 mg | Freq: Once | INTRAMUSCULAR | Status: AC | PRN
  Administered 2023-12-12: 4 mg via INTRAVENOUS
  Filled 2023-12-12: qty 2

## 2023-12-12 NOTE — ED Notes (Signed)
 Patient placed on 2L Lupus due to desaturations after pain med administration. RN aware.

## 2023-12-12 NOTE — Progress Notes (Signed)
 Hospitalist Transfer Note:    Nursing staff, Please call [facility] Admits & Consults System-Wide number on Amion [phone]) as soon as patient's arrival, so appropriate admitting provider can evaluate the pt.   Transferring facility: [facility] Requesting provider: Dr. Caron Salt (EDP at [facility]) Reason for transfer: admission for further evaluation and management of SBO.    63 year old male with history prior small bowel obstructions, who presented to [facility] ED complaining of new onset abdominal pain starting this morning associated with nausea/vomiting as well as the absence of flatus production.  The patient reports that his most recent bowel movement occurred earlier this morning, before subsequently developing the abdominal pain associated with nausea/vomiting and absence of flatus production.  The patient confirms a history of prior small bowel obstructions, and conveys that the above presentation is similar to the constellation of symptoms with which he has presented at times of prior small bowel striction's.  Vital signs in the ED were notable for the following: Afebrile.  Labs were notable for CBC, which showed white blood cell count of 13,300.  Lactic acid 2.0, with repeat lactic acid level currently pending.  Imaging notable for CT abdomen/pelvis with contrast showed evidence of small bowel obstruction with transition point in the central abdomen, in the absence of evidence of perforation or abscess.   EDP d/w on-call General Surgery, Dr. Tanda, who recommended [facility] admission to either [facility] or [facility] (no preference), and conveyed that general surgery will formally consult. Additionally, Dr. Tanda requested placement of NGT, which is currently underway at [facility].  Medications administered prior to transfer included the following: IV Zofran , IV Dilaudid .  Subsequently, I accepted this patient for transfer for inpatient admission to a med/tele bed at [facility] or [facility]  (first available) for further work-up and  management of the above.      Eva Pore, DO Hospitalist

## 2023-12-12 NOTE — ED Triage Notes (Signed)
 Pt presents via POV c/o N/V, abd abd pain x3 hrs. Reports previous hx of SBO.

## 2023-12-12 NOTE — ED Provider Notes (Signed)
 [place] EMERGENCY DEPARTMENT AT [facility] Provider Note   CSN: [id number] Arrival date & time: 12/12/23  2032     Patient presents with: Abdominal Pain   Jonathan Burke is a 63 y.o. male.   Is a 63 year old male presenting emergency department with abdominal pain.  Reports history of SBO's with pain similar, but worse than typical.  Numerous episodes of nausea vomiting.  Not passing flatus.  Symptoms started at 9 AM this morning and is worsened throughout the course of the day.   Abdominal Pain      Prior to Admission medications   Medication Sig Start Date End Date Taking? Authorizing Provider  Acetaminophen  500 MG capsule Take 500-1,000 mg by mouth every 6 (six) hours as needed for pain (or headaches).    [provider]  ARTIFICIAL TEARS PF 0.1-0.3 % SOLN Place 1 drop into both eyes 3 (three) times daily as needed (for dryness). Patient not taking: Reported on 11/09/2023    [provider]  aspirin EC 81 MG tablet Take 81 mg by mouth daily.    [provider]  cetirizine  (ZYRTEC ) 10 MG tablet Take 1 tablet (10 mg total) by mouth daily. 12/14/22   [facility] Sebastian Pool, NP  Cholecalciferol (VITAMIN D3) 50 MCG (2000 UT) TABS Take 2,000 Units by mouth daily.    [provider]  metoprolol  tartrate (LOPRESSOR ) 50 MG tablet Take one tablet two hours prior to the test Patient not taking: Reported on 11/09/2023 07/26/23   Arida, Muhammad A, MD  ondansetron  (ZOFRAN -ODT) 4 MG disintegrating tablet Take 1 tablet (4 mg total) by mouth every 8 (eight) hours as needed for nausea or vomiting. Patient not taking: Reported on 11/09/2023 08/16/22   Harris, Abigail, PA-C  pantoprazole  (PROTONIX ) 20 MG tablet TAKE ONE (1) TABLET BY MOUTH TWO (2) TIMES DAILY 07/27/23   Darron Deatrice LABOR, MD  pravastatin  (PRAVACHOL ) 20 MG tablet TAKE ONE (1) TABLET BY MOUTH EVERY DAY 09/09/23   Darron Deatrice LABOR, MD  sucralfate  (CARAFATE ) 1 g tablet Take 1 tablet (1 g  total) by mouth 4 (four) times daily -  with meals and at bedtime. Patient not taking: Reported on 11/09/2023 08/16/22   Harris, Abigail, PA-C  tirzepatide  (ZEPBOUND ) 15 MG/0.5ML Pen Inject 15 mg into the skin once a week. 10/13/23   Dettinger, Fonda LABOR, MD  vitamin B-12 (CYANOCOBALAMIN ) 500 MCG tablet Take 500-1,000 mcg by mouth daily.    [provider]  vitamin C (ASCORBIC ACID) 500 MG tablet Take 500 mg by mouth daily.    [provider]  zinc gluconate 50 MG tablet Take 50 mg by mouth daily.    [provider]    Allergies: Adhesive [tape], Atorvastatin , and Quinolones    Review of Systems  Gastrointestinal:  Positive for abdominal pain.    Updated Vital Signs BP (!) 137/102   Pulse 96   Temp 98.3 F (36.8 C)   Resp (!) 21   SpO2 100%   Physical Exam Vitals and nursing note reviewed.  Constitutional:      General: Jonathan Burke is not in acute distress.    Appearance: Jonathan Burke is not toxic-appearing.  HENT:     Head: Normocephalic and atraumatic.  Cardiovascular:     Rate and Rhythm: Normal rate and regular rhythm.  Abdominal:     General: Abdomen is flat.     Palpations: Abdomen is soft.     Tenderness: There is generalized abdominal tenderness. There is no guarding  or rebound.  Genitourinary:    Prostate: Normal.     Rectum: Normal.  Skin:    General: Skin is warm and dry.     Capillary Refill: Capillary refill takes less than 2 seconds.  Neurological:     Mental Status: Jonathan Burke is alert and oriented to person, place, and time.  Psychiatric:        Mood and Affect: Mood normal.        Behavior: Behavior normal.     (all labs ordered are listed, but only abnormal results are displayed) Labs Reviewed  COMPREHENSIVE METABOLIC PANEL WITH GFR - Abnormal; Notable for the following components:      Result Value   Glucose, Bld 120 (*)    Total Protein 8.7 (*)    All other components within normal limits  CBC - Abnormal; Notable for the following components:    WBC 13.3 (*)    All other components within normal limits  LACTIC ACID, PLASMA - Abnormal; Notable for the following components:   Lactic Acid, Venous 2.0 (*)    All other components within normal limits  LIPASE, BLOOD  URINALYSIS, ROUTINE W REFLEX MICROSCOPIC  LACTIC ACID, PLASMA    EKG: None  Radiology: CT ABDOMEN PELVIS W CONTRAST Result Date: 12/12/2023 CLINICAL DATA:  Bowel obstruction suspected. Nausea, vomiting, abdominal pain. History of small-bowel obstructions EXAM: CT ABDOMEN AND PELVIS WITH CONTRAST TECHNIQUE: Multidetector CT imaging of the abdomen and pelvis was performed using the standard protocol following bolus administration of intravenous contrast. RADIATION DOSE REDUCTION: This exam was performed according to the departmental dose-optimization program which includes automated exposure control, adjustment of the mA and/or kV according to patient size and/or use of iterative reconstruction technique. CONTRAST:  OMNIPAQUE  IOHEXOL  300 MG/ML  SOLN COMPARISON:  CT 08/16/2022 FINDINGS: Lower chest: No acute abnormality. Hepatobiliary: Cholecystectomy. Liver and biliary tree are unremarkable. Pancreas: Unremarkable. Spleen: Unremarkable. Adrenals/Urinary Tract: Normal adrenal glands. No urinary calculi or hydronephrosis. Unremarkable bladder. Stomach/Bowel: Stomach is within normal limits. Marked dilation of the small bowel with air-fluid levels. Abrupt transition point in the anterior mid abdomen where there is associated swirling of the small bowel (series 2/image 46-47). No bowel wall thickening. Normal caliber colon. Vascular/Lymphatic: Aortic atherosclerosis. No enlarged abdominal or pelvic lymph nodes. Reproductive: Unremarkable. Other: No free intraperitoneal air. No abscess. Trace free fluid in the pelvis. 2 fat containing ventral abdominal wall hernias. Fat containing left inguinal hernia. Musculoskeletal: No acute fracture. IMPRESSION: 1. High-grade small bowel obstruction  with abrupt transition point in the anterior mid abdomen where there is associated swirling of the small bowel. Aortic Atherosclerosis (ICD10-I70.0). Electronically Signed   By: Norman Gatlin M.D.   On: 12/12/2023 22:04     Procedures   Medications Ordered in the ED  ondansetron  (ZOFRAN ) injection 4 mg (4 mg Intravenous Given 12/12/23 2045)  iohexol  (OMNIPAQUE ) 300 MG/ML solution 100 mL (100 mLs Intravenous Contrast Given 12/12/23 2146)  HYDROmorphone  (DILAUDID ) injection 0.5 mg (0.5 mg Intravenous Given 12/12/23 2218)  lactated ringers  bolus 1,000 mL (1,000 mLs Intravenous New Bag/Given 12/12/23 2230)    Clinical Course as of 12/12/23 2319  Mon Dec 12, 2023  2248 Spoke with general surgery, Dr. Tanda will see in consult.  Recommending repeat lactate, and placing NG tube. [TY]  2311 Spoke with Dr. Doc who will admit patient. [TY]    Clinical Course User Index [TY] Neysa Caron PARAS, DO  Medical Decision Making This is a 64 year old male presenting emergency department with abdominal pain.  Has a history of SBO, large prostate, aortic aneurysm, prior cholecystectomy appendectomy, laparoscopic lysis of adhesions presenting emergency department with abdominal pain.  Jonathan Burke is afebrile, was mildly tachycardic, hypertensive.  Soft abdomen with some mild diffuse tenderness.  Screening labs with minor leukocytosis, borderline lactate, otherwise reassuring kidney function.  No transaminitis suggest better bili disease.  Lipase normal.  Pancreatitis unlikely.  Patient was in severe pain given morphine , and then Dilaudid  which improvement of his symptoms.  Zofran  and IV fluids as well.  CT scan with SBO.  NG tube ordered.  Discussed case with surgery and hospitalist.  See ED course.  Admission for SBO at this point.  Amount and/or Complexity of Data Reviewed Independent Historian:     Details: Wife notes numerous SBO's and had prior surgery to adhesion. External Data  Reviewed:     Details: Appears last time Jonathan Burke was admitted for SBO was several years ago.  Labs: ordered. Radiology: ordered.  Risk Prescription drug management. Parenteral controlled substances. Decision regarding hospitalization. Diagnosis or treatment significantly limited by social determinants of health. Risk Details: Poor health literacy      Final diagnoses:  None    ED Discharge Orders     None          Neysa Caron PARAS, DO 12/12/23 2319

## 2023-12-13 ENCOUNTER — Inpatient Hospital Stay (HOSPITAL_COMMUNITY)

## 2023-12-13 ENCOUNTER — Encounter (HOSPITAL_COMMUNITY): Payer: Self-pay | Admitting: Family Medicine

## 2023-12-13 DIAGNOSIS — K56609 Unspecified intestinal obstruction, unspecified as to partial versus complete obstruction: Secondary | ICD-10-CM | POA: Diagnosis present

## 2023-12-13 DIAGNOSIS — Z7985 Long-term (current) use of injectable non-insulin antidiabetic drugs: Secondary | ICD-10-CM | POA: Diagnosis not present

## 2023-12-13 DIAGNOSIS — Z823 Family history of stroke: Secondary | ICD-10-CM | POA: Diagnosis not present

## 2023-12-13 DIAGNOSIS — G4733 Obstructive sleep apnea (adult) (pediatric): Secondary | ICD-10-CM | POA: Diagnosis present

## 2023-12-13 DIAGNOSIS — Z825 Family history of asthma and other chronic lower respiratory diseases: Secondary | ICD-10-CM | POA: Diagnosis not present

## 2023-12-13 DIAGNOSIS — Z9049 Acquired absence of other specified parts of digestive tract: Secondary | ICD-10-CM | POA: Diagnosis not present

## 2023-12-13 DIAGNOSIS — K219 Gastro-esophageal reflux disease without esophagitis: Secondary | ICD-10-CM | POA: Diagnosis present

## 2023-12-13 DIAGNOSIS — M5412 Radiculopathy, cervical region: Secondary | ICD-10-CM | POA: Diagnosis present

## 2023-12-13 DIAGNOSIS — Z8261 Family history of arthritis: Secondary | ICD-10-CM | POA: Diagnosis not present

## 2023-12-13 DIAGNOSIS — Z7982 Long term (current) use of aspirin: Secondary | ICD-10-CM | POA: Diagnosis not present

## 2023-12-13 DIAGNOSIS — K565 Intestinal adhesions [bands], unspecified as to partial versus complete obstruction: Secondary | ICD-10-CM | POA: Diagnosis present

## 2023-12-13 DIAGNOSIS — Z96651 Presence of right artificial knee joint: Secondary | ICD-10-CM | POA: Diagnosis present

## 2023-12-13 DIAGNOSIS — D72828 Other elevated white blood cell count: Secondary | ICD-10-CM | POA: Diagnosis present

## 2023-12-13 DIAGNOSIS — I7121 Aneurysm of the ascending aorta, without rupture: Secondary | ICD-10-CM | POA: Diagnosis present

## 2023-12-13 DIAGNOSIS — I2510 Atherosclerotic heart disease of native coronary artery without angina pectoris: Secondary | ICD-10-CM | POA: Diagnosis present

## 2023-12-13 DIAGNOSIS — N400 Enlarged prostate without lower urinary tract symptoms: Secondary | ICD-10-CM | POA: Diagnosis present

## 2023-12-13 DIAGNOSIS — Z91048 Other nonmedicinal substance allergy status: Secondary | ICD-10-CM | POA: Diagnosis not present

## 2023-12-13 DIAGNOSIS — Z8249 Family history of ischemic heart disease and other diseases of the circulatory system: Secondary | ICD-10-CM | POA: Diagnosis not present

## 2023-12-13 DIAGNOSIS — Z79899 Other long term (current) drug therapy: Secondary | ICD-10-CM | POA: Diagnosis not present

## 2023-12-13 DIAGNOSIS — Z888 Allergy status to other drugs, medicaments and biological substances status: Secondary | ICD-10-CM | POA: Diagnosis not present

## 2023-12-13 DIAGNOSIS — Z800 Family history of malignant neoplasm of digestive organs: Secondary | ICD-10-CM | POA: Diagnosis not present

## 2023-12-13 LAB — BASIC METABOLIC PANEL WITH GFR
Anion gap: 8 (ref 5–15)
BUN: 13 mg/dL (ref 8–23)
CO2: 25 mmol/L (ref 22–32)
Calcium: 8.8 mg/dL — ABNORMAL LOW (ref 8.9–10.3)
Chloride: 105 mmol/L (ref 98–111)
Creatinine, Ser: 0.69 mg/dL (ref 0.61–1.24)
GFR, Estimated: >60 mL/min (ref >60)
Glucose, Bld: 102 mg/dL — ABNORMAL HIGH (ref 70–99)
Potassium: 3.6 mmol/L (ref 3.5–5.1)
Sodium: 138 mmol/L (ref 135–145)

## 2023-12-13 LAB — CBC
HCT: 41.6 % (ref 39.0–52.0)
Hemoglobin: 14 g/dL (ref 13.0–17.0)
MCH: 31.8 pg (ref 26.0–34.0)
MCHC: 33.7 g/dL (ref 30.0–36.0)
MCV: 94.5 fL (ref 80.0–100.0)
Platelets: 294 K/uL (ref 150–400)
RBC: 4.4 MIL/uL (ref 4.22–5.81)
RDW: 12.1 % (ref 11.5–15.5)
WBC: 10.9 K/uL — ABNORMAL HIGH (ref 4.0–10.5)
nRBC: 0 % (ref 0.0–0.2)

## 2023-12-13 LAB — LACTIC ACID, PLASMA: Lactic Acid, Venous: 1.1 mmol/L (ref 0.5–1.9)

## 2023-12-13 LAB — MAGNESIUM: Magnesium: 2.2 mg/dL (ref 1.7–2.4)

## 2023-12-13 LAB — HIV ANTIBODY (ROUTINE TESTING W REFLEX): HIV Screen 4th Generation wRfx: NONREACTIVE

## 2023-12-13 MED ORDER — PROCHLORPERAZINE EDISYLATE 10 MG/2ML IJ SOLN
5.0000 mg | INTRAMUSCULAR | Status: DC | PRN
  Administered 2023-12-13: 5 mg via INTRAVENOUS
  Filled 2023-12-13: qty 2

## 2023-12-13 MED ORDER — HYDROMORPHONE HCL 1 MG/ML IJ SOLN
1.0000 mg | Freq: Once | INTRAMUSCULAR | Status: AC
  Administered 2023-12-13: 1 mg via INTRAVENOUS
  Filled 2023-12-13: qty 1

## 2023-12-13 MED ORDER — PANTOPRAZOLE SODIUM 40 MG IV SOLR
40.0000 mg | INTRAVENOUS | Status: DC
  Administered 2023-12-13 – 2023-12-14 (×2): 40 mg via INTRAVENOUS
  Filled 2023-12-13 (×2): qty 10

## 2023-12-13 MED ORDER — DIATRIZOATE MEGLUMINE & SODIUM 66-10 % PO SOLN
90.0000 mL | Freq: Once | ORAL | Status: AC
  Administered 2023-12-13: 90 mL via NASOGASTRIC
  Filled 2023-12-13: qty 90

## 2023-12-13 MED ORDER — HYDROMORPHONE HCL 1 MG/ML IJ SOLN
0.5000 mg | INTRAMUSCULAR | Status: DC | PRN
  Administered 2023-12-13 (×5): 1 mg via INTRAVENOUS
  Administered 2023-12-14: 0.5 mg via INTRAVENOUS
  Filled 2023-12-13 (×6): qty 1

## 2023-12-13 MED ORDER — PHENOL 1.4 % MT LIQD
1.0000 | OROMUCOSAL | Status: DC | PRN
  Administered 2023-12-13: 1 via OROMUCOSAL
  Filled 2023-12-13: qty 177

## 2023-12-13 MED ORDER — SODIUM CHLORIDE 0.9% FLUSH
3.0000 mL | Freq: Two times a day (BID) | INTRAVENOUS | Status: DC
Start: 2023-12-13 — End: 2023-12-14
  Administered 2023-12-13 – 2023-12-14 (×2): 3 mL via INTRAVENOUS

## 2023-12-13 MED ORDER — LACTATED RINGERS IV SOLN: INTRAVENOUS | Status: AC

## 2023-12-13 NOTE — Consult Note (Signed)
 Jonathan Burke 16-Apr-1961  [id number].    Requesting MD: Dr. Alban Pepper Chief Complaint/Reason for Consult: SBO  HPI:  This is a 63 yo male with a history of SBOs, thoracic aortic aneurysm followed by Dr. Lucas, OSA resolved with weight loss, recent neck surgery, and GERD who has had an appy, chole, and UHR.  Unfortunately after the hernia repair, he began having SBOs.  He underwent an ex lap in 2014 for LOA.  Since then he has had multiple SBOs that have resolved with conservative management.  He has been eating a lot of cantaloupe and watermelon in one sitting recently.  He has noted that broccoli/apples/and certain foods can cause some increase in abdominal cramping and problems.    Yesterday, while driving to [facility] for work, he developed significant abdominal pain.  This was sharp and worse than any previous obstruction.  He then developed N/V.   He had a BM yesterday morning and no flatus since this weekend.  He drove back to [facility] and presented to [facility] ED for evaluation due to significant pain.  He was found to have a SBO on CT scan with a clear transition point.  His hernias were not the etiology and are soft.  His WBC was initially 13 but down to 10.9K today.  We have been asked to see him.    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   Chest pain    Complication of anesthesia    states is hard to wake up   Dental crowns present    also caps   Dilated aortic root (HCC)    Enlarged prostate    GERD (gastroesophageal reflux disease)    daily OTC   H/O hiatal hernia    pt unsure if  hernia or not, but has a knot in his stomach   Headache(784.0)    tension   PONV (postoperative nausea and vomiting)    Sleep apnea    pt non compliant with CPAP usage   Small bowel obstruction (HCC) 07/17/2012    Past Surgical History:  Procedure Laterality Date   APPENDECTOMY     CARPAL TUNNEL RELEASE  09/09/2011   Procedure: CARPAL TUNNEL RELEASE;  Surgeon: Lamar LULLA Leonor Mickey., MD;  Location: [facility];  Service: Orthopedics;  Laterality: Left;   CHOLECYSTECTOMY     DIRECT LARYNGOSCOPY  07/07/2001   suspension microdirect laryngoscopy with exc. left vocal cord mass   ESOPHAGOGASTRODUODENOSCOPY N/A 06/05/2013   Procedure: ESOPHAGOGASTRODUODENOSCOPY (EGD);  Surgeon: Lynwood LITTIE Celestia Mickey., MD;  Location: [facility] ENDOSCOPY;  Service: Endoscopy;  Laterality: N/A;   LAPAROSCOPIC LYSIS OF ADHESIONS N/A 05/29/2013   Procedure: DIAGNOSTIC LAPAROSCOPY CONVERTED TO EXPLORATORY LAPAROTOMY, LYSIS OF ADHESIONS ;  Surgeon: Vicenta DELENA Poli, MD;  Location: [facility] OR;  Service: General;  Laterality: N/A;   LUMBAR LAMINECTOMY/DECOMPRESSION MICRODISCECTOMY  10/29/1999   L5-S1   REPLACEMENT TOTAL KNEE Right 2020  SHOULDER SURGERY     left   TEE WITHOUT CARDIOVERSION N/A 11/05/2019   Procedure: TRANSESOPHAGEAL ECHOCARDIOGRAM (TEE);  Surgeon: Darron Deatrice LABOR, MD;  Location: [facility] ORS;  Service: Cardiovascular;  Laterality: N/A;    Social History:  reports that he has never smoked. He has never used smokeless tobacco. He reports that he does not drink alcohol and does not use drugs.  Allergies:  Allergies  Allergen Reactions   Adhesive [Tape] Other (See Comments)    Regular tape pulls OFF the skin!! Only paper tape is tolerated.   Quinolones Other (See Comments)    Patient has an ATAA (ascending thoracic aortic aneurysm) and should avoid this class of antibiotics to avoid increased risk of dissection   Atorvastatin  Other (See Comments)    myalgia    Medications Prior to Admission   Medication Sig Dispense Refill   Acetaminophen  500 MG capsule Take 500-1,000 mg by mouth every 6 (six) hours as needed for pain (or headaches).     aspirin EC 81 MG tablet Take 81 mg by mouth daily.     Cholecalciferol (VITAMIN D3) 50 MCG (2000 UT) TABS Take 2,000 Units by mouth daily.     pantoprazole  (PROTONIX ) 20 MG tablet TAKE ONE (1) TABLET BY MOUTH TWO (2) TIMES DAILY (Patient taking differently: Take 20 mg by mouth daily.) 60 tablet 6   PERCOCET 5-325 MG tablet Take 1 tablet by mouth every 4 (four) hours as needed for moderate pain (pain score 4-6) or severe pain (pain score 7-10).     pravastatin  (PRAVACHOL ) 20 MG tablet TAKE ONE (1) TABLET BY MOUTH EVERY DAY (Patient taking differently: Take 20 mg by mouth daily.) 90 tablet 3   tadalafil  (CIALIS ) 5 MG tablet Take 5 mg by mouth daily at 12 noon.     tirzepatide  (ZEPBOUND ) 15 MG/0.5ML Pen Inject 15 mg into the skin once a week. (Patient taking differently: Inject 15 mg into the skin once a week. Tuesday) 6 mL 3   vitamin B-12 (CYANOCOBALAMIN ) 500 MCG tablet Take 500 mcg by mouth daily.     vitamin C (ASCORBIC ACID) 500 MG tablet Take 500 mg by mouth daily.       Physical Exam: Blood pressure 122/77, pulse 90, temperature 97.9 F (36.6 C), temperature source Oral, resp. rate 17, height 5' 8 (1.727 m), weight 79.1 kg, SpO2 98%. General: pleasant, WD, WN white male who is laying in bed in NAD HEENT: head is normocephalic, atraumatic.  Sclera are noninjected.  PERRL.  Ears and nose without any masses or lesions.  Mouth is pink and dry Heart: regular, rate, and rhythm.  Normal s1,s2. No obvious murmurs, gallops, or rubs noted. Lungs: CTAB, no wheezes, rhonchi, or rales noted.  Respiratory effort nonlabored Abd: soft, minimal central tenderness right now after pain meds, ND, +BS, no masses or organomegaly, but multiple incisional hernias noted that soft and reducible.  NGT in place with minimal output currently MS: all 4 extremities are  symmetrical with no cyanosis, clubbing, or edema. Psych: A&Ox3 with an appropriate affect.   Results for orders placed or performed during the hospital encounter of 12/12/23 (from the past 48 hours)  Lipase, blood     Status: None   Collection Time: 12/12/23  8:42 PM  Result Value Ref Range   Lipase 30 11 - 51 U/L    Comment: Performed at [facility], [address], [place], [place] [address]  Comprehensive metabolic panel     Status: Abnormal  Collection Time: 12/12/23  8:42 PM  Result Value Ref Range   Sodium 139 135 - 145 mmol/L   Potassium 3.8 3.5 - 5.1 mmol/L   Chloride 101 98 - 111 mmol/L   CO2 23 22 - 32 mmol/L   Glucose, Bld 120 (H) 70 - 99 mg/dL    Comment: Glucose reference range applies only to samples taken after fasting for at least 8 hours.   BUN 12 8 - 23 mg/dL   Creatinine, Ser 9.07 0.61 - 1.24 mg/dL   Calcium  10.3 8.9 - 10.3 mg/dL   Total Protein 8.7 (H) 6.5 - 8.1 g/dL   Albumin 4.7 3.5 - 5.0 g/dL   AST 22 15 - 41 U/L   ALT 18 0 - 44 U/L   Alkaline Phosphatase 76 38 - 126 U/L   Total Bilirubin 0.7 0.0 - 1.2 mg/dL   GFR, Estimated >39 >39 mL/min    Comment: (NOTE) Calculated using the CKD-EPI Creatinine Equation (2021)    Anion gap 15 5 - 15    Comment: Performed at [facility], [address], [place], [place] [address]  CBC     Status: Abnormal   Collection Time: 12/12/23  8:42 PM  Result Value Ref Range   WBC 13.3 (H) 4.0 - 10.5 K/uL   RBC 4.67 4.22 - 5.81 MIL/uL   Hemoglobin 15.1 13.0 - 17.0 g/dL   HCT 57.2 60.9 - 47.9 %   MCV 91.4 80.0 - 100.0 fL   MCH 32.3 26.0 - 34.0 pg   MCHC 35.4 30.0 - 36.0 g/dL   RDW 87.6 88.4 - 84.4 %   Platelets 329 150 - 400 K/uL   nRBC 0.0 0.0 - 0.2 %    Comment: Performed at [facility], [address] [place], [place] [address]  Lactic acid, plasma     Status: Abnormal   Collection Time: 12/12/23  9:28 PM  Result Value Ref Range   Lactic Acid, Venous  2.0 (HH) 0.5 - 1.9 mmol/L    Comment: Critical Value, Read Back and verified with Signe Louis, RN 12/12/23 2212 EO Performed at [facility], [address] [place], [place] [address]   Lactic acid, plasma     Status: None   Collection Time: 12/12/23 11:04 PM  Result Value Ref Range   Lactic Acid, Venous 1.1 0.5 - 1.9 mmol/L    Comment: Performed at [facility], [address] [place], [place] [address]  Basic metabolic panel     Status: Abnormal   Collection Time: 12/13/23  4:56 AM  Result Value Ref Range   Sodium 138 135 - 145 mmol/L   Potassium 3.6 3.5 - 5.1 mmol/L   Chloride 105 98 - 111 mmol/L   CO2 25 22 - 32 mmol/L   Glucose, Bld 102 (H) 70 - 99 mg/dL    Comment: Glucose reference range applies only to samples taken after fasting for at least 8 hours.   BUN 13 8 - 23 mg/dL   Creatinine, Ser 9.30 0.61 - 1.24 mg/dL   Calcium  8.8 (L) 8.9 - 10.3 mg/dL   GFR, Estimated >39 >39 mL/min    Comment: (NOTE) Calculated using the CKD-EPI Creatinine Equation (2021)    Anion gap 8 5 - 15    Comment: Performed at [facility], [address] [place], [place] [address]  CBC     Status: Abnormal   Collection Time: 12/13/23  4:56 AM  Result Value Ref Range  WBC 10.9 (H) 4.0 - 10.5 K/uL   RBC 4.40 4.22 - 5.81 MIL/uL   Hemoglobin 14.0 13.0 - 17.0 g/dL   HCT 58.3 60.9 - 47.9 %   MCV 94.5 80.0 - 100.0 fL   MCH 31.8 26.0 - 34.0 pg   MCHC 33.7 30.0 - 36.0 g/dL   RDW 87.8 88.4 - 84.4 %   Platelets 294 150 - 400 K/uL   nRBC 0.0 0.0 - 0.2 %    Comment: Performed at [facility], [address] [place], [place] [address]  Magnesium     Status: None   Collection Time: 12/13/23  4:56 AM  Result Value Ref Range   Magnesium 2.2 1.7 - 2.4 mg/dL    Comment: Performed at [facility], [address], [place], [place] [address]   DG Abd Portable 1 View Result Date: 12/13/2023 CLINICAL DATA:   Check gastric catheter placement EXAM: PORTABLE ABDOMEN - 1 VIEW COMPARISON:  None Available. FINDINGS: Gastric catheter is noted within the stomach. Scattered large and small bowel gas is noted. A few loops of mildly dilated small bowel are noted. IMPRESSION: Gastric catheter in the stomach. Persistent small bowel dilatation is noted. Electronically Signed   By: Oneil Devonshire M.D.   On: 12/13/2023 00:05   CT ABDOMEN PELVIS W CONTRAST Result Date: 12/12/2023 CLINICAL DATA:  Bowel obstruction suspected. Nausea, vomiting, abdominal pain. History of small-bowel obstructions EXAM: CT ABDOMEN AND PELVIS WITH CONTRAST TECHNIQUE: Multidetector CT imaging of the abdomen and pelvis was performed using the standard protocol following bolus administration of intravenous contrast. RADIATION DOSE REDUCTION: This exam was performed according to the departmental dose-optimization program which includes automated exposure control, adjustment of the mA and/or kV according to patient size and/or use of iterative reconstruction technique. CONTRAST:  OMNIPAQUE  IOHEXOL  300 MG/ML  SOLN COMPARISON:  CT 08/16/2022 FINDINGS: Lower chest: No acute abnormality. Hepatobiliary: Cholecystectomy. Liver and biliary tree are unremarkable. Pancreas: Unremarkable. Spleen: Unremarkable. Adrenals/Urinary Tract: Normal adrenal glands. No urinary calculi or hydronephrosis. Unremarkable bladder. Stomach/Bowel: Stomach is within normal limits. Marked dilation of the small bowel with air-fluid levels. Abrupt transition point in the anterior mid abdomen where there is associated swirling of the small bowel (series 2/image 46-47). No bowel wall thickening. Normal caliber colon. Vascular/Lymphatic: Aortic atherosclerosis. No enlarged abdominal or pelvic lymph nodes. Reproductive: Unremarkable. Other: No free intraperitoneal air. No abscess. Trace free fluid in the pelvis. 2 fat containing ventral abdominal wall hernias. Fat containing left inguinal  hernia. Musculoskeletal: No acute fracture. IMPRESSION: 1. High-grade small bowel obstruction with abrupt transition point in the anterior mid abdomen where there is associated swirling of the small bowel. Aortic Atherosclerosis (ICD10-I70.0). Electronically Signed   By: Norman Gatlin M.D.   On: 12/12/2023 22:04      Assessment/Plan Recurrent SBO The patient has been seen, examined, labs, vitals, chart, and imaging personally reviewed.  He appears to have a transition point in the anterior aspect of his abdomen.  This appears to not be the same location as his previous obstruction on review of his prior CT scan.  He has been eating large amounts of fibrous food recently, which may be contributing to his obstructive symptoms.  He has an NGT in place for decompression.  We will start the SBO protocol as he has had good success with this in the past.  We did discuss food adjustments and a lower fiber diet, etc to help moving forward.  We would likely to avoid  surgery if possible, but if he were to fail conservative management, he is aware that this may be warranted.  His wife is at the bedside and all of her questions were answered as well.  We will continue to follow.   FEN - NPO/NGT/IVFs VTE - ok for chemical prophylaxis from our standpoint ID - none currently needed  Thoracic aortic aneurysm  GERD Recent neck surgery  I reviewed hospitalist notes, last 24 h vitals and pain scores, last 48 h intake and output, last 24 h labs and trends, and last 24 h imaging results.  Burnard FORBES Banter, [facility] 12/13/2023, 10:16 AM Please see Amion for pager number during day hours 7:00am-4:30pm or 7:00am -11:30am on weekends

## 2023-12-13 NOTE — H&P (Signed)
 History and Physical    Jonathan Burke FMW:[id number] DOB: Jan 03, 1961 DOA: 12/12/2023  PCP: Dettinger, Fonda LABOR, MD   Patient coming from: Home   Chief Complaint: Abdominal pain, N/V    HPI: Jonathan Burke is a 63 y.o. male with medical history significant for CAD, GERD, OSA with CPAP intolerance, ascending thoracic aortic aneurysm followed by cardiothoracic surgery, cervical radiculopathy s/p surgery by Dr. Colon 2 weeks ago, and history of SBO who presents with abdominal pain, nausea, and vomiting.  Patient states that he had been recovering quite well from recent cervical spine surgery but then developed abdominal pain with nausea and vomiting this evening.  Pain is similar to his prior SBOs but much more severe.  He also notes that his abdomen is not as distended as it had been with prior SBO's.  Patient relates that he had symptoms about a month ago that concerned him for developing SBO but he did not seek medical care at that time and the symptoms resolved after a few days of fasting.    He denies any fevers, chills, chest pain, shortness of breath, or cough.  [facility] ED Course: Upon arrival to the ED, patient is found to be afebrile and saturating well on room air with mild tachycardia and stable BP.  Labs are most notable for normal creatinine, normal LFTs and lipase, WBC 13,300, and lactic acid 2.0.  CT is concerning for high-grade SBO.  Surgery (Dr. Tanda) was consulted by the ED physician, NG tube was placed, and the patient was given a liter of LR, 2 doses of Dilaudid , and Zofran .  He was transferred to [facility] for admission.  Review of Systems:  All other systems reviewed and apart from HPI, are negative.  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   Chest pain    Complication of anesthesia    states is hard to wake up   Dental crowns present    also caps    Dilated aortic root (HCC)    Enlarged prostate    GERD (gastroesophageal reflux disease)    daily OTC   H/O hiatal hernia    pt unsure if hernia or not, but has a knot in his stomach   Headache(784.0)    tension   PONV (postoperative nausea and vomiting)    Sleep apnea    pt non compliant with CPAP usage   Small bowel obstruction (HCC) 07/17/2012    Past Surgical History:  Procedure Laterality Date   APPENDECTOMY     CARPAL TUNNEL RELEASE  09/09/2011   Procedure: CARPAL TUNNEL RELEASE;  Surgeon: Lamar LULLA Leonor Mickey., MD;  Location: [facility];  Service: Orthopedics;  Laterality: Left;   CHOLECYSTECTOMY     DIRECT LARYNGOSCOPY  07/07/2001   suspension microdirect laryngoscopy with exc. left vocal cord mass   ESOPHAGOGASTRODUODENOSCOPY N/A 06/05/2013   Procedure: ESOPHAGOGASTRODUODENOSCOPY (EGD);  Surgeon: Lynwood LITTIE Celestia Mickey., MD;  Location: [facility] ENDOSCOPY;  Service: Endoscopy;  Laterality: N/A;   LAPAROSCOPIC LYSIS OF ADHESIONS N/A 05/29/2013   Procedure: DIAGNOSTIC LAPAROSCOPY CONVERTED TO EXPLORATORY LAPAROTOMY, LYSIS OF ADHESIONS ;  Surgeon: Vicenta LABOR Poli, MD;  Location: [facility] OR;  Service: General;  Laterality: N/A;   LUMBAR LAMINECTOMY/DECOMPRESSION MICRODISCECTOMY  10/29/1999   L5-S1   REPLACEMENT TOTAL KNEE Right 2020   SHOULDER SURGERY     left   TEE  WITHOUT CARDIOVERSION N/A 11/05/2019   Procedure: TRANSESOPHAGEAL ECHOCARDIOGRAM (TEE);  Surgeon: Darron Deatrice LABOR, MD;  Location: [facility] ORS;  Service: Cardiovascular;  Laterality: N/A;    Social History:   reports that he has never smoked. He has never used smokeless tobacco. He reports that he does not drink alcohol and does not use drugs.  Allergies  Allergen Reactions   Adhesive [Tape] Other (See Comments)    Regular tape pulls OFF the skin!! Only paper tape is tolerated.   Atorvastatin  Other (See Comments)    Made the patient's bones hurt   Quinolones     Patient has an ATAA (ascending thoracic  aortic aneurysm) and should avoid this class of antibiotics to avoid increased risk of dissection    Family History  Problem Relation Age of Onset   COPD Mother    Heart disease Mother    Rectal cancer Mother    Heart attack Father    Arthritis Sister    Hypertension Brother    Hypertension Sister    Anuerysm Sister    Stroke Sister    Arthritis Sister        RA     Prior to Admission medications   Medication Sig Start Date End Date Taking? Authorizing Provider  Acetaminophen  500 MG capsule Take 500-1,000 mg by mouth every 6 (six) hours as needed for pain (or headaches).    [provider]  ARTIFICIAL TEARS PF 0.1-0.3 % SOLN Place 1 drop into both eyes 3 (three) times daily as needed (for dryness). Patient not taking: Reported on 11/09/2023    [provider]  aspirin EC 81 MG tablet Take 81 mg by mouth daily.    [provider]  cetirizine  (ZYRTEC ) 10 MG tablet Take 1 tablet (10 mg total) by mouth daily. 12/14/22   [facility] Sebastian Pool, NP  Cholecalciferol (VITAMIN D3) 50 MCG (2000 UT) TABS Take 2,000 Units by mouth daily.    [provider]  metoprolol  tartrate (LOPRESSOR ) 50 MG tablet Take one tablet two hours prior to the test Patient not taking: Reported on 11/09/2023 07/26/23   Arida, Muhammad A, MD  ondansetron  (ZOFRAN -ODT) 4 MG disintegrating tablet Take 1 tablet (4 mg total) by mouth every 8 (eight) hours as needed for nausea or vomiting. Patient not taking: Reported on 11/09/2023 08/16/22   Harris, Abigail, PA-C  pantoprazole  (PROTONIX ) 20 MG tablet TAKE ONE (1) TABLET BY MOUTH TWO (2) TIMES DAILY 07/27/23   Darron Deatrice LABOR, MD  pravastatin  (PRAVACHOL ) 20 MG tablet TAKE ONE (1) TABLET BY MOUTH EVERY DAY 09/09/23   Darron Deatrice LABOR, MD  sucralfate  (CARAFATE ) 1 g tablet Take 1 tablet (1 g total) by mouth 4 (four) times daily -  with meals and at bedtime. Patient not taking: Reported on 11/09/2023 08/16/22   Harris, Abigail, PA-C   tirzepatide  (ZEPBOUND ) 15 MG/0.5ML Pen Inject 15 mg into the skin once a week. 10/13/23   Dettinger, Fonda LABOR, MD  vitamin B-12 (CYANOCOBALAMIN ) 500 MCG tablet Take 500-1,000 mcg by mouth daily.    [provider]  vitamin C (ASCORBIC ACID) 500 MG tablet Take 500 mg by mouth daily.    [provider]  zinc gluconate 50 MG tablet Take 50 mg by mouth daily.    [provider]    Physical Exam: Vitals:   12/13/23 0300 12/13/23 0315 12/13/23 0334 12/13/23 0414  BP: 102/86 107/80  122/87  Pulse: 97 98  91  Resp: 10 12  17  Temp:  98 F (36.7 C) 99 F (37.2 C) 97.8 F (36.6 C)  TempSrc:  Oral Oral   SpO2: 98% 98%  98%    Constitutional: NAD, calm  Eyes: PERTLA, lids and conjunctivae normal ENMT: Mucous membranes are moist. Posterior pharynx clear of any exudate or lesions.   Neck: supple, surgical site at left anterior neck with mild edema and erythema  Respiratory: no wheezing, no crackles. No accessory muscle use.  Cardiovascular: S1 & S2 heard, regular rate and rhythm. No extremity edema.  Abdomen: Tender, voluntary guarding. Frequent high-pitched bowel sounds.  Musculoskeletal: no clubbing / cyanosis. No joint deformity upper and lower extremities.   Skin: Anterior left neck with mild edema and erythema at surgical site. Otherwise warm, dry, well-perfused. Neurologic: CN 2-12 grossly intact. Moving all extremities. Alert and oriented.  Psychiatric: Pleasant. Cooperative.    Labs and Imaging on Admission: I have personally reviewed following labs and imaging studies  CBC: Recent Labs  Lab 12/12/23 2042  WBC 13.3*  HGB 15.1  HCT 42.7  MCV 91.4  PLT 329   Basic Metabolic Panel: Recent Labs  Lab 12/12/23 2042  NA 139  K 3.8  CL 101  CO2 23  GLUCOSE 120*  BUN 12  CREATININE 0.92  CALCIUM  10.3   GFR: CrCl cannot be calculated (Unknown ideal weight.). Liver Function Tests: Recent Labs  Lab 12/12/23 2042  AST 22  ALT 18  ALKPHOS 76   BILITOT 0.7  PROT 8.7*  ALBUMIN 4.7   Recent Labs  Lab 12/12/23 2042  LIPASE 30   No results for input(s): AMMONIA in the last 168 hours. Coagulation Profile: No results for input(s): INR, PROTIME in the last 168 hours. Cardiac Enzymes: No results for input(s): CKTOTAL, CKMB, CKMBINDEX, TROPONINI in the last 168 hours. BNP (last 3 results) No results for input(s): PROBNP in the last 8760 hours. HbA1C: No results for input(s): HGBA1C in the last 72 hours. CBG: No results for input(s): GLUCAP in the last 168 hours. Lipid Profile: No results for input(s): CHOL, HDL, LDLCALC, TRIG, CHOLHDL, LDLDIRECT in the last 72 hours. Thyroid  Function Tests: No results for input(s): TSH, T4TOTAL, FREET4, T3FREE, THYROIDAB in the last 72 hours. Anemia Panel: No results for input(s): VITAMINB12, FOLATE, FERRITIN, TIBC, IRON, RETICCTPCT in the last 72 hours. Urine analysis:    Component Value Date/Time   COLORURINE YELLOW 08/16/2022 1714   APPEARANCEUR HAZY (A) 08/16/2022 1714   LABSPEC 1.031 (H) 08/16/2022 1714   PHURINE 5.5 08/16/2022 1714   GLUCOSEU NEGATIVE 08/16/2022 1714   HGBUR SMALL (A) 08/16/2022 1714   BILIRUBINUR SMALL (A) 08/16/2022 1714   BILIRUBINUR NEG 04/16/2014 0905   KETONESUR 40 (A) 08/16/2022 1714   PROTEINUR 30 (A) 08/16/2022 1714   UROBILINOGEN negative 04/16/2014 0905   UROBILINOGEN 1.0 07/17/2012 1933   NITRITE NEGATIVE 08/16/2022 1714   LEUKOCYTESUR NEGATIVE 08/16/2022 1714   Sepsis Labs: @LABRCNTIP (procalcitonin:4,lacticidven:4) )No results found for this or any previous visit (from the past 240 hours).   Radiological Exams on Admission: DG Abd Portable 1 View Result Date: 12/13/2023 CLINICAL DATA:  Check gastric catheter placement EXAM: PORTABLE ABDOMEN - 1 VIEW COMPARISON:  None Available. FINDINGS: Gastric catheter is noted within the stomach. Scattered large and small bowel gas is noted. A few loops of  mildly dilated small bowel are noted. IMPRESSION: Gastric catheter in the stomach. Persistent small bowel dilatation is noted. Electronically Signed   By: Oneil Devonshire M.D.   On: 12/13/2023 00:05   CT  ABDOMEN PELVIS W CONTRAST Result Date: 12/12/2023 CLINICAL DATA:  Bowel obstruction suspected. Nausea, vomiting, abdominal pain. History of small-bowel obstructions EXAM: CT ABDOMEN AND PELVIS WITH CONTRAST TECHNIQUE: Multidetector CT imaging of the abdomen and pelvis was performed using the standard protocol following bolus administration of intravenous contrast. RADIATION DOSE REDUCTION: This exam was performed according to the departmental dose-optimization program which includes automated exposure control, adjustment of the mA and/or kV according to patient size and/or use of iterative reconstruction technique. CONTRAST:  OMNIPAQUE  IOHEXOL  300 MG/ML  SOLN COMPARISON:  CT 08/16/2022 FINDINGS: Lower chest: No acute abnormality. Hepatobiliary: Cholecystectomy. Liver and biliary tree are unremarkable. Pancreas: Unremarkable. Spleen: Unremarkable. Adrenals/Urinary Tract: Normal adrenal glands. No urinary calculi or hydronephrosis. Unremarkable bladder. Stomach/Bowel: Stomach is within normal limits. Marked dilation of the small bowel with air-fluid levels. Abrupt transition point in the anterior mid abdomen where there is associated swirling of the small bowel (series 2/image 46-47). No bowel wall thickening. Normal caliber colon. Vascular/Lymphatic: Aortic atherosclerosis. No enlarged abdominal or pelvic lymph nodes. Reproductive: Unremarkable. Other: No free intraperitoneal air. No abscess. Trace free fluid in the pelvis. 2 fat containing ventral abdominal wall hernias. Fat containing left inguinal hernia. Musculoskeletal: No acute fracture. IMPRESSION: 1. High-grade small bowel obstruction with abrupt transition point in the anterior mid abdomen where there is associated swirling of the small bowel. Aortic  Atherosclerosis (ICD10-I70.0). Electronically Signed   By: Norman Gatlin M.D.   On: 12/12/2023 22:04    Assessment/Plan   1. SBO  - Continue bowel rest, NGT decompression, IVF hydration, monitor/correct electrolytes, follow-up on surgeon's recommendations    2. CAD - No anginal symptoms    3. Leukocytosis  - Likely reactive, no apparent infection   - Repeat CBC in am, culture if febrile   4. Cervical radiculopathy  - S/p surgery 2 weeks ago, pt states he is recovering well  5. Ascending thoracic aortic aneurysm  - Asymptomatic, followed by cardiothoracic surgery   DVT prophylaxis: SCDs  Code Status: Full  Level of Care: Level of care: Telemetry Family Communication: Wife at bedside  Disposition Plan:  Patient is from: Home  Anticipated d/c is to: TBD Anticipated d/c date is: 12/16/23  Patient currently: Pending return of bowel function  Consults called: Surgery  Admission status: Inpatient     Evalene GORMAN Sprinkles, MD [facility]  12/13/2023, 4:32 AM

## 2023-12-13 NOTE — Progress Notes (Signed)
 Progress Note   Patient: Jonathan Burke FMW:[id number] DOB: 26-Feb-1961 DOA: 12/12/2023     0 DOS: the patient was seen and examined on 12/13/2023   Brief hospital course: Admission HPI  HPI: Jonathan Burke is a 63 y.o. male with medical history significant for CAD, GERD, OSA with CPAP intolerance, ascending thoracic aortic aneurysm followed by cardiothoracic surgery, cervical radiculopathy s/p surgery by Dr. Colon 2 weeks ago, and history of SBO who presents with abdominal pain, nausea, and vomiting.   Patient states that he had been recovering quite well from recent cervical spine surgery but then developed abdominal pain with nausea and vomiting this evening.  Pain is similar to his prior SBOs but much more severe.  He also notes that his abdomen is not as distended as it had been with prior SBO's.  Patient relates that he had symptoms about a month ago that concerned him for developing SBO but he did not seek medical care at that time and the symptoms resolved after a few days of fasting.     He denies any fevers, chills, chest pain, shortness of breath, or cough.   [facility] ED Course: Upon arrival to the ED, patient is found to be afebrile and saturating well on room air with mild tachycardia and stable BP.  Labs are most notable for normal creatinine, normal LFTs and lipase, WBC 13,300, and lactic acid 2.0.  CT is concerning for high-grade SBO.   Surgery (Dr. Tanda) was consulted by the ED physician, NG tube was placed, and the patient was given a liter of LR, 2 doses of Dilaudid , and Zofran .  He was transferred to [facility] for admission.  Assessment and Plan: SBO  Patient reports multiple episodes in the past. Has had cholecystectomy, appendectomy, and exploratory lapartomy in the past.  Gen surg c/s Continue NGT   CAD  No anginal symptoms, hold medications. For now Aspirin and statin  Cervical radiculopathy S/p surgery ~2 weeks ago Recovering  well.  Ascending TAA Followed by CT surgery         Subjective: Pain improved with NGT   Physical Exam: Vitals:   12/13/23 0436 12/13/23 0800 12/13/23 1509 12/13/23 1511  BP:  122/77  95/67  Pulse:  90  70  Resp:    16  Temp:  97.9 F (36.6 C) 97.7 F (36.5 C)   TempSrc:  Oral Oral   SpO2:  98%  96%  Weight: 79.1 kg     Height: 5' 8 (1.727 m)      Physical Exam  Constitutional: In no distress.  NGT in place  Cardiovascular: Normal rate, regular rhythm. No lower extremity edema  Pulmonary: Non labored breathing on room air, no wheezing or rales.    Abdominal: Soft. Normal bowel sounds. Non distended and non tender Musculoskeletal: Normal range of motion.     Neurological: Alert and oriented to person, place, and time. Non focal  Skin: Skin is warm and dry.   Data Reviewed:     Latest Ref Rng & Units 12/13/2023    4:56 AM 12/12/2023    8:42 PM 10/13/2023   10:08 AM  BMP  Glucose 70 - 99 mg/dL 897  879  81   BUN 8 - 23 mg/dL 13  12  14    Creatinine 0.61 - 1.24 mg/dL 9.30  9.07  9.15   BUN/Creat Ratio 10 - 24   17   Sodium 135 - 145 mmol/L 138  139  140  Potassium 3.5 - 5.1 mmol/L 3.6  3.8  4.7   Chloride 98 - 111 mmol/L 105  101  104   CO2 22 - 32 mmol/L 25  23  23    Calcium  8.9 - 10.3 mg/dL 8.8  89.6  9.2       Latest Ref Rng & Units 12/13/2023    4:56 AM 12/12/2023    8:42 PM 10/13/2023   10:08 AM  CBC  WBC 4.0 - 10.5 K/uL 10.9  13.3  5.6   Hemoglobin 13.0 - 17.0 g/dL 85.9  84.8  85.3   Hematocrit 39.0 - 52.0 % 41.6  42.7  44.2   Platelets 150 - 400 K/uL 294  329  230    WBC improved.   Family Communication: Discussed plan with patient and he verablized understanding.   Disposition: Status is: Inpatient Remains inpatient appropriate because: IV fluids management of SBO   Planned Discharge Destination: Home    Time spent: 35 minutes  Author: Alban Pepper, MD 12/13/2023 6:14 PM  For on call review [email or web address].

## 2023-12-13 NOTE — TOC Initial Note (Signed)
 Transition of Care [facility]) - Initial/Assessment Note    Patient Details  Name: Jonathan Burke MRN: [id number] Date of Birth: 07-Apr-1961  [facility] CM/SW Contact:    Bascom Service, RN Phone Number: 12/13/2023, 2:20 PM  Clinical Narrative:  d/c plan home                 Expected Discharge Plan: Home/Self Care Barriers to Discharge: Continued Medical Work up   Patient Goals and CMS Choice            Expected Discharge Plan and Services   Discharge Planning Services: CM Consult   Living arrangements for the past 2 months: Single Family Home                                      Prior Living Arrangements/Services Living arrangements for the past 2 months: Single Family Home Lives with:: Spouse                   Activities of Daily Living   ADL Screening (condition at time of admission) Independently performs ADLs?: Yes (appropriate for developmental age) Is the patient deaf or have difficulty hearing?: No Does the patient have difficulty seeing, even when wearing glasses/contacts?: No Does the patient have difficulty concentrating, remembering, or making decisions?: No  Permission Sought/Granted                  Emotional Assessment              Admission diagnosis:  SBO (small bowel obstruction) (HCC) [K56.609] Patient Active Problem List   Diagnosis Date Noted   Cervical radiculopathy 12/13/2023   SBO (small bowel obstruction) (HCC) 12/12/2023   Nasal congestion with rhinorrhea 12/14/2022   Sore throat 12/14/2022   Acute cough 12/14/2022   Morbid obesity (HCC) 06/24/2022   Bicuspid aortic valve    Thoracic aortic aneurysm without rupture (HCC) 09/25/2018   Essential hypertension 07/31/2018   Coronary artery disease involving native coronary artery of native heart without angina pectoris 07/31/2018   Aneurysm, ascending aorta 07/31/2018   Pulsatile tinnitus 07/14/2018   OSA (obstructive sleep apnea) 04/13/2018    Dyslipidemia (high LDL; low HDL) 09/05/2012   BPH (benign prostatic hyperplasia) 09/05/2012   GERD (gastroesophageal reflux disease) 07/17/2012   Obesity (BMI 30-39.9) 07/17/2012   PCP:  Dettinger, Fonda LABOR, MD Pharmacy:   [facility] [place] [place] - [address] [place] [address] Phone: [phone] Fax: [phone]     Social Drivers of Health (SDOH) Social History: SDOH Screenings   Food Insecurity: No Food Insecurity (12/13/2023)  Housing: Low Risk  (12/13/2023)  Transportation Needs: No Transportation Needs (12/13/2023)  Utilities: Not At Risk (12/13/2023)  Depression (PHQ2-9): Low Risk  (10/13/2023)  Tobacco Use: Low Risk  (12/13/2023)   SDOH Interventions:     Readmission Risk Interventions     No data to display

## 2023-12-14 DIAGNOSIS — K56609 Unspecified intestinal obstruction, unspecified as to partial versus complete obstruction: Secondary | ICD-10-CM | POA: Diagnosis not present

## 2023-12-14 LAB — CBC
HCT: 39.7 % (ref 39.0–52.0)
Hemoglobin: 13.4 g/dL (ref 13.0–17.0)
MCH: 32.1 pg (ref 26.0–34.0)
MCHC: 33.8 g/dL (ref 30.0–36.0)
MCV: 95 fL (ref 80.0–100.0)
Platelets: 261 K/uL (ref 150–400)
RBC: 4.18 MIL/uL — ABNORMAL LOW (ref 4.22–5.81)
RDW: 12 % (ref 11.5–15.5)
WBC: 12.8 K/uL — ABNORMAL HIGH (ref 4.0–10.5)
nRBC: 0 % (ref 0.0–0.2)

## 2023-12-14 LAB — BASIC METABOLIC PANEL WITH GFR
Anion gap: 11 (ref 5–15)
BUN: 17 mg/dL (ref 8–23)
CO2: 25 mmol/L (ref 22–32)
Calcium: 8.7 mg/dL — ABNORMAL LOW (ref 8.9–10.3)
Chloride: 100 mmol/L (ref 98–111)
Creatinine, Ser: 0.57 mg/dL — ABNORMAL LOW (ref 0.61–1.24)
GFR, Estimated: >60 mL/min (ref >60)
Glucose, Bld: 88 mg/dL (ref 70–99)
Potassium: 3.6 mmol/L (ref 3.5–5.1)
Sodium: 136 mmol/L (ref 135–145)

## 2023-12-14 LAB — MAGNESIUM: Magnesium: 2 mg/dL (ref 1.7–2.4)

## 2023-12-14 LAB — PHOSPHORUS: Phosphorus: 2.9 mg/dL (ref 2.5–4.6)

## 2023-12-14 MED ORDER — ACETAMINOPHEN 325 MG PO TABS
650.0000 mg | ORAL_TABLET | Freq: Four times a day (QID) | ORAL | Status: DC | PRN
  Administered 2023-12-14: 650 mg via ORAL
  Filled 2023-12-14: qty 2

## 2023-12-14 NOTE — Progress Notes (Signed)
 Pt ambulated to bathroom and reported 1 BM after drinking a sprite.

## 2023-12-14 NOTE — Discharge Summary (Signed)
 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  Principal Problem:   SBO (small bowel obstruction)  - Found to have high-grade obstruction with a transition point in the anterior mid abdomen - NG tube placed in emergency department and general surgery consulted - Steadily improved with conservative management-having bowel movements and able to transition to solid food without complications  Active Problems: CAD - Continue aspirin and statin  Ascending thoracic aortic aneurysm - Continue outpatient surveillance         Discharge Instructions   Allergies as of 12/14/2023       Reactions   Adhesive [tape] Other (See Comments)   Regular tape pulls OFF the skin!! Only paper tape is tolerated.   Quinolones Other (See Comments)   Patient has an ATAA (ascending thoracic aortic aneurysm) and should avoid this class of antibiotics to avoid increased risk of dissection   Atorvastatin  Other (See Comments)   myalgia        Medication List     TAKE these medications    Acetaminophen  500 MG capsule Take 500-1,000 mg by mouth every 6 (six) hours as needed for pain (or headaches).   ascorbic acid 500 MG tablet Commonly known as: VITAMIN C Take 500 mg by mouth daily.   aspirin EC 81 MG tablet Take 81 mg by mouth daily.    cyanocobalamin  500 MCG tablet Commonly known as: VITAMIN B12 Take 500 mcg by mouth daily.   pantoprazole  20 MG tablet Commonly known as: PROTONIX  TAKE ONE (1) TABLET BY MOUTH TWO (2) TIMES DAILY What changed: See the new instructions.   Percocet 5-325 MG tablet Generic drug: oxyCODONE -acetaminophen  Take 1 tablet by mouth every 4 (four) hours as needed for moderate pain (pain score 4-6) or severe pain (pain score 7-10).   pravastatin  20 MG tablet Commonly known as: PRAVACHOL  TAKE ONE (1) TABLET BY MOUTH EVERY DAY What changed: how much to take   tadalafil  5 MG tablet Commonly known as: CIALIS  Take 5 mg by mouth daily at 12 noon.   Vitamin D3 50 MCG (2000 UT) Tabs Take 2,000 Units by mouth daily.   Zepbound  15 MG/0.5ML Pen Generic drug: tirzepatide  Inject 15 mg into the skin once a week. What changed: additional instructions            The results of significant diagnostics from this hospitalization (including imaging, microbiology, ancillary and laboratory) are listed below for reference.    DG Abd Portable 1V-Small Bowel Obstruction Protocol-initial, 8 hr delay Result Date: 12/13/2023 CLINICAL DATA:  Small-bowel follow-through 8 hour delayed EXAM: PORTABLE ABDOMEN - 1 VIEW COMPARISON:  Abdominal x-ray 12/12/2023 FINDINGS: Oral contrast is seen within nondilated distal small bowel and throughout nondilated colon to the level the rectum. No dilated bowel loops are visualized. Enteric tube tip is in the distal stomach. IMPRESSION: Nonobstructive bowel gas pattern. Oral contrast is seen to the level of the rectum. Electronically Signed  By: Greig Pique M.D.   On: 12/13/2023 19:41   DG Abd Portable 1 View Result Date: 12/13/2023 CLINICAL DATA:  Check gastric catheter placement EXAM: PORTABLE ABDOMEN - 1 VIEW COMPARISON:  None Available. FINDINGS: Gastric catheter is noted within the stomach. Scattered large and small bowel gas is noted. A few loops of mildly dilated small  bowel are noted. IMPRESSION: Gastric catheter in the stomach. Persistent small bowel dilatation is noted. Electronically Signed   By: Oneil Devonshire M.D.   On: 12/13/2023 00:05   CT ABDOMEN PELVIS W CONTRAST Result Date: 12/12/2023 CLINICAL DATA:  Bowel obstruction suspected. Nausea, vomiting, abdominal pain. History of small-bowel obstructions EXAM: CT ABDOMEN AND PELVIS WITH CONTRAST TECHNIQUE: Multidetector CT imaging of the abdomen and pelvis was performed using the standard protocol following bolus administration of intravenous contrast. RADIATION DOSE REDUCTION: This exam was performed according to the departmental dose-optimization program which includes automated exposure control, adjustment of the mA and/or kV according to patient size and/or use of iterative reconstruction technique. CONTRAST:  OMNIPAQUE  IOHEXOL  300 MG/ML  SOLN COMPARISON:  CT 08/16/2022 FINDINGS: Lower chest: No acute abnormality. Hepatobiliary: Cholecystectomy. Liver and biliary tree are unremarkable. Pancreas: Unremarkable. Spleen: Unremarkable. Adrenals/Urinary Tract: Normal adrenal glands. No urinary calculi or hydronephrosis. Unremarkable bladder. Stomach/Bowel: Stomach is within normal limits. Marked dilation of the small bowel with air-fluid levels. Abrupt transition point in the anterior mid abdomen where there is associated swirling of the small bowel (series 2/image 46-47). No bowel wall thickening. Normal caliber colon. Vascular/Lymphatic: Aortic atherosclerosis. No enlarged abdominal or pelvic lymph nodes. Reproductive: Unremarkable. Other: No free intraperitoneal air. No abscess. Trace free fluid in the pelvis. 2 fat containing ventral abdominal wall hernias. Fat containing left inguinal hernia. Musculoskeletal: No acute fracture. IMPRESSION: 1. High-grade small bowel obstruction with abrupt transition point in the anterior mid abdomen where there is associated swirling of the small bowel. Aortic Atherosclerosis  (ICD10-I70.0). Electronically Signed   By: Norman Gatlin M.D.   On: 12/12/2023 22:04   Labs:   Basic Metabolic Panel: Recent Labs  Lab 12/12/23 2042 12/13/23 0456 12/14/23 0500  NA 139 138 136  K 3.8 3.6 3.6  CL 101 105 100  CO2 23 25 25   GLUCOSE 120* 102* 88  BUN 12 13 17   CREATININE 0.92 0.69 0.57*  CALCIUM  10.3 8.8* 8.7*  MG  --  2.2 2.0  PHOS  --   --  2.9     CBC: Recent Labs  Lab 12/12/23 2042 12/13/23 0456 12/14/23 0500  WBC 13.3* 10.9* 12.8*  HGB 15.1 14.0 13.4  HCT 42.7 41.6 39.7  MCV 91.4 94.5 95.0  PLT 329 294 261         SIGNED:   True Atlas, MD  [facility] 12/14/2023, 9:32 PM Time taking on discharge: 50 minutes

## 2023-12-14 NOTE — Progress Notes (Signed)
 Progress Note: General Surgery Service   Chief Complaint/Subjective: Passing some gas and feeling much better this morning.  NG output has decreased some.  Objective: Vital signs in last 24 hours: Temp:  [97.7 F (36.5 C)-98.7 F (37.1 C)] 98.7 F (37.1 C) (07/16 0451) Pulse Rate:  [70-84] 84 (07/16 0451) Resp:  [14-20] 14 (07/16 0451) BP: (95-119)/(67-70) 119/69 (07/16 0451) SpO2:  [96 %-98 %] 96 % (07/16 0451) Last BM Date : 12/12/23  Intake/Output from previous day: 07/15 0701 - 07/16 0700 In: 1147.3 [I.V.:967.3; NG/GT:180] Out: 1300 [Emesis/NG output:1300] Intake/Output this shift: No intake/output data recorded.  GI: Abd soft, nontender  Lab Results: CBC  Recent Labs    12/13/23 0456 12/14/23 0500  WBC 10.9* 12.8*  HGB 14.0 13.4  HCT 41.6 39.7  PLT 294 261   BMET Recent Labs    12/13/23 0456 12/14/23 0500  NA 138 136  K 3.6 3.6  CL 105 100  CO2 25 25  GLUCOSE 102* 88  BUN 13 17  CREATININE 0.69 0.57*  CALCIUM  8.8* 8.7*   PT/INR No results for input(s): LABPROT, INR in the last 72 hours. ABG No results for input(s): PHART, HCO3 in the last 72 hours.  Invalid input(s): PCO2, PO2  Anti-infectives: Anti-infectives (From admission, onward)    None       Medications: Scheduled Meds:  pantoprazole  (PROTONIX ) IV  40 mg Intravenous Q24H   sodium chloride  flush  3 mL Intravenous Q12H   Continuous Infusions: PRN Meds:.HYDROmorphone  (DILAUDID ) injection, phenol, prochlorperazine   Assessment/Plan:   Mr. 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  At this point his bowel obstruction appears to have resolved clinically and on imaging.  I recommend removal of the nasogastric tube and advancing his diet.  He can try liquids for breakfast and lunch and solid food for dinner.  If he does well with dinner and he has bowel movements today he can go home this evening.   LOS: 1 day      Deward JINNY Foy, MD  [facility] Use [email or web address] to contact on call provider  Daily Billing: 00767 - Moderate MDM

## 2023-12-15 ENCOUNTER — Telehealth: Payer: Self-pay | Admitting: *Deleted

## 2023-12-15 NOTE — Transitions of Care (Post Inpatient/ED Visit) (Signed)
   12/15/2023  Name: Jonathan Burke MRN: [id number] DOB: May 14, 1961  Today's TOC FU Call Status: Today's TOC FU Call Status:: Unsuccessful Call (1st Attempt) Unsuccessful Call (1st Attempt) Date: 12/15/23  Attempted to reach the patient regarding the most recent Inpatient/ED visit.  Follow Up Plan: Additional outreach attempts will be made to reach the patient to complete the Transitions of Care (Post Inpatient/ED visit) call.   Andrea Dimes RN, BSN [place]  [facility] RN Care Manager [phone]

## 2023-12-16 ENCOUNTER — Telehealth: Payer: Self-pay | Admitting: *Deleted

## 2023-12-16 NOTE — Transitions of Care (Post Inpatient/ED Visit) (Signed)
   12/16/2023  Name: Jonathan Burke MRN: [id number] DOB: 03-25-1961  Today's TOC FU Call Status: Today's TOC FU Call Status:: Unsuccessful Call (2nd Attempt) Unsuccessful Call (2nd Attempt) Date: 12/16/23  Attempted to reach the patient regarding the most recent Inpatient/ED visit.  Follow Up Plan: Additional outreach attempts will be made to reach the patient to complete the Transitions of Care (Post Inpatient/ED visit) call.   Andrea Dimes RN, BSN   [facility] RN Care Manager [phone]

## 2023-12-19 ENCOUNTER — Telehealth: Payer: Self-pay | Admitting: *Deleted

## 2023-12-19 NOTE — Transitions of Care (Post Inpatient/ED Visit) (Signed)
 12/19/2023  Name: Jonathan Burke MRN: [id number] DOB: [birth year]  Today's TOC FU Call Status: Today's TOC FU Call Status:: Successful TOC FU Call Completed TOC FU Call Complete Date: 12/19/23 Patient's Name and Date of Birth confirmed.  Transition Care Management Follow-up Telephone Call Date of Discharge: 12/14/23 Discharge Facility: [facility]) Type of Discharge: Inpatient Admission Primary Inpatient Discharge Diagnosis:: SBO How have you been since you were released from the hospital?: Better Any questions or concerns?: No  Items Reviewed: Did you receive and understand the discharge instructions provided?: Yes Medications obtained,verified, and reconciled?: Yes (Medications Reviewed) Any new allergies since your discharge?: No Dietary orders reviewed?: Yes Type of Diet Ordered:: low fiber Do you have support at home?: Yes People in Home [RPT]: spouse Name of Support/Comfort Primary Source: Jonathan Burke  Medications Reviewed Today: Medications Reviewed Today     Reviewed by Lucky Andrea LABOR, RN (Registered Nurse) on 12/19/23 at 1313  Med List Status: <None>   Medication Order Taking? Sig Documenting Provider Last Dose Status Informant  Acetaminophen  500 MG capsule [id number] Yes Take 500-1,000 mg by mouth every 6 (six) hours as needed for pain (or headaches). [provider]  Active Self, Pharmacy Records, Multiple Informants  aspirin EC 81 MG tablet [id number] Yes Take 81 mg by mouth daily. [provider]  Active Self, Pharmacy Records, Multiple Informants  Cholecalciferol (VITAMIN D3) 50 MCG (2000 UT) TABS [id number] Yes Take 2,000 Units by mouth daily. [provider]  Active Self, Pharmacy Records, Multiple Informants  pantoprazole  (PROTONIX ) 20 MG tablet [id number] Yes TAKE ONE (1) TABLET BY MOUTH TWO (2) TIMES DAILY Darron Deatrice LABOR, MD  Active Self, Pharmacy Records, Multiple Informants  PERCOCET 5-325 MG tablet [id number]  Take 1 tablet by  mouth every 4 (four) hours as needed for moderate pain (pain score 4-6) or severe pain (pain score 7-10).  Patient not taking: Reported on 12/19/2023   [provider]  Active Self, Pharmacy Records, Multiple Informants  pravastatin  (PRAVACHOL ) 20 MG tablet [id number] Yes TAKE ONE (1) TABLET BY MOUTH EVERY DAY Darron Deatrice LABOR, MD  Active Self, Pharmacy Records, Multiple Informants  tadalafil  (CIALIS ) 5 MG tablet [id number] Yes Take 5 mg by mouth daily at 12 noon. [provider]  Active Self, Pharmacy Records, Multiple Informants  tirzepatide  (ZEPBOUND ) 15 MG/0.5ML Pen [id number]  Inject 15 mg into the skin once a week.  Patient not taking: Reported on 12/19/2023   Dettinger, Fonda LABOR, MD  Active Self, Pharmacy Records, Multiple Informants  vitamin B-12 (CYANOCOBALAMIN ) 500 MCG tablet [id number] Yes Take 500 mcg by mouth daily. [provider]  Active Self, Pharmacy Records, Multiple Informants  vitamin C (ASCORBIC ACID) 500 MG tablet [id number] Yes Take 500 mg by mouth daily. [provider]  Active Self, Pharmacy Records, Multiple Informants            Home Care and Equipment/Supplies: Were Home Health Services Ordered?: No Any new equipment or medical supplies ordered?: No  Functional Questionnaire: Do you need assistance with bathing/showering or dressing?: No Do you need assistance with meal preparation?: No Do you need assistance with eating?: No Do you have difficulty maintaining continence: No Do you need assistance with getting out of bed/getting out of a chair/moving?: No Do you have difficulty managing or taking your medications?: No  Follow up appointments reviewed: PCP Follow-up appointment confirmed?: No (Patient will call to schedule a PCP hospital follow up visit) MD Provider Line [phone] Given: No Specialist Hospital Follow-up appointment  confirmed?: NA Do you need transportation to your follow-up appointment?: No Do you  understand care options if your condition(s) worsen?: Yes-patient verbalized understanding    Andrea Dimes RN, BSN [place]  [facility] RN Care Manager [phone]

## 2023-12-28 DIAGNOSIS — M5412 Radiculopathy, cervical region: Secondary | ICD-10-CM | POA: Diagnosis not present

## 2024-02-06 ENCOUNTER — Other Ambulatory Visit (HOSPITAL_COMMUNITY): Payer: Self-pay

## 2024-02-27 ENCOUNTER — Telehealth: Payer: Self-pay

## 2024-02-27 ENCOUNTER — Other Ambulatory Visit (HOSPITAL_COMMUNITY): Payer: Self-pay

## 2024-02-27 NOTE — Telephone Encounter (Signed)
 Pharmacy Patient Advocate Encounter   Received notification from Onbase that prior authorization for Zepbound  15MG /0.5ML pen-injectors  is required/requested.   Insurance verification completed.   The patient is insured through [facility] .   Per test claim: PA required; PA submitted to above mentioned insurance via Latent Key/confirmation #/EOC [id number] Status is pending

## 2024-02-28 ENCOUNTER — Other Ambulatory Visit (HOSPITAL_COMMUNITY): Payer: Self-pay

## 2024-02-28 NOTE — Telephone Encounter (Signed)
 Pharmacy Patient Advocate Encounter  Received notification from [facility] that Prior Authorization for  Zepbound  15MG /0.5ML pen-injectors  has been APPROVED from 02/27/24 to 02/26/25. Ran test claim, Copay is $24.99. This test claim was processed through [facility]- copay amounts may vary at other pharmacies due to pharmacy/plan contracts, or as the patient moves through the different stages of their insurance plan.   PA #/Case ID/Reference #: [id number]

## 2024-02-29 ENCOUNTER — Other Ambulatory Visit (HOSPITAL_COMMUNITY): Payer: Self-pay

## 2024-04-03 DIAGNOSIS — Z961 Presence of intraocular lens: Secondary | ICD-10-CM | POA: Diagnosis not present

## 2024-04-16 ENCOUNTER — Encounter: Payer: Self-pay | Admitting: Family Medicine

## 2024-04-16 ENCOUNTER — Ambulatory Visit: Payer: Self-pay | Admitting: Family Medicine

## 2024-04-16 VITALS — BP 116/72 | HR 68 | Ht 68.0 in | Wt 172.0 lb

## 2024-04-16 DIAGNOSIS — E785 Hyperlipidemia, unspecified: Secondary | ICD-10-CM | POA: Diagnosis not present

## 2024-04-16 DIAGNOSIS — I2510 Atherosclerotic heart disease of native coronary artery without angina pectoris: Secondary | ICD-10-CM | POA: Diagnosis not present

## 2024-04-16 DIAGNOSIS — Z0001 Encounter for general adult medical examination with abnormal findings: Secondary | ICD-10-CM | POA: Diagnosis not present

## 2024-04-16 DIAGNOSIS — Z0000 Encounter for general adult medical examination without abnormal findings: Secondary | ICD-10-CM | POA: Diagnosis not present

## 2024-04-16 DIAGNOSIS — I10 Essential (primary) hypertension: Secondary | ICD-10-CM

## 2024-04-16 DIAGNOSIS — Z23 Encounter for immunization: Secondary | ICD-10-CM

## 2024-04-16 DIAGNOSIS — E669 Obesity, unspecified: Secondary | ICD-10-CM

## 2024-04-16 DIAGNOSIS — J069 Acute upper respiratory infection, unspecified: Secondary | ICD-10-CM

## 2024-04-16 DIAGNOSIS — G4733 Obstructive sleep apnea (adult) (pediatric): Secondary | ICD-10-CM

## 2024-04-16 LAB — LIPID PANEL

## 2024-04-16 MED ORDER — ZEPBOUND 15 MG/0.5ML ~~LOC~~ SOAJ: 15.0000 mg | SUBCUTANEOUS | 3 refills | Status: AC

## 2024-04-16 NOTE — Progress Notes (Signed)
 BP 116/72   Pulse 68   Ht 5' 8 (1.727 m)   Wt 172 lb (78 kg)   SpO2 97%   BMI 26.15 kg/m    Subjective:   Patient ID: Jonathan Burke, male    DOB: 05/20/1961, 63 y.o.   MRN: [id number]  HPI: Jonathan Burke is a 63 y.o. male presenting on 04/16/2024 for Medical Management of Chronic Issues (CPE), Hyperlipidemia, and Hypertension   Discussed the use of AI scribe software for clinical note transcription with the patient, who gave verbal consent to proceed.  History of Present Illness   Jonathan Burke is a 63 year old male who presents with symptoms of a cold.  Upper respiratory symptoms - Congestion and sore throat present for the past two weeks - No sinus pressure - Post-nasal drainage down the back of the throat - Started Mucinex on Saturday without significant improvement; has not taken it for the past two days - Started taking Airborne, a vitamin supplement  Gastroesophageal reflux symptoms - Continues pantoprazole  for heartburn - Heartburn symptoms are effectively managed  Hyperlipidemia management - Takes a daily cholesterol medication  Obesity pharmacotherapy - Takes Zepbound  15 mg daily - Tolerates Zepbound  well without insurance issues          Relevant past medical, surgical, family and social history reviewed and updated as indicated. Interim medical history since our last visit reviewed. Allergies and medications reviewed and updated.  Review of Systems  Constitutional:  Negative for chills and fever.  HENT:  Positive for congestion and voice change. Negative for ear pain and tinnitus.   Eyes:  Negative for pain.  Respiratory:  Positive for cough. Negative for shortness of breath and wheezing.   Cardiovascular:  Negative for chest pain, palpitations and leg swelling.  Gastrointestinal:  Negative for abdominal pain, blood in stool, constipation and diarrhea.  Genitourinary:  Negative for dysuria and hematuria.  Musculoskeletal:  Negative for back pain,  gait problem and myalgias.  Skin:  Negative for rash.  Neurological:  Negative for dizziness, weakness and headaches.  Psychiatric/Behavioral:  Negative for suicidal ideas.   All other systems reviewed and are negative.   Per HPI unless specifically indicated above   Allergies as of 04/16/2024       Reactions   Adhesive [tape] Other (See Comments)   Regular tape pulls OFF the skin!! Only paper tape is tolerated.   Quinolones Other (See Comments)   Patient has an ATAA (ascending thoracic aortic aneurysm) and should avoid this class of antibiotics to avoid increased risk of dissection   Atorvastatin  Other (See Comments)   myalgia        Medication List        Accurate as of April 16, 2024 10:34 AM. If you have any questions, ask your nurse or doctor.          STOP taking these medications    Percocet 5-325 MG tablet Generic drug: oxyCODONE -acetaminophen  Stopped by: Fonda LABOR Miette Molenda       TAKE these medications    Acetaminophen  500 MG capsule Take 500-1,000 mg by mouth every 6 (six) hours as needed for pain (or headaches).   ascorbic acid 500 MG tablet Commonly known as: VITAMIN C Take 500 mg by mouth daily.   aspirin EC 81 MG tablet Take 81 mg by mouth daily.   cyanocobalamin  500 MCG tablet Commonly known as: VITAMIN B12 Take 500 mcg by mouth daily.   pantoprazole  20 MG tablet Commonly known  as: PROTONIX  TAKE ONE (1) TABLET BY MOUTH TWO (2) TIMES DAILY   pravastatin  20 MG tablet Commonly known as: PRAVACHOL  TAKE ONE (1) TABLET BY MOUTH EVERY DAY   tadalafil  5 MG tablet Commonly known as: CIALIS  Take 5 mg by mouth daily at 12 noon.   Vitamin D3 50 MCG (2000 UT) Tabs Take 2,000 Units by mouth daily.   Zepbound  15 MG/0.5ML Pen Generic drug: tirzepatide  Inject 15 mg into the skin once a week.         Objective:   BP 116/72   Pulse 68   Ht 5' 8 (1.727 m)   Wt 172 lb (78 kg)   SpO2 97%   BMI 26.15 kg/m   Wt Readings from Last 3  Encounters:  04/16/24 172 lb (78 kg)  12/13/23 174 lb 6.1 oz (79.1 kg)  11/09/23 185 lb (83.9 kg)    Physical Exam Physical Exam   HEENT: Postnasal drainage noted in throat. NECK: Thyroid  normal, no masses. No cervical lymphadenopathy. CHEST: Lungs clear to auscultation bilaterally. CARDIOVASCULAR: Regular rate and rhythm, no murmurs. ABDOMEN: No costovertebral angle tenderness. EXTREMITIES: No edema in extremities.         Assessment & Plan:   Problem List Items Addressed This Visit       Cardiovascular and Mediastinum   Essential hypertension   Relevant Orders   CBC with Differential/Platelet   CMP14+EGFR   Lipid panel   PSA, total and free   Coronary artery disease involving native coronary artery of native heart without angina pectoris   Relevant Orders   CBC with Differential/Platelet   CMP14+EGFR   Lipid panel   PSA, total and free     Respiratory   OSA (obstructive sleep apnea)   Relevant Medications   tirzepatide  (ZEPBOUND ) 15 MG/0.5ML Pen     Other   Dyslipidemia (high LDL; low HDL)   Relevant Orders   CBC with Differential/Platelet   CMP14+EGFR   Lipid panel   PSA, total and free   Obesity (BMI 30-39.9)   Relevant Medications   tirzepatide  (ZEPBOUND ) 15 MG/0.5ML Pen   Other Visit Diagnoses       Physical exam    -  Primary   Relevant Orders   CBC with Differential/Platelet   CMP14+EGFR   Lipid panel   PSA, total and free     Upper respiratory tract infection, unspecified type                Adult Wellness Visit Routine visit with stable health status. - Performed blood work. - Scheduled follow-up in six months.  Essential hypertension Blood pressure well-controlled.  Hyperlipidemia Stable on current medication. - Checked cholesterol levels with blood work.  Atherosclerotic heart disease of native coronary artery without angina Stable without angina.  Obesity Weight well-managed on Zepbound  15 mg. - Continue Zepbound  15  mg.  Gastroesophageal reflux disease Symptoms controlled with pantoprazole . - Continue pantoprazole .  Acute upper respiratory infection Congestion and throat discomfort for two weeks, post-nasal drainage noted. - Continue Mucinex. - Add Benadryl  at nighttime. - Perform salt water gargles. - Monitor symptoms and report if no improvement.          Follow up plan: Return in about 6 months (around 10/14/2024), or if symptoms worsen or fail to improve, for Hyperlipidemia recheck.  Counseling provided for all of the vaccine components Orders Placed This Encounter  Procedures   CBC with Differential/Platelet   CMP14+EGFR   Lipid panel   PSA, total and  free    Fonda Levins, MD [place] [facility] 04/16/2024, 10:34 AM

## 2024-04-17 LAB — CMP14+EGFR
ALT: 15 IU/L (ref 0–44)
AST: 15 IU/L (ref 0–40)
Albumin: 4.2 g/dL (ref 3.9–4.9)
Alkaline Phosphatase: 58 IU/L (ref 47–123)
BUN/Creatinine Ratio: 21 (ref 10–24)
BUN: 17 mg/dL (ref 8–27)
Bilirubin Total: 0.7 mg/dL (ref 0.0–1.2)
CO2: 23 mmol/L (ref 20–29)
Calcium: 9.5 mg/dL (ref 8.6–10.2)
Chloride: 105 mmol/L (ref 96–106)
Creatinine, Ser: 0.81 mg/dL (ref 0.76–1.27)
Globulin, Total: 3 g/dL (ref 1.5–4.5)
Glucose: 86 mg/dL (ref 70–99)
Potassium: 4.5 mmol/L (ref 3.5–5.2)
Sodium: 139 mmol/L (ref 134–144)
Total Protein: 7.2 g/dL (ref 6.0–8.5)
eGFR: 99 mL/min/1.73 (ref >59)

## 2024-04-17 LAB — LIPID PANEL
Cholesterol, Total: 117 mg/dL (ref 100–199)
HDL: 47 mg/dL (ref >39)
LDL CALC COMMENT:: 2.5 ratio (ref 0.0–5.0)
LDL Chol Calc (NIH): 59 mg/dL (ref 0–99)
Triglycerides: 44 mg/dL (ref 0–149)
VLDL Cholesterol Cal: 11 mg/dL (ref 5–40)

## 2024-04-17 LAB — CBC WITH DIFFERENTIAL/PLATELET
Basophils Absolute: 0 x10E3/uL (ref 0.0–0.2)
Basos: 0 %
EOS (ABSOLUTE): 0.1 x10E3/uL (ref 0.0–0.4)
Eos: 2 %
Hematocrit: 42.4 % (ref 37.5–51.0)
Hemoglobin: 14.5 g/dL (ref 13.0–17.7)
Immature Grans (Abs): 0 x10E3/uL (ref 0.0–0.1)
Immature Granulocytes: 0 %
Lymphocytes Absolute: 1 x10E3/uL (ref 0.7–3.1)
Lymphs: 13 %
MCH: 32 pg (ref 26.6–33.0)
MCHC: 34.2 g/dL (ref 31.5–35.7)
MCV: 94 fL (ref 79–97)
Monocytes Absolute: 0.6 x10E3/uL (ref 0.1–0.9)
Monocytes: 8 %
Neutrophils Absolute: 6.3 x10E3/uL (ref 1.4–7.0)
Neutrophils: 77 %
Platelets: 262 x10E3/uL (ref 150–450)
RBC: 4.53 x10E6/uL (ref 4.14–5.80)
RDW: 12.1 % (ref 11.6–15.4)
WBC: 8.1 x10E3/uL (ref 3.4–10.8)

## 2024-04-17 LAB — PSA, TOTAL AND FREE
PSA, Free Pct: 40 %
PSA, Free: 0.16 ng/mL
Prostate Specific Ag, Serum: 0.4 ng/mL (ref 0.0–4.0)

## 2024-04-19 ENCOUNTER — Ambulatory Visit: Payer: Self-pay | Admitting: Family Medicine

## 2024-04-19 ENCOUNTER — Telehealth: Payer: Self-pay | Admitting: Family Medicine

## 2024-04-19 NOTE — Telephone Encounter (Signed)
 Patient's bloody nose is probably just from his sinus and the congestion that he has been having.  He can use nasal saline washes or nasal irrigation to help moisturize it and if it worsens and he feels like he needs something to stop it because it will not stop bleeding we can put the Rhino Rocket's in

## 2024-04-19 NOTE — Telephone Encounter (Signed)
 Copied from CRM [id number]. Topic: Clinical - Medical Advice >> Apr 19, 2024  9:42 AM Delon DASEN wrote: Reason for CRM: Patient was just seen in office on Monday, now having nose bleeds. Please call [phone] or [phone]

## 2024-04-19 NOTE — Telephone Encounter (Signed)
 Pt made aware of Dr. Williemae recommendations. He will call back next week if no better. Sx's have been ongoing for about 2w.

## 2024-06-19 ENCOUNTER — Ambulatory Visit: Admitting: Cardiovascular Disease

## 2024-06-19 ENCOUNTER — Encounter: Payer: Self-pay | Admitting: Cardiovascular Disease

## 2024-06-19 VITALS — BP 103/67 | HR 75 | Ht 68.0 in | Wt 175.0 lb

## 2024-06-19 DIAGNOSIS — I7121 Aneurysm of the ascending aorta, without rupture: Secondary | ICD-10-CM | POA: Diagnosis not present

## 2024-06-19 DIAGNOSIS — E785 Hyperlipidemia, unspecified: Secondary | ICD-10-CM

## 2024-06-19 DIAGNOSIS — I2510 Atherosclerotic heart disease of native coronary artery without angina pectoris: Secondary | ICD-10-CM

## 2024-06-19 DIAGNOSIS — I10 Essential (primary) hypertension: Secondary | ICD-10-CM | POA: Diagnosis not present

## 2024-06-19 DIAGNOSIS — Q2381 Bicuspid aortic valve: Secondary | ICD-10-CM | POA: Diagnosis not present

## 2024-06-19 NOTE — Progress Notes (Signed)
 "    Cardiology Office Note   Date:  06/19/2024   ID:  Jonathan Burke, Jonathan Burke 28-Aug-1960, MRN [id number]  PCP:  Dettinger, Fonda LABOR, MD  Cardiologist:   Deatrice Cage, MD   No chief complaint on file.      History of Present Illness: Jonathan Burke is a 64 y.o. male who presents for a follow-up visit regarding mild coronary atherosclerosis, bicuspid aortic valve and ascending aortic aneurysm. He has known history of essential hypertension.  There is family history of sudden death involving his father and brother. He had cardiac evaluation in February,2020 with CTA of the coronary arteries.  Coronary calcium  score was 74.  There was mild nonobstructive LAD disease that was not significant by FFR.  There was incidental finding of ascending aortic aneurysm measured 4.5 cm.   The notes indicate that he underwent genetic testing for aortopathy which was negative.  Echocardiogram showed an EF was 50 to 55%.  Mild aortic valve calcifications were noted. He is not a smoker.  He did not tolerate rosuvastatin  or atorvastatin  due to severe myalgia.  He is currently on pravastatin .    A transesophageal echocardiogram was done in June 2021 which showed an EF of 60 to 65%, bicuspid aortic valve with fusion of the right and left coronary cusps with mild aortic valve calcifications without significant stenosis.  The ascending aorta measured 48 mm.  The patient has been following with Dr. Lucas on a yearly basis with stable size thoracic aortic aneurysm around 4.5 cm.  Lexiscan  Myoview  in July 2022 showed no evidence of ischemia with normal ejection fraction.  He lost significant weight with Zepbound .  He had chest pain in March 2025.  Repeat cardiac CTA showed mild nonobstructive disease with stable size ascending aorta at 4.5 cm.  Calcium  score was 121.  Echocardiogram showed normal LV systolic function with known bicuspid aortic valve and mild aortic stenosis. He was hospitalized in July with small bowel  obstruction which improved with NG tube and conservative management overall.  He had nausea and vomiting over the weekend but did not go to the hospital.  Symptoms resolved without intervention.  He has history of recurrent small bowel obstruction related to previous appendectomy and cholecystectomy.  He has been stable from a cardiac standpoint with no chest pain, shortness of breath or palpitations.  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   Chest pain    Complication of anesthesia    states is hard to wake up   Dental crowns present    also caps   Dilated aortic root    Enlarged prostate    GERD (gastroesophageal reflux disease)    daily OTC   H/O hiatal hernia    pt unsure if hernia or not, but has a knot in his stomach   Headache(784.0)    tension   PONV (postoperative nausea and vomiting)    Sleep apnea    pt non compliant with CPAP usage   Small bowel obstruction (HCC) 07/17/2012    Past Surgical History:  Procedure Laterality Date   APPENDECTOMY     CARPAL TUNNEL RELEASE  09/09/2011   Procedure: CARPAL TUNNEL RELEASE;  Surgeon: Lamar LULLA Leonor Mickey., MD;  Location: [facility];  Service: Orthopedics;  Laterality: Left;   CHOLECYSTECTOMY     DIRECT LARYNGOSCOPY  07/07/2001   suspension microdirect laryngoscopy  with exc. left vocal cord mass   ESOPHAGOGASTRODUODENOSCOPY N/A 06/05/2013   Procedure: ESOPHAGOGASTRODUODENOSCOPY (EGD);  Surgeon: Lynwood LITTIE Celestia Mickey., MD;  Location: [facility];  Service: Endoscopy;  Laterality: N/A;   LAPAROSCOPIC LYSIS OF ADHESIONS N/A 05/29/2013   Procedure: DIAGNOSTIC LAPAROSCOPY CONVERTED TO EXPLORATORY LAPAROTOMY, LYSIS OF ADHESIONS ;  Surgeon: Vicenta DELENA Poli, MD;  Location: [facility] OR;  Service: General;  Laterality: N/A;   LUMBAR LAMINECTOMY/DECOMPRESSION MICRODISCECTOMY  10/29/1999   L5-S1   REPLACEMENT TOTAL KNEE Right 2020   SHOULDER  SURGERY     left   TEE WITHOUT CARDIOVERSION N/A 11/05/2019   Procedure: TRANSESOPHAGEAL ECHOCARDIOGRAM (TEE);  Surgeon: Darron Deatrice DELENA, MD;  Location: [facility];  Service: Cardiovascular;  Laterality: N/A;     Current Outpatient Medications  Medication Sig Dispense Refill   aspirin EC 81 MG tablet Take 81 mg by mouth daily.     Cholecalciferol (VITAMIN D3) 50 MCG (2000 UT) TABS Take 2,000 Units by mouth daily.     tadalafil  (CIALIS ) 5 MG tablet Take 5 mg by mouth daily at 12 noon.     vitamin B-12 (CYANOCOBALAMIN ) 500 MCG tablet Take 500 mcg by mouth daily.     vitamin C (ASCORBIC ACID) 500 MG tablet Take 500 mg by mouth daily.     Acetaminophen  500 MG capsule Take 500-1,000 mg by mouth every 6 (six) hours as needed for pain (or headaches). (Patient not taking: Reported on 06/19/2024)     pantoprazole  (PROTONIX ) 20 MG tablet TAKE ONE (1) TABLET BY MOUTH TWO (2) TIMES DAILY 60 tablet 6   pravastatin  (PRAVACHOL ) 20 MG tablet TAKE ONE (1) TABLET BY MOUTH EVERY DAY 90 tablet 3   tirzepatide  (ZEPBOUND ) 15 MG/0.5ML Pen Inject 15 mg into the skin once a week. 6 mL 3   No current facility-administered medications for this visit.    Allergies:   Adhesive [tape], Quinolones, and Atorvastatin     Social History:  The patient  reports that he has never smoked. He has never used smokeless tobacco. He reports that he does not drink alcohol and does not use drugs.   Family History:  The patient's family history includes Anuerysm in his sister; Arthritis in his sister and sister; COPD in his mother; Heart attack in his father; Heart disease in his mother; Hypertension in his brother and sister; Rectal cancer in his mother; Stroke in his sister.    ROS:  Please see the history of present illness.   Otherwise, review of systems are positive for none.   All other systems are reviewed and negative.    PHYSICAL EXAM: VS:  BP 103/67 (BP Location: Left Arm, Patient Position: Sitting, Cuff Size: Normal)    Pulse 75   Ht 5' 8 (1.727 m)   Wt 175 lb (79.4 kg)   SpO2 97%   BMI 26.61 kg/m  , BMI Body mass index is 26.61 kg/m. GEN: Well nourished, well developed, in no acute distress  HEENT: normal  Neck: no JVD, carotid bruits, or masses Cardiac: RRR; no murmurs, rubs, or gallops,no edema  Respiratory:  clear to auscultation bilaterally, normal work of breathing GI: soft, nontender, nondistended, + BS MS: no deformity or atrophy  Skin: warm and dry, no rash Neuro:  Strength and sensation are intact Psych: euthymic mood, full affect   EKG:  EKG is ordered today. EKG showed: Normal sinus rhythm Normal ECG When compared with ECG of 13-Dec-2023 04:52, No significant change was found    Recent Labs: 12/14/2023:  Magnesium 2.0 04/16/2024: ALT 15; BUN 17; Creatinine, Ser 0.81; Hemoglobin 14.5; Platelets 262; Potassium 4.5; Sodium 139    Lipid Panel    Component Value Date/Time   CHOL 117 04/16/2024 1034   CHOL 155 09/05/2012 0925   TRIG 44 04/16/2024 1034   TRIG 68 09/05/2012 0925   HDL 47 04/16/2024 1034   HDL 43 09/05/2012 0925   CHOLHDL 2.5 04/16/2024 1034   LDLCALC 59 04/16/2024 1034   LDLCALC 98 09/05/2012 0925      Wt Readings from Last 3 Encounters:  06/19/24 175 lb (79.4 kg)  04/16/24 172 lb (78 kg)  12/13/23 174 lb 6.1 oz (79.1 kg)           No data to display            ASSESSMENT AND PLAN:  1.  Mild nonobstructive coronary artery disease: Mildly elevated calcium  score with mild plaque overall.  Continue medical therapy.  2.  Ascending aortic aneurysm: Most recent CTA showed stable size at 4.5 cm .  He is followed by Dr. Lucas on a yearly basis.  3. Essential hypertension: His blood pressure normalized with weight loss and thus he is off all antihypertensive medications.  4. Hyperlipidemia: He did not tolerate atorvastatin  or rosuvastatin  in the past due to myalgia but he is tolerating pravastatin  which will be continued.  Most recent lipid  profile showed an LDL of 59.  5.  Bicuspid aortic valve: Most recent echo showed mild aortic stenosis.  Repeat echocardiogram in 1 year.  6.  Obesity: He lost significant amount of weight with Zepbound .    Disposition:   FU  in 12 months  Signed,  Deatrice Cage, MD  06/19/2024 10:46 AM    [facility] "

## 2024-06-19 NOTE — Patient Instructions (Signed)
 Medication Instructions:  No changes *If you need a refill on your cardiac medications before your next appointment, please call your pharmacy*  Lab Work: None ordered If you have labs (blood work) drawn today and your tests are completely normal, you will receive your results only by: MyChart Message (if you have MyChart) OR A paper copy in the mail If you have any lab test that is abnormal or we need to change your treatment, we will call you to review the results.  Testing/Procedures: Your physician has requested that you have an echocardiogram in 12 months. Echocardiography is a painless test that uses sound waves to create images of your heart. It provides your doctor with information about the size and shape of your heart and how well your hearts chambers and valves are working. You may receive an ultrasound enhancing agent through an IV if needed to better visualize your heart during the echo.This procedure takes approximately one hour. There are no restrictions for this procedure.  This will take place at [address] [facility], 2nd floor  Please note: We ask at that you not bring children with you during ultrasound (echo/ vascular) testing. Due to room size and safety concerns, children are not allowed in the ultrasound rooms during exams. Our front office staff cannot provide observation of children in our lobby area while testing is being conducted. An adult accompanying a patient to their appointment will only be allowed in the ultrasound room at the discretion of the ultrasound technician under special circumstances. We apologize for any inconvenience.   Follow-Up: At [facility], you and your health needs are our priority.  As part of our continuing mission to provide you with exceptional heart care, our providers are all part of one team.  This team includes your primary Cardiologist (physician) and Advanced Practice Providers or APPs (Physician Assistants and Nurse  Practitioners) who all work together to provide you with the care you need, when you need it.  Your next appointment:   12 month(s)  Provider:   Deatrice Cage, MD    We recommend signing up for the patient portal called MyChart.  Sign up information is provided on this After Visit Summary.  MyChart is used to connect with patients for Virtual Visits (Telemedicine).  Patients are able to view lab/test results, encounter notes, upcoming appointments, etc.  Non-urgent messages can be sent to your provider as well.   To learn more about what you can do with MyChart, go to [email or web address].

## 2024-06-28 ENCOUNTER — Other Ambulatory Visit: Payer: Self-pay | Admitting: Cardiovascular Disease

## 2024-07-03 MED ORDER — PANTOPRAZOLE SODIUM 20 MG PO TBEC: 20.0000 mg | DELAYED_RELEASE_TABLET | Freq: Two times a day (BID) | ORAL | 6 refills | Status: AC

## 2024-10-17 ENCOUNTER — Ambulatory Visit: Admitting: Family Medicine
